# Patient Record
Sex: Male | Born: 1955 | Race: White | Hispanic: No | Marital: Married | Smoking: Former smoker
Health system: Southern US, Community
[De-identification: ages and names within clinical notes are randomized; demographics above are authoritative.]

## PROBLEM LIST (undated history)

## (undated) DIAGNOSIS — I350 Nonrheumatic aortic (valve) stenosis: Secondary | ICD-10-CM

## (undated) DIAGNOSIS — E785 Hyperlipidemia, unspecified: Secondary | ICD-10-CM

## (undated) DIAGNOSIS — M51369 Other intervertebral disc degeneration, lumbar region without mention of lumbar back pain or lower extremity pain: Secondary | ICD-10-CM

## (undated) DIAGNOSIS — I209 Angina pectoris, unspecified: Secondary | ICD-10-CM

## (undated) DIAGNOSIS — Z7982 Long term (current) use of aspirin: Secondary | ICD-10-CM

## (undated) DIAGNOSIS — E119 Type 2 diabetes mellitus without complications: Secondary | ICD-10-CM

## (undated) DIAGNOSIS — I219 Acute myocardial infarction, unspecified: Secondary | ICD-10-CM

## (undated) DIAGNOSIS — K439 Ventral hernia without obstruction or gangrene: Secondary | ICD-10-CM

## (undated) DIAGNOSIS — M5136 Other intervertebral disc degeneration, lumbar region: Secondary | ICD-10-CM

## (undated) DIAGNOSIS — G4733 Obstructive sleep apnea (adult) (pediatric): Secondary | ICD-10-CM

## (undated) DIAGNOSIS — I2510 Atherosclerotic heart disease of native coronary artery without angina pectoris: Secondary | ICD-10-CM

## (undated) DIAGNOSIS — I5189 Other ill-defined heart diseases: Secondary | ICD-10-CM

## (undated) DIAGNOSIS — I10 Essential (primary) hypertension: Secondary | ICD-10-CM

## (undated) DIAGNOSIS — F32A Depression, unspecified: Secondary | ICD-10-CM

## (undated) DIAGNOSIS — K219 Gastro-esophageal reflux disease without esophagitis: Secondary | ICD-10-CM

## (undated) DIAGNOSIS — M1990 Unspecified osteoarthritis, unspecified site: Secondary | ICD-10-CM

## (undated) DIAGNOSIS — N200 Calculus of kidney: Secondary | ICD-10-CM

## (undated) DIAGNOSIS — F419 Anxiety disorder, unspecified: Secondary | ICD-10-CM

## (undated) DIAGNOSIS — K5790 Diverticulosis of intestine, part unspecified, without perforation or abscess without bleeding: Secondary | ICD-10-CM

## (undated) DIAGNOSIS — Z9889 Other specified postprocedural states: Secondary | ICD-10-CM

## (undated) DIAGNOSIS — I700 Atherosclerosis of aorta: Secondary | ICD-10-CM

## (undated) DIAGNOSIS — Z973 Presence of spectacles and contact lenses: Secondary | ICD-10-CM

## (undated) DIAGNOSIS — K760 Fatty (change of) liver, not elsewhere classified: Secondary | ICD-10-CM

## (undated) DIAGNOSIS — R911 Solitary pulmonary nodule: Secondary | ICD-10-CM

## (undated) HISTORY — DX: Anxiety disorder, unspecified: F41.9

## (undated) HISTORY — DX: Type 2 diabetes mellitus without complications: E11.9

## (undated) HISTORY — PX: BACK SURGERY: SHX140

## (undated) HISTORY — DX: Atherosclerotic heart disease of native coronary artery without angina pectoris: I25.10

## (undated) HISTORY — DX: Other specified postprocedural states: Z98.890

## (undated) HISTORY — DX: Hyperlipidemia, unspecified: E78.5

## (undated) HISTORY — DX: Essential (primary) hypertension: I10

---

## 1976-01-13 HISTORY — PX: KNEE SURGERY: SHX244

## 1998-03-18 ENCOUNTER — Observation Stay (HOSPITAL_COMMUNITY): Admission: RE | Admit: 1998-03-18 | Discharge: 1998-03-19 | Payer: Self-pay | Admitting: Orthopedic Surgery

## 2001-01-12 DIAGNOSIS — Z9889 Other specified postprocedural states: Secondary | ICD-10-CM

## 2001-01-12 HISTORY — DX: Other specified postprocedural states: Z98.890

## 2001-01-12 HISTORY — PX: ANKLE FUSION: SHX881

## 2001-01-12 HISTORY — PX: CARDIAC CATHETERIZATION: SHX172

## 2001-03-03 HISTORY — PX: LEFT HEART CATH AND CORONARY ANGIOGRAPHY: CATH118249

## 2001-07-26 ENCOUNTER — Encounter: Payer: Self-pay | Admitting: Orthopedic Surgery

## 2001-07-27 ENCOUNTER — Inpatient Hospital Stay (HOSPITAL_COMMUNITY): Admission: EM | Admit: 2001-07-27 | Discharge: 2001-07-28 | Payer: Self-pay | Admitting: Orthopedic Surgery

## 2004-01-13 HISTORY — PX: CORONARY ANGIOPLASTY: SHX604

## 2004-11-19 DIAGNOSIS — Z955 Presence of coronary angioplasty implant and graft: Secondary | ICD-10-CM

## 2004-11-19 DIAGNOSIS — I2510 Atherosclerotic heart disease of native coronary artery without angina pectoris: Secondary | ICD-10-CM

## 2004-11-19 HISTORY — DX: Atherosclerotic heart disease of native coronary artery without angina pectoris: I25.10

## 2004-11-19 HISTORY — DX: Presence of coronary angioplasty implant and graft: Z95.5

## 2004-11-19 HISTORY — PX: CORONARY ANGIOPLASTY WITH STENT PLACEMENT: SHX49

## 2004-12-01 HISTORY — PX: CORONARY ANGIOPLASTY WITH STENT PLACEMENT: SHX49

## 2008-01-13 HISTORY — PX: NECK SURGERY: SHX720

## 2012-02-09 ENCOUNTER — Encounter: Payer: Self-pay | Admitting: Cardiovascular Disease

## 2012-02-09 ENCOUNTER — Ambulatory Visit (INDEPENDENT_AMBULATORY_CARE_PROVIDER_SITE_OTHER): Payer: BC Managed Care – PPO | Admitting: Cardiovascular Disease

## 2012-02-09 VITALS — BP 130/80 | HR 61 | Ht 71.5 in | Wt 205.0 lb

## 2012-02-09 DIAGNOSIS — Z9861 Coronary angioplasty status: Secondary | ICD-10-CM

## 2012-02-09 DIAGNOSIS — E119 Type 2 diabetes mellitus without complications: Secondary | ICD-10-CM | POA: Insufficient documentation

## 2012-02-09 DIAGNOSIS — E785 Hyperlipidemia, unspecified: Secondary | ICD-10-CM

## 2012-02-09 DIAGNOSIS — R079 Chest pain, unspecified: Secondary | ICD-10-CM | POA: Insufficient documentation

## 2012-02-09 DIAGNOSIS — I2510 Atherosclerotic heart disease of native coronary artery without angina pectoris: Secondary | ICD-10-CM | POA: Insufficient documentation

## 2012-02-09 DIAGNOSIS — Z955 Presence of coronary angioplasty implant and graft: Secondary | ICD-10-CM

## 2012-02-09 NOTE — Assessment & Plan Note (Signed)
Continue Crestor, goal LDL less than 70

## 2012-02-09 NOTE — Progress Notes (Signed)
Patient ID: Samuel Gentry, male    DOB: 06/12/1955, 57 y.o.   MRN: [id number]  HPI Comments: Samuel Gentry is a very pleasant 89 your gentleman with history of hypertension, diabetes , coronary artery disease, cardiac catheterization in 2003 showing 50% LAD disease, 50% left circumflex disease with medical management recommended at that time based on stress test showing no ischemia (following the cardiac catheterization), repeat cardiac catheterization in 2006 for symptoms of chest pain showing severe LAD, diagonal, circumflex and OM disease. He had stent x4 at [facility]. He presents to establish care  He reports having several weeks, possibly several months of stuttering chest pain, shortness of breath, dizziness. Dizzy episode started back in Thanksgiving 2013. Symptoms while standing lasting for several minutes. Occasionally he has "swimmy feeling" in his head. He has had chest pain recently. Typically he likes to exercise on a daily basis. Chest pain episodes over the past week or 2, especially today after walking the chest pain that did not resolve as quickly as it normally does. He almost took nitroglycerin for symptoms.  He reports that his diabetes is well controlled, cholesterol also well controlled EKG today shows normal sinus rhythm with rate 61 beats a minute, no significant ST or T wave changes       Outpatient Encounter Prescriptions as of 02/09/2012  Medication Sig Dispense Refill  . amLODipine (NORVASC) 10 MG tablet Take 10 mg by mouth daily.      Marland Kitchen aspirin 325 MG tablet Take 325 mg by mouth daily.      Marland Kitchen atenolol (TENORMIN) 50 MG tablet Take 50 mg by mouth daily.      . Multiple Vitamin (MULTIVITAMIN) tablet Take 1 tablet by mouth daily.      . nitroGLYCERIN (NITROSTAT) 0.4 MG SL tablet Place 0.4 mg under the tongue every 5 (five) minutes as needed.      . rosuvastatin (CRESTOR) 40 MG tablet Take 20 mg by mouth daily.       . sitaGLIPtan-metformin (JANUMET)  50-1000 MG per tablet Take 1 tablet by mouth 2 (two) times daily with a meal.      . isosorbide dinitrate (ISORDIL) 20 MG tablet Take 20 mg by mouth 2 (two) times daily.        Review of Systems  Constitutional: Negative.   HENT: Negative.   Eyes: Negative.   Respiratory: Positive for shortness of breath.   Cardiovascular: Positive for chest pain.  Gastrointestinal: Negative.   Musculoskeletal: Negative.   Skin: Negative.   Neurological: Positive for dizziness.  Hematological: Negative.   Psychiatric/Behavioral: Negative.   All other systems reviewed and are negative.     BP 130/80  Pulse 61  Ht 5' 11.5" (1.816 m)  Wt 205 lb (92.987 kg)  BMI 28.19 kg/m2  Physical Exam  Nursing note and vitals reviewed. Constitutional: He is oriented to person, place, and time. He appears well-developed and well-nourished.  HENT:  Head: Normocephalic.  Nose: Nose normal.  Mouth/Throat: Oropharynx is clear and moist.  Eyes: Conjunctivae normal are normal. Pupils are equal, round, and reactive to light.  Neck: Normal range of motion. Neck supple. No JVD present.  Cardiovascular: Normal rate, regular rhythm, S1 normal, S2 normal, normal heart sounds and intact distal pulses.  Exam reveals no gallop and no friction rub.   No murmur heard. Pulmonary/Chest: Effort normal and breath sounds normal. No respiratory distress. He has no wheezes. He has no rales. He exhibits no tenderness.  Abdominal: Soft.  Bowel sounds are normal. He exhibits no distension. There is no tenderness.  Musculoskeletal: Normal range of motion. He exhibits no edema and no tenderness.  Lymphadenopathy:    He has no cervical adenopathy.  Neurological: He is alert and oriented to person, place, and time. Coordination normal.  Skin: Skin is warm and dry. No rash noted. No erythema.  Psychiatric: He has a normal mood and affect. His behavior is normal. Judgment and thought content normal.           Assessment and Plan

## 2012-02-09 NOTE — Assessment & Plan Note (Addendum)
Chest pain concerning for angina. He is on aspirin, statin, beta blocker. He has headaches with long acting isosorbide and has not started this yet. He would take nitroglycerin sublingual as needed. Stress test pending.

## 2012-02-09 NOTE — Assessment & Plan Note (Signed)
We have encouraged continued exercise, careful diet management in an effort to lose weight. 

## 2012-02-09 NOTE — Assessment & Plan Note (Addendum)
Coronary disease dating back to catheterization in 2003, stent placement in 2006. Now with chest pain, neck pain concerning for unstable angina. We have discussed the various options with him. He would like to discuss this with his wife but we'll schedule a stress test. This is what he would prefer at this time. We have suggested he use nitroglycerin for any worsening chest pain. Stress test will be on February 5

## 2012-02-09 NOTE — Patient Instructions (Addendum)
No medication changes were made.  We will schedule a stress test on 02/17/2012 at 11:15 Am Please hold amlodipine, janumet, and atenolol the night before and morning of the test  Call the office if you have worsening chest pain  Please call us if you have new issues that need to be addressed before your next appt.

## 2012-02-09 NOTE — Assessment & Plan Note (Addendum)
Images that he brings with him shows 2 stents to the LAD and diagonal, 2 stents to the left circumflex and OM in 2006

## 2012-02-12 ENCOUNTER — Other Ambulatory Visit: Payer: Self-pay

## 2012-02-12 DIAGNOSIS — R079 Chest pain, unspecified: Secondary | ICD-10-CM

## 2012-02-17 ENCOUNTER — Telehealth: Payer: Self-pay

## 2012-02-17 NOTE — Telephone Encounter (Signed)
LMTCB

## 2012-02-17 NOTE — Telephone Encounter (Signed)
Wife called very upset about this issue, stating "if my husband has a heart attack, there WILL BE a lawsuit headed your way!"  I explained that I understood her frustration and would be upset as well.  She says "something needs to change" and continues to tell me how furious she is.  i told her I have tried calling pt but had to leave a message for him to call me back. I told her I would try calling him again.

## 2012-02-17 NOTE — Telephone Encounter (Signed)
I was able to speak with pt, who says he is willing to r/s test until tomm at 1115-1:30. I apologized again for this issue and inconvenience.  I offered to r/s at [facility]. He wishes to keep his care with Dr. Mariah Milling and [facility]. R/S stress test for tomm at 1130, pt check in time at 1115 I advised him to hold janumet tomm am as well as atenolol. Understanding verb He also tells me he has been hurting a lot in neck and chest over the past few days. He has not tried NTG for this. I advised him ok to take. I will also inform Dr. Mariah Milling of his pain and will call him back with further instructions if needed.

## 2012-02-17 NOTE — Telephone Encounter (Signed)
I discussed pt's symptoms with Dr. Mariah Milling. He asks that I advise pt to go head with heart cath versus stress test. Says we could do heart cath tomm if he chooses to do so. I called pt to inform him of Dr. Windell Hummingbird advice. He verb. Understanding but still wants to go ahead with stress test instead of cath.  I advised him to use SL NTG PRN CP between now and tomm and to go to ER should pain get worse/go unrelieved with SL NTG. Pt verb. Understanding.

## 2012-02-17 NOTE — Telephone Encounter (Signed)
I received t/c from [facility] nuclear dept Pt was to have stress test at 1130 this am but nuclear dept had him scheduled for "730", so they thought he no showed Pt showed up at 1115 as we instructed him I explained to [facility] in Nuclear dept that when I called to schedule this last week with Annabelle Harman in scheduling, she tried getting special permission from nucs to schedule at this time but no one was in nuclear dept at that time She told me she would call them next day to give them correct time This was never communicated to them/nuclear dept  Tresa Endo says she could have pharmacy bring new dose at 1:30 if Dr. Mariah Milling is able to do stress portion at 2/230 today Per Dr. Mariah Milling, he is unable to do this since he has patients in clinic this pm  Tresa Endo went to explain situation to pt and says pt was very upset, stating that he is never coming back and that "i will just die of a heart attack". Tresa Endo tried explaining to pt they are still working to get his test done today, but pt left [facility].  I am going to call pt to discuss with him further.

## 2012-02-18 ENCOUNTER — Ambulatory Visit: Payer: Self-pay | Admitting: Cardiovascular Disease

## 2012-02-18 DIAGNOSIS — R079 Chest pain, unspecified: Secondary | ICD-10-CM

## 2012-03-02 ENCOUNTER — Other Ambulatory Visit: Payer: Self-pay | Admitting: *Deleted

## 2012-03-02 DIAGNOSIS — R079 Chest pain, unspecified: Secondary | ICD-10-CM

## 2013-05-17 LAB — LIPID PANEL
Cholesterol: 161 mg/dL (ref 0–200)
HDL: 45 mg/dL (ref 35–70)
LDL CALC: 94 mg/dL
LDL/HDL RATIO: 2.1
Triglycerides: 111 mg/dL (ref 40–160)

## 2013-05-17 LAB — BASIC METABOLIC PANEL
BUN: 27 mg/dL — AB (ref 4–21)
CREATININE: 1.2 mg/dL (ref <=1.3)
Glucose: 141 mg/dL
POTASSIUM: 4.6 mmol/L (ref 3.4–5.3)
Sodium: 138 mmol/L (ref 137–147)

## 2013-05-17 LAB — CBC AND DIFFERENTIAL
HCT: 42 % (ref 41–53)
HEMOGLOBIN: 15 g/dL (ref 13.5–17.5)
Neutrophils Absolute: 2 /uL
Platelets: 219 10*3/uL (ref 150–399)
WBC: 5.1 10^3/mL

## 2013-05-17 LAB — HEPATIC FUNCTION PANEL
ALT: 43 U/L — AB (ref 10–40)
AST: 41 U/L — AB (ref 14–40)
Alkaline Phosphatase: 68 U/L (ref 25–125)
Bilirubin, Total: 0.4 mg/dL

## 2013-05-17 LAB — HM PAP SMEAR: HM PAP: 2

## 2013-12-20 ENCOUNTER — Other Ambulatory Visit: Payer: Self-pay | Admitting: Orthopedic Surgery

## 2013-12-27 ENCOUNTER — Encounter (HOSPITAL_BASED_OUTPATIENT_CLINIC_OR_DEPARTMENT_OTHER)
Admission: RE | Admit: 2013-12-27 | Discharge: 2013-12-27 | Disposition: A | Discharge status: Home / Self Care | Payer: 59 | Source: Ambulatory Visit | Attending: Orthopedic Surgery | Admitting: Orthopedic Surgery

## 2013-12-27 ENCOUNTER — Encounter (HOSPITAL_BASED_OUTPATIENT_CLINIC_OR_DEPARTMENT_OTHER): Payer: Self-pay | Admitting: *Deleted

## 2013-12-27 DIAGNOSIS — M5136 Other intervertebral disc degeneration, lumbar region: Secondary | ICD-10-CM | POA: Diagnosis not present

## 2013-12-27 DIAGNOSIS — Z87891 Personal history of nicotine dependence: Secondary | ICD-10-CM | POA: Diagnosis not present

## 2013-12-27 DIAGNOSIS — I2510 Atherosclerotic heart disease of native coronary artery without angina pectoris: Secondary | ICD-10-CM | POA: Diagnosis not present

## 2013-12-27 DIAGNOSIS — M654 Radial styloid tenosynovitis [de Quervain]: Secondary | ICD-10-CM | POA: Diagnosis not present

## 2013-12-27 DIAGNOSIS — K219 Gastro-esophageal reflux disease without esophagitis: Secondary | ICD-10-CM | POA: Diagnosis not present

## 2013-12-27 DIAGNOSIS — E785 Hyperlipidemia, unspecified: Secondary | ICD-10-CM | POA: Diagnosis not present

## 2013-12-27 DIAGNOSIS — Z01818 Encounter for other preprocedural examination: Secondary | ICD-10-CM | POA: Diagnosis not present

## 2013-12-27 DIAGNOSIS — I10 Essential (primary) hypertension: Secondary | ICD-10-CM | POA: Diagnosis not present

## 2013-12-27 DIAGNOSIS — Z8349 Family history of other endocrine, nutritional and metabolic diseases: Secondary | ICD-10-CM | POA: Diagnosis not present

## 2013-12-27 DIAGNOSIS — E119 Type 2 diabetes mellitus without complications: Secondary | ICD-10-CM | POA: Diagnosis not present

## 2013-12-27 LAB — BASIC METABOLIC PANEL
Anion gap: 12 (ref 5–15)
BUN: 38 mg/dL — ABNORMAL HIGH (ref 6–23)
CHLORIDE: 98 meq/L (ref 96–112)
CO2: 25 mEq/L (ref 19–32)
Calcium: 9.8 mg/dL (ref 8.4–10.5)
Creatinine, Ser: 1.02 mg/dL (ref 0.50–1.35)
GFR calc non Af Amer: 79 mL/min — ABNORMAL LOW (ref >90)
Glucose, Bld: 124 mg/dL — ABNORMAL HIGH (ref 70–99)
POTASSIUM: 4.6 meq/L (ref 3.7–5.3)
Sodium: 135 mEq/L — ABNORMAL LOW (ref 137–147)

## 2013-12-27 NOTE — Progress Notes (Signed)
Pt had stents in heart 2006-saw dr Jose Persiagollum until 2/14-last stress test normal-has not had any cardiac problems since-only sees pcp now-lives Samuel Gentry. [place] in [place]-will come in for [employer]

## 2013-12-27 NOTE — Progress Notes (Signed)
   12/27/13 1319  OBSTRUCTIVE SLEEP APNEA  Have you ever been diagnosed with sleep apnea through a sleep study? No  Do you snore loudly (loud enough to be heard through closed doors)?  0  Do you often feel tired, fatigued, or sleepy during the daytime? 0  Has anyone observed you stop breathing during your sleep? 0  Do you have, or are you being treated for high blood pressure? 1  BMI more than 35 kg/m2? 0  Age over 867 years old? 1  Neck circumference greater than 40 cm/16 inches? 1  Gender: 1  Obstructive Sleep Apnea Score 4  Score 4 or greater  Results sent to PCP

## 2013-12-28 NOTE — H&P (Signed)
Samuel Gentry is an 58 y.o. male.   CC / Reason for Visit: Left wrist pain HPI: This patient returns for reevaluation.  He reports that he continues to take meloxicam, uses the brace at work, and his situation is much improved, but not fully resolved following the injection on 10-04-13.  He is better than he was following the injection therapy last year.  He continues to do his exercises regularly as well.    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  He reports that he was evaluated by Dr. Tamala Julian last year, who provided a steroid injection and provided a splint.  He reports that the administration of the injection was quite uncomfortable.  Although he had some transient relief, and never went fully away and now has really become much more problematic last couple of months.  He has been taking some Tylenol, but has never been taught any specific exercises.  Past Medical History  Diagnosis Date  . Type II or unspecified type diabetes mellitus without mention of complication, not stated as uncontrolled   . Other and unspecified hyperlipidemia   . Unspecified essential hypertension   . Status post cardiac catheterization 2003    [facility]  . Coronary atherosclerosis of native coronary artery 11/19/2004    2 stents placed to the LAD in [place], [place].   . Wears glasses   . GERD (gastroesophageal reflux disease)   . Arthritis   . DDD (degenerative disc disease), lumbar     Past Surgical History  Procedure Laterality Date  . Knee surgery  1978    rt  . Neck surgery  2010  . Ankle fusion  2003    left ankle  . Cardiac catheterization  2003  . Coronary angioplasty  2006  . Back surgery  93,97    lumb x2    Family History  Problem Relation Age of Onset  . Hyperlipidemia Mother    Social History:  reports that he quit smoking about 31 years ago. His smoking use included Cigarettes. He has a 16  pack-year smoking history. He does not have any smokeless tobacco history on file. He reports that he does not drink alcohol or use illicit drugs.  Allergies: No Known Allergies  No prescriptions prior to admission    Results for orders placed or performed during the hospital encounter of 12/29/13 (from the past 48 hour(s))  Basic metabolic panel     Status: Abnormal   Collection Time: 12/27/13  3:30 PM  Result Value Ref Range   Sodium 135 (L) 137 - 147 mEq/L   Potassium 4.6 3.7 - 5.3 mEq/L   Chloride 98 96 - 112 mEq/L   CO2 25 19 - 32 mEq/L   Glucose, Bld 124 (H) 70 - 99 mg/dL   BUN 38 (H) 6 - 23 mg/dL   Creatinine, Ser 1.02 0.50 - 1.35 mg/dL   Calcium 9.8 8.4 - 10.5 mg/dL   GFR calc non Af Amer 79 (L) >90 mL/min   GFR calc Af Amer >90 >90 mL/min    Comment: (NOTE) The eGFR has been calculated using the CKD EPI equation. This calculation has not been validated in all clinical situations. eGFR's persistently <90 mL/min signify possible Chronic Kidney Disease.    Anion gap 12 5 - 15   No results found.  Review of Systems  All other systems reviewed and are negative.   Height 5' 11.5" (  1.816 m), weight 88.451 kg (195 lb). Physical Exam  Constitutional:  WD, WN, NAD HEENT:  NCAT, EOMI Neuro/Psych:  Alert & oriented to person, place, and time; appropriate mood & affect Lymphatic: No generalized UE edema or lymphadenopathy Extremities / MSK:  Both UE are normal with respect to appearance, ranges of motion, joint stability, muscle strength/tone, sensation, & perfusion except as otherwise noted:  The left wrist is tender over the first dorsal compartment tendons, exacerbated by Wynn Maudlin and EPB stress.  No pain with palpation at the [facility] joint or with [facility] stress/grind testing.  Labs / Xrays: No radiographic studies obtained today.  Assessment:  Left wrist de Quervain's tenosynovitis-failed nonop mgmnt  Plan:  We discussed the the details of de Quervain's release.  The  details of the operative procedure were discussed with the patient.  Questions were invited and answered.  In addition to the goal of the procedure, the risks of the procedure to include but not limited to bleeding; infection; damage to the nerves or blood vessels that could result in bleeding, numbness, weakness, chronic pain, and the need for additional procedures; stiffness; the need for revision surgery; and anesthetic risks, were reviewed.  No specific outcome was guaranteed or implied.  Informed consent was obtained.   Tiera Mensinger A. 12/28/2013, 11:40 AM

## 2013-12-29 ENCOUNTER — Ambulatory Visit (HOSPITAL_BASED_OUTPATIENT_CLINIC_OR_DEPARTMENT_OTHER)
Admission: RE | Admit: 2013-12-29 | Discharge: 2013-12-29 | Disposition: A | Discharge status: Home / Self Care | Payer: 59 | Source: Ambulatory Visit | Attending: Orthopedic Surgery | Admitting: Orthopedic Surgery

## 2013-12-29 ENCOUNTER — Ambulatory Visit (HOSPITAL_BASED_OUTPATIENT_CLINIC_OR_DEPARTMENT_OTHER): Payer: 59 | Admitting: Anesthesiology

## 2013-12-29 ENCOUNTER — Encounter (HOSPITAL_BASED_OUTPATIENT_CLINIC_OR_DEPARTMENT_OTHER)
Admission: RE | Disposition: A | Discharge status: Home / Self Care | Payer: Self-pay | Source: Ambulatory Visit | Attending: Orthopedic Surgery

## 2013-12-29 ENCOUNTER — Encounter (HOSPITAL_BASED_OUTPATIENT_CLINIC_OR_DEPARTMENT_OTHER): Payer: Self-pay | Admitting: *Deleted

## 2013-12-29 DIAGNOSIS — I2510 Atherosclerotic heart disease of native coronary artery without angina pectoris: Secondary | ICD-10-CM | POA: Insufficient documentation

## 2013-12-29 DIAGNOSIS — E119 Type 2 diabetes mellitus without complications: Secondary | ICD-10-CM | POA: Insufficient documentation

## 2013-12-29 DIAGNOSIS — Z87891 Personal history of nicotine dependence: Secondary | ICD-10-CM | POA: Insufficient documentation

## 2013-12-29 DIAGNOSIS — M654 Radial styloid tenosynovitis [de Quervain]: Secondary | ICD-10-CM | POA: Insufficient documentation

## 2013-12-29 DIAGNOSIS — K219 Gastro-esophageal reflux disease without esophagitis: Secondary | ICD-10-CM | POA: Insufficient documentation

## 2013-12-29 DIAGNOSIS — E785 Hyperlipidemia, unspecified: Secondary | ICD-10-CM | POA: Insufficient documentation

## 2013-12-29 DIAGNOSIS — Z8349 Family history of other endocrine, nutritional and metabolic diseases: Secondary | ICD-10-CM | POA: Insufficient documentation

## 2013-12-29 DIAGNOSIS — I10 Essential (primary) hypertension: Secondary | ICD-10-CM | POA: Insufficient documentation

## 2013-12-29 DIAGNOSIS — Z955 Presence of coronary angioplasty implant and graft: Secondary | ICD-10-CM | POA: Insufficient documentation

## 2013-12-29 DIAGNOSIS — M1990 Unspecified osteoarthritis, unspecified site: Secondary | ICD-10-CM | POA: Insufficient documentation

## 2013-12-29 HISTORY — DX: Presence of spectacles and contact lenses: Z97.3

## 2013-12-29 HISTORY — DX: Other intervertebral disc degeneration, lumbar region: M51.36

## 2013-12-29 HISTORY — PX: DORSAL COMPARTMENT RELEASE: SHX5039

## 2013-12-29 HISTORY — DX: Other intervertebral disc degeneration, lumbar region without mention of lumbar back pain or lower extremity pain: M51.369

## 2013-12-29 HISTORY — DX: Unspecified osteoarthritis, unspecified site: M19.90

## 2013-12-29 HISTORY — DX: Gastro-esophageal reflux disease without esophagitis: K21.9

## 2013-12-29 LAB — GLUCOSE, CAPILLARY
GLUCOSE-CAPILLARY: 124 mg/dL — AB (ref 70–99)
Glucose-Capillary: 125 mg/dL — ABNORMAL HIGH (ref 70–99)

## 2013-12-29 LAB — POCT HEMOGLOBIN-HEMACUE: HEMOGLOBIN: 14.9 g/dL (ref 13.0–17.0)

## 2013-12-29 SURGERY — RELEASE, FIRST DORSAL COMPARTMENT, HAND
Anesthesia: Monitor Anesthesia Care | Laterality: Left

## 2013-12-29 MED ORDER — CEFAZOLIN SODIUM-DEXTROSE 2-3 GM-% IV SOLR
2.0000 g | INTRAVENOUS | Status: AC
  Administered 2013-12-29: 2 g via INTRAVENOUS

## 2013-12-29 MED ORDER — BUPIVACAINE-EPINEPHRINE (PF) 0.5% -1:200000 IJ SOLN
INTRAMUSCULAR | Status: AC
  Filled 2013-12-29: qty 30

## 2013-12-29 MED ORDER — FENTANYL CITRATE 0.05 MG/ML IJ SOLN: 50.0000 ug | INTRAMUSCULAR | Status: DC | PRN

## 2013-12-29 MED ORDER — CEFAZOLIN SODIUM-DEXTROSE 2-3 GM-% IV SOLR
INTRAVENOUS | Status: AC
  Filled 2013-12-29: qty 50

## 2013-12-29 MED ORDER — MIDAZOLAM HCL 2 MG/2ML IJ SOLN
INTRAMUSCULAR | Status: AC
  Filled 2013-12-29: qty 2

## 2013-12-29 MED ORDER — LIDOCAINE HCL (PF) 1 % IJ SOLN
INTRAMUSCULAR | Status: AC
  Filled 2013-12-29: qty 30

## 2013-12-29 MED ORDER — LIDOCAINE HCL (PF) 1 % IJ SOLN
INTRAMUSCULAR | Status: DC | PRN
  Administered 2013-12-29: 5 mL

## 2013-12-29 MED ORDER — PROPOFOL INFUSION 10 MG/ML OPTIME
INTRAVENOUS | Status: DC | PRN
  Administered 2013-12-29: 100 ug/kg/min via INTRAVENOUS

## 2013-12-29 MED ORDER — FENTANYL CITRATE 0.05 MG/ML IJ SOLN
INTRAMUSCULAR | Status: AC
  Filled 2013-12-29: qty 4

## 2013-12-29 MED ORDER — HYDROMORPHONE HCL 1 MG/ML IJ SOLN: 0.2500 mg | INTRAMUSCULAR | Status: DC | PRN

## 2013-12-29 MED ORDER — LIDOCAINE-EPINEPHRINE 1 %-1:100000 IJ SOLN
INTRAMUSCULAR | Status: AC
  Filled 2013-12-29: qty 1

## 2013-12-29 MED ORDER — LACTATED RINGERS IV SOLN
INTRAVENOUS | Status: DC
  Administered 2013-12-29: 13:00:00 via INTRAVENOUS

## 2013-12-29 MED ORDER — BUPIVACAINE-EPINEPHRINE 0.5% -1:200000 IJ SOLN
INTRAMUSCULAR | Status: DC | PRN
  Administered 2013-12-29: 5 mL

## 2013-12-29 MED ORDER — MIDAZOLAM HCL 2 MG/2ML IJ SOLN: 1.0000 mg | INTRAMUSCULAR | Status: DC | PRN

## 2013-12-29 MED ORDER — ONDANSETRON HCL 4 MG/2ML IJ SOLN
INTRAMUSCULAR | Status: DC | PRN
  Administered 2013-12-29: 4 mg via INTRAVENOUS

## 2013-12-29 MED ORDER — FENTANYL CITRATE 0.05 MG/ML IJ SOLN
INTRAMUSCULAR | Status: DC | PRN
  Administered 2013-12-29: 100 ug via INTRAVENOUS

## 2013-12-29 MED ORDER — PROMETHAZINE HCL 25 MG/ML IJ SOLN: 6.2500 mg | INTRAMUSCULAR | Status: DC | PRN

## 2013-12-29 MED ORDER — TOBRAMYCIN-DEXAMETHASONE 0.3-0.1 % OP OINT
TOPICAL_OINTMENT | OPHTHALMIC | Status: AC
  Filled 2013-12-29: qty 10.5

## 2013-12-29 MED ORDER — OXYCODONE-ACETAMINOPHEN 5-325 MG PO TABS: 1.0000 | ORAL_TABLET | ORAL | Status: DC | PRN

## 2013-12-29 MED ORDER — LIDOCAINE HCL (CARDIAC) 20 MG/ML IV SOLN
INTRAVENOUS | Status: DC | PRN
  Administered 2013-12-29: 60 mg via INTRAVENOUS

## 2013-12-29 MED ORDER — BSS IO SOLN
INTRAOCULAR | Status: AC
  Filled 2013-12-29: qty 45

## 2013-12-29 MED ORDER — MIDAZOLAM HCL 5 MG/5ML IJ SOLN
INTRAMUSCULAR | Status: DC | PRN
  Administered 2013-12-29: 2 mg via INTRAVENOUS

## 2013-12-29 MED ORDER — BACITRACIN-POLYMYXIN B 500-10000 UNIT/GM OP OINT
TOPICAL_OINTMENT | OPHTHALMIC | Status: AC
  Filled 2013-12-29: qty 3.5

## 2013-12-29 MED ORDER — NAPROXEN 500 MG PO TABS: 500.0000 mg | ORAL_TABLET | Freq: Two times a day (BID) | ORAL | Status: DC

## 2013-12-29 SURGICAL SUPPLY — 44 items
BLADE MINI RND TIP GREEN BEAV (BLADE) IMPLANT
BLADE SURG 15 STRL LF DISP TIS (BLADE) ×1 IMPLANT
BLADE SURG 15 STRL SS (BLADE) ×1
BNDG COHESIVE 4X5 TAN STRL (GAUZE/BANDAGES/DRESSINGS) ×2 IMPLANT
BNDG ESMARK 4X9 LF (GAUZE/BANDAGES/DRESSINGS) IMPLANT
BNDG GAUZE ELAST 4 BULKY (GAUZE/BANDAGES/DRESSINGS) ×4 IMPLANT
CHLORAPREP W/TINT 26ML (MISCELLANEOUS) ×2 IMPLANT
COVER BACK TABLE 60X90IN (DRAPES) ×2 IMPLANT
COVER MAYO STAND STRL (DRAPES) ×2 IMPLANT
CUFF TOURNIQUET SINGLE 18IN (TOURNIQUET CUFF) IMPLANT
DRAPE EXTREMITY T 121X128X90 (DRAPE) ×2 IMPLANT
DRAPE SURG 17X23 STRL (DRAPES) ×2 IMPLANT
DRSG EMULSION OIL 3X3 NADH (GAUZE/BANDAGES/DRESSINGS) ×2 IMPLANT
GLOVE BIO SURGEON STRL SZ7.5 (GLOVE) ×2 IMPLANT
GLOVE BIOGEL M 7.0 STRL (GLOVE) ×2 IMPLANT
GLOVE BIOGEL PI IND STRL 7.0 (GLOVE) ×1 IMPLANT
GLOVE BIOGEL PI IND STRL 8 (GLOVE) ×1 IMPLANT
GLOVE BIOGEL PI INDICATOR 7.0 (GLOVE) ×1
GLOVE BIOGEL PI INDICATOR 8 (GLOVE) ×1
GLOVE ECLIPSE 6.5 STRL STRAW (GLOVE) ×4 IMPLANT
GOWN STRL REUS W/ TWL LRG LVL3 (GOWN DISPOSABLE) ×2 IMPLANT
GOWN STRL REUS W/TWL LRG LVL3 (GOWN DISPOSABLE) ×2
GOWN STRL REUS W/TWL XL LVL3 (GOWN DISPOSABLE) ×2 IMPLANT
NDL SAFETY ECLIPSE 18X1.5 (NEEDLE) IMPLANT
NEEDLE HYPO 18GX1.5 SHARP (NEEDLE)
NEEDLE HYPO 25X1 1.5 SAFETY (NEEDLE) IMPLANT
NS IRRIG 1000ML POUR BTL (IV SOLUTION) ×2 IMPLANT
PACK BASIN DAY SURGERY FS (CUSTOM PROCEDURE TRAY) ×2 IMPLANT
PADDING CAST ABS 4INX4YD NS (CAST SUPPLIES) ×1
PADDING CAST ABS COTTON 4X4 ST (CAST SUPPLIES) ×1 IMPLANT
SPLINT PLASTER CAST XFAST 3X15 (CAST SUPPLIES) ×7 IMPLANT
SPLINT PLASTER XTRA FASTSET 3X (CAST SUPPLIES) ×7
SPONGE GAUZE 4X4 12PLY STER LF (GAUZE/BANDAGES/DRESSINGS) ×2 IMPLANT
STOCKINETTE 6  STRL (DRAPES) ×1
STOCKINETTE 6 STRL (DRAPES) ×1 IMPLANT
SUT VIC AB 4-0 P-3 18XBRD (SUTURE) ×1 IMPLANT
SUT VIC AB 4-0 P3 18 (SUTURE) ×1
SUT VICRYL RAPIDE 4-0 (SUTURE) ×2 IMPLANT
SUT VICRYL RAPIDE 4/0 PS 2 (SUTURE) ×2 IMPLANT
SYR BULB 3OZ (MISCELLANEOUS) IMPLANT
SYRINGE 10CC LL (SYRINGE) IMPLANT
TOWEL OR 17X24 6PK STRL BLUE (TOWEL DISPOSABLE) ×2 IMPLANT
TOWEL OR NON WOVEN STRL DISP B (DISPOSABLE) ×2 IMPLANT
UNDERPAD 30X30 INCONTINENT (UNDERPADS AND DIAPERS) ×2 IMPLANT

## 2013-12-29 NOTE — Op Note (Signed)
12/29/2013  12:56 PM  PATIENT:  Samuel Gentry Pyatt  58 y.o. male  PRE-OPERATIVE DIAGNOSIS:  Left wrist de Quervain's tenosynovitis  POST-OPERATIVE DIAGNOSIS:  Same  PROCEDURE:  Left wrist first dorsal compartment plasty (de Quervain's release)  SURGEON: Cliffton Astersavid A. Janee Mornhompson, MD  PHYSICIAN ASSISTANT: None  ANESTHESIA:  local and MAC  SPECIMENS:  None  DRAINS:   None  EBL:  less than 50 mL  PREOPERATIVE INDICATIONS:  Samuel Gentry Spanos is a  58 y.o. male with recalcitrant left wrist de Quervain's tenosynovitis, despite nonoperative management.  The risks benefits and alternatives were discussed with the patient preoperatively including but not limited to the risks of infection, bleeding, nerve injury, cardiopulmonary complications, the need for revision surgery, among others, and the patient verbalized understanding and consented to proceed.  OPERATIVE IMPLANTS: None  OPERATIVE PROCEDURE:  After receiving prophylactic antibiotics, the patient was escorted to the operative theatre and placed in a supine position.  The planned incision was marked and anesthetized with a mixture of lidocaine and Marcaine bearing epinephrine, and he was sedated. A surgical "time-out" was performed during which the planned procedure, proposed operative site, and the correct patient identity were compared to the operative consent and agreement confirmed by the circulating nurse according to current facility policy.  Following application of a tourniquet to the operative extremity, the exposed skin was prepped with Chloraprep and draped in the usual sterile fashion.  The limb was exsanguinated with an Esmarch bandage and the tourniquet inflated to approximately 100mmHg higher than systolic BP.  An oblique incision was made over the distal radial aspect of the wrist. Care was taken to spreading dissection to identify and protect the retraction the superficial radial nerve. The first dorsal compartment was identified,  and the sheath opened in a Z fashion to allow for later reapproximation and expanded more open position. It was opened for several centimeters proximal and distal to the actual thickening of the extensor retinaculum. All of the tendons were found to be within one large compartment, without a significant subcutaneous compartment for the EPB. However, the first dorsal compartment was very tight, and there was extensive tenosynovial hyperproliferation/thickening that was debrided with scissor dissection. The tenosynovium did not invade the tendon however. The tourniquet was then released, the wound irrigated, and the expanded flaps of the Z-plasty of the sheath repaired with 4-0 Vicryl. The skin was then reapproximated with 4-0 Vicryl Rapide in a running horizontal mattress fashion and a short arm splint with a volar plaster component was applied. He was taken to recovery room in stable condition.  DISPOSITION: He'll be discharged home today, with instructions to remove the splint dressing in 72 hours and begin gentle wrist range of motion exercises and skin mobility exercises. He should return to his removable wrist splint during this time and will follow-up in the office the week of January 4.

## 2013-12-29 NOTE — Interval H&P Note (Signed)
History and Physical Interval Note:  12/29/2013 12:51 PM  Samuel MassedStephen W Gentry  has presented today for surgery, with the diagnosis of left wrist dequervain tenosynovitis  The various methods of treatment have been discussed with the patient and family. After consideration of risks, benefits and other options for treatment, the patient has consented to  Procedure(s): LEFT WRIST RELEASE DORSAL COMPARTMENT (DEQUERVAIN) (Left) as a surgical intervention .  The patient's history has been reviewed, patient examined, no change in status, stable for surgery.  I have reviewed the patient's chart and labs.  Questions were answered to the patient's satisfaction.     [name] A.

## 2013-12-29 NOTE — Transfer of Care (Addendum)
Immediate Anesthesia Transfer of Care Note  Patient: Samuel Gentry  Procedure(s) Performed: Procedure(s): LEFT WRIST RELEASE DORSAL COMPARTMENT (DEQUERVAIN) (Left)  Patient Location: PACU  Anesthesia Type:MAC  Level of Consciousness: awake, alert  and oriented  Airway & Oxygen Therapy: Patient Spontanous Breathing  Post-op Assessment: Report given to PACU RN and Post -op Vital signs reviewed and stable  Post vital signs: Reviewed and stable  Complications: No apparent anesthesia complications

## 2013-12-29 NOTE — Anesthesia Postprocedure Evaluation (Signed)
Anesthesia Post Note  Patient: Samuel Gentry  Procedure(s) Performed: Procedure(s) (LRB): LEFT WRIST RELEASE DORSAL COMPARTMENT (DEQUERVAIN) (Left)  Anesthesia type: MAC  Patient location: PACU  Post pain: Pain level controlled  Post assessment: Patient's Cardiovascular Status Stable  Last Vitals:  Filed Vitals:   12/29/13 1427  BP: 114/75  Pulse: 55  Temp: 36.7 C  Resp: 16    Post vital signs: Reviewed and stable  Level of consciousness: sedated  Complications: No apparent anesthesia complications

## 2013-12-29 NOTE — Discharge Instructions (Signed)
Discharge Instructions   You have a dressing with a plaster splint incorporated in it. Move your fingers as much as possible, making a full fist and fully opening the fist. Elevate your hand to reduce pain & swelling of the digits.  Ice over the operative site may be helpful to reduce pain & swelling.  DO NOT USE HEAT. Pain medicine has been prescribed for you.  Use your medicine as needed over the first 48 hours, and then you can begin to taper your use.  You may use Tylenol in place of your prescribed pain medication, but not IN ADDITION to it. Leave the dressing in place for 3 days, then you may remove it and go back into your removeable wrist splint.  At that time, also begin to work on gentle wrist range-of-motion exercises and SKIN MOBILITY EXERCISES AS I SHOWED YOU ON THE DAY OF SURGERY You may shower, but keep the bandage clean & dry.   After removing the original bandage, you may shower normally. You may drive a car when you are off of prescription pain medications and can safely control your vehicle with both hands.   Please call [phone] during normal business hours or [phone] after hours for any problems. Including the following:  - excessive redness of the incisions - drainage for more than 4 days - fever of more than 101.5 F  *Please note that pain medications will not be refilled after hours or on weekends.  WORK STATUS:  YOU MAY RETURN TO WORK ON Monday, 12-21, BUT USING YOUR REMOVEABLE WRIST BRACE AND WITH NO LIFTING, GRIPPING, OR GRASPING WITH YOUR LEFT HAND MORE THAN 5 POUNDS.   Post Anesthesia Home Care Instructions  Activity: Get plenty of rest for the remainder of the day. A responsible adult should stay with you for 24 hours following the procedure.  For the next 24 hours, DO NOT: -Drive a car -Advertising copywriterperate machinery -Drink alcoholic beverages -Take any medication unless instructed by your physician -Make any legal decisions or sign important  papers.  Meals: Start with liquid foods such as gelatin or soup. Progress to regular foods as tolerated. Avoid greasy, spicy, heavy foods. If nausea and/or vomiting occur, drink only clear liquids until the nausea and/or vomiting subsides. Call your physician if vomiting continues.  Special Instructions/Symptoms: Your throat may feel dry or sore from the anesthesia or the breathing tube placed in your throat during surgery. If this causes discomfort, gargle with warm salt water. The discomfort should disappear within 24 hours.

## 2013-12-29 NOTE — Anesthesia Procedure Notes (Signed)
Procedure Name: MAC Performed by: Davelle Anselmi W Pre-anesthesia Checklist: Patient identified, Timeout performed, Emergency Drugs available, Suction available and Patient being monitored Patient Re-evaluated:Patient Re-evaluated prior to inductionOxygen Delivery Method: Simple face mask Placement Confirmation: positive ETCO2 Dental Injury: Teeth and Oropharynx as per pre-operative assessment      

## 2013-12-29 NOTE — Anesthesia Preprocedure Evaluation (Addendum)
Anesthesia Evaluation  Patient identified by MRN, date of birth, ID band Patient awake    Reviewed: Allergy & Precautions, NPO status , Patient's Chart, lab work & pertinent test results  History of Anesthesia Complications Negative for: history of anesthetic complications  Airway Mallampati: II  TM Distance: >3 FB Neck ROM: Full    Dental  (+) Poor Dentition, Dental Advisory Given   Pulmonary former smoker,    Pulmonary exam normal       Cardiovascular hypertension, + CAD     Neuro/Psych negative neurological ROS  negative psych ROS   GI/Hepatic GERD-  ,  Endo/Other  diabetes  Renal/GU      Musculoskeletal   Abdominal   Peds  Hematology   Anesthesia Other Findings   Reproductive/Obstetrics                            Anesthesia Physical Anesthesia Plan  ASA: III  Anesthesia Plan: MAC   Post-op Pain Management:    Induction: Intravenous  Airway Management Planned: Simple Face Mask  Additional Equipment:   Intra-op Plan:   Post-operative Plan:   Informed Consent: I have reviewed the patients History and Physical, chart, labs and discussed the procedure including the risks, benefits and alternatives for the proposed anesthesia with the patient or authorized representative who has indicated his/her understanding and acceptance.   Dental advisory given  Plan Discussed with: CRNA, Anesthesiologist and Surgeon  Anesthesia Plan Comments:         Anesthesia Quick Evaluation

## 2014-01-01 ENCOUNTER — Encounter (HOSPITAL_BASED_OUTPATIENT_CLINIC_OR_DEPARTMENT_OTHER): Payer: Self-pay | Admitting: Orthopedic Surgery

## 2014-01-30 LAB — HEMOGLOBIN A1C: Hgb A1c MFr Bld: 6.4 % — AB (ref 4.0–6.0)

## 2014-04-25 DIAGNOSIS — F320 Major depressive disorder, single episode, mild: Secondary | ICD-10-CM | POA: Insufficient documentation

## 2014-04-25 DIAGNOSIS — E785 Hyperlipidemia, unspecified: Secondary | ICD-10-CM | POA: Insufficient documentation

## 2014-04-25 DIAGNOSIS — S0500XA Injury of conjunctiva and corneal abrasion without foreign body, unspecified eye, initial encounter: Secondary | ICD-10-CM | POA: Insufficient documentation

## 2014-04-25 DIAGNOSIS — Z8619 Personal history of other infectious and parasitic diseases: Secondary | ICD-10-CM | POA: Insufficient documentation

## 2014-04-25 DIAGNOSIS — I10 Essential (primary) hypertension: Secondary | ICD-10-CM | POA: Insufficient documentation

## 2014-04-25 DIAGNOSIS — I2510 Atherosclerotic heart disease of native coronary artery without angina pectoris: Secondary | ICD-10-CM | POA: Insufficient documentation

## 2014-04-25 DIAGNOSIS — M1990 Unspecified osteoarthritis, unspecified site: Secondary | ICD-10-CM | POA: Insufficient documentation

## 2014-04-25 DIAGNOSIS — L639 Alopecia areata, unspecified: Secondary | ICD-10-CM | POA: Insufficient documentation

## 2014-04-25 DIAGNOSIS — I208 Other forms of angina pectoris: Secondary | ICD-10-CM | POA: Insufficient documentation

## 2014-06-26 ENCOUNTER — Encounter: Payer: Self-pay | Admitting: Family Medicine

## 2014-06-26 ENCOUNTER — Ambulatory Visit (INDEPENDENT_AMBULATORY_CARE_PROVIDER_SITE_OTHER): Payer: 59 | Admitting: Family Medicine

## 2014-06-26 VITALS — BP 108/64 | HR 74 | Temp 97.5°F | Resp 16 | Ht 71.0 in | Wt 212.0 lb

## 2014-06-26 DIAGNOSIS — I2510 Atherosclerotic heart disease of native coronary artery without angina pectoris: Secondary | ICD-10-CM

## 2014-06-26 DIAGNOSIS — E785 Hyperlipidemia, unspecified: Secondary | ICD-10-CM

## 2014-06-26 DIAGNOSIS — Z0000 Encounter for general adult medical examination without abnormal findings: Secondary | ICD-10-CM

## 2014-06-26 DIAGNOSIS — I10 Essential (primary) hypertension: Secondary | ICD-10-CM | POA: Diagnosis not present

## 2014-06-26 DIAGNOSIS — E119 Type 2 diabetes mellitus without complications: Secondary | ICD-10-CM

## 2014-06-26 LAB — POCT URINALYSIS DIPSTICK
Bilirubin, UA: NEGATIVE
Blood, UA: NEGATIVE
Glucose, UA: NEGATIVE
Ketones, UA: NEGATIVE
LEUKOCYTES UA: NEGATIVE
NITRITE UA: NEGATIVE
PH UA: 6
PROTEIN UA: NEGATIVE
Spec Grav, UA: 1.02
UROBILINOGEN UA: 0.2

## 2014-06-26 LAB — IFOBT (OCCULT BLOOD): IFOBT: NEGATIVE

## 2014-06-26 NOTE — Progress Notes (Signed)
Patient: Samuel Gentry, Male    DOB: 12/02/1955, 59 y.o.   MRN: [id number] Visit Date: 06/26/2014  Today's Provider: Megan Mans, MD   No chief complaint on file.  Subjective:   JAHSIR Gentry is a 59 y.o. male who presents today for his Subsequent Annual Wellness Visit. He feels well. He reports exercising is mostly related to work, he has a very physical job. He reports he is sleeping well.  Review of Systems  Constitutional: Negative.   HENT: Negative.   Eyes: Negative.   Respiratory: Negative.   Cardiovascular: Negative.  Negative for chest pain.  Gastrointestinal: Negative.   Endocrine: Negative.   Genitourinary: Negative.   Musculoskeletal: Negative.         Right Knee and bilateral ankle pain and living in the [employer] workday  Skin: Negative.        Losing hair on head and body.  Allergic/Immunologic: Negative.   Neurological: Negative.   Hematological: Negative.   Psychiatric/Behavioral: Negative.     Patient Active Problem List   Diagnosis Date Noted  . AA (alopecia areata) 04/25/2014  . Angina of effort 04/25/2014  . Arthritis 04/25/2014  . CAD in native artery 04/25/2014  . History of chicken pox 04/25/2014  . Abrasion of cornea 04/25/2014  . Depression, major, single episode, mild 04/25/2014  . HLD (hyperlipidemia) 04/25/2014  . BP (high blood pressure) 04/25/2014  . Chest pain 02/09/2012  . CAD (coronary artery disease) 02/09/2012  . S/P coronary artery stent placement 02/09/2012  . Diabetes mellitus 02/09/2012  . Hyperlipidemia 02/09/2012    History   Social History  . Marital Status: Married    Spouse Name: N/A  . Number of Children: N/A  . Years of Education: N/A   Occupational History  . Not on file.   Social History Main Topics  . Smoking status: Former Smoker -- 2.00 packs/day for 8 years    Types: Cigarettes    Quit date: 02/08/1982  . Smokeless tobacco: Not on file  . Alcohol Use: No  . Drug Use: No  . Sexual Activity: Not  on file   Other Topics Concern  . Not on file   Social History Narrative    Past Surgical History  Procedure Laterality Date  . Knee surgery  1978    rt  . Neck surgery  2010  . Ankle fusion  2003    left ankle  . Cardiac catheterization  2003  . Coronary angioplasty  2006  . Back surgery  93,97    lumb x2  . Dorsal compartment release Left 12/29/2013    Procedure: LEFT WRIST RELEASE DORSAL COMPARTMENT (DEQUERVAIN);  Surgeon: Samuel Marble, MD;  Location: [facility];  Service: Orthopedics;  Laterality: Left;    His family history includes Diabetes in his father; Hyperlipidemia in his mother.    Outpatient Prescriptions Prior to Visit  Medication Sig Dispense Refill  . Alogliptin-Metformin HCl (KAZANO) 12.05-998 MG TABS Take by mouth 2 (two) times daily at 10 AM and 5 PM.    . Alogliptin-Metformin HCl 12.05-998 MG TABS Take 1 tablet by mouth 2 (two) times daily.    Marland Kitchen ALPRAZolam (XANAX) 0.5 MG tablet Take 1 tablet by mouth every 8 (eight) hours as needed.    Marland Kitchen amLODipine (NORVASC) 10 MG tablet Take 10 mg by mouth daily.    Marland Kitchen amLODipine (NORVASC) 5 MG tablet Take 1 tablet by mouth daily.    Marland Kitchen aspirin 325 MG tablet Take 325  mg by mouth daily.    Marland Kitchen aspirin 325 MG tablet Take 1 tablet by mouth daily.    Marland Kitchen atenolol (TENORMIN) 50 MG tablet Take 50 mg by mouth daily.    Marland Kitchen atenolol (TENORMIN) 50 MG tablet Take 1 tablet by mouth daily.    Marland Kitchen lisinopril (PRINIVIL,ZESTRIL) 10 MG tablet Take 10 mg by mouth daily.    Marland Kitchen lisinopril (PRINIVIL,ZESTRIL) 20 MG tablet Take 1 tablet by mouth daily.    . Multiple Vitamin (MULTIVITAMIN) tablet Take 1 tablet by mouth daily.    . MULTIPLE VITAMINS PO Take 1 tablet by mouth daily.    . Multiple Vitamins-Minerals (MULTIVITAMIN WITH MINERALS) tablet Take 1 tablet by mouth daily.    . naproxen (NAPROSYN) 500 MG tablet Take 1 tablet (500 mg total) by mouth 2 (two) times daily with a meal. 30 tablet 1  . nitroGLYCERIN (NITROSTAT) 0.4 MG SL  tablet Place 0.4 mg under the tongue every 5 (five) minutes as needed.    Marland Kitchen omeprazole (PRILOSEC) 20 MG capsule Take 20 mg by mouth daily.    Marland Kitchen oxyCODONE-acetaminophen (ROXICET) 5-325 MG per tablet Take 1-2 tablets by mouth every 4 (four) hours as needed for severe pain. 40 tablet 0  . rosuvastatin (CRESTOR) 40 MG tablet Take 20 mg by mouth daily.     . rosuvastatin (CRESTOR) 40 MG tablet Take 1 tablet by mouth daily.    . sertraline (ZOLOFT) 50 MG tablet Take 50 mg by mouth at bedtime.    . sertraline (ZOLOFT) 50 MG tablet Take 1 tablet by mouth daily.    . traMADol (ULTRAM) 50 MG tablet Take 1-2 tablets by mouth every 6 (six) hours as needed.     No facility-administered medications prior to visit.    No Known Allergies  Patient Care Team: Maple Hudson., MD as PCP - General (Unknown Physician Specialty)  Objective:   Vitals: There were no vitals filed for this visit.  Physical Exam  Constitutional: He is oriented to person, place, and time. He appears well-developed and well-nourished.  HENT:  Head: Normocephalic and atraumatic.  Right Ear: External ear normal.  Nose: Nose normal.  Mouth/Throat: Oropharynx is clear and moist.  Eyes: Conjunctivae and EOM are normal. Pupils are equal, round, and reactive to light.  Neck: Normal range of motion. Neck supple.  Cardiovascular: Normal rate, regular rhythm, normal heart sounds and intact distal pulses.   Pulmonary/Chest: Effort normal and breath sounds normal.  Abdominal: Soft. Bowel sounds are normal.  Genitourinary: Rectum normal, prostate normal and penis normal.  Musculoskeletal: Normal range of motion.  Neurological: He is alert and oriented to person, place, and time.  Skin: Skin is warm and dry.  Patches of alopecia on head and body.  Psychiatric: He has a normal mood and affect. His behavior is normal. Thought content normal.     Assessment & Plan:     Annual Wellness Visit  Reviewed patient's Family Medical  History Reviewed and updated list of patient's medical providers Assessment of cognitive impairment was done Assessed patient's functional ability Established a written schedule for health screening services Health Risk Assessent Completed and Reviewed  Exercise Activities and Dietary recommendations Goals    None      Immunization History  Administered Date(s) Administered  . Tdap 01/02/2010    Health Maintenance  Topic Date Due  . PNEUMOCOCCAL POLYSACCHARIDE VACCINE (1) 08/25/1957  . FOOT EXAM  08/25/1965  . OPHTHALMOLOGY EXAM  08/25/1965  . URINE MICROALBUMIN  08/25/1965  . HIV Screening  08/26/1970  . COLONOSCOPY  08/25/2005  . HEMOGLOBIN A1C  07/31/2014  . INFLUENZA VACCINE  08/13/2014  . TETANUS/TDAP  01/03/2020      Discussed health benefits of physical activity, and encouraged him to engage in regular exercise appropriate for his age and condition.    Julieanne Manson MD [facility] 06/26/2014 8:37 AM  ------------------------------------------------------------------------------------------------------------

## 2014-06-27 LAB — CBC WITH DIFFERENTIAL/PLATELET
BASOS: 0 %
Basophils Absolute: 0 10*3/uL (ref 0.0–0.2)
EOS (ABSOLUTE): 0.3 10*3/uL (ref 0.0–0.4)
Eos: 5 %
HEMOGLOBIN: 14.9 g/dL (ref 12.6–17.7)
Hematocrit: 42.8 % (ref 37.5–51.0)
Immature Grans (Abs): 0 10*3/uL (ref 0.0–0.1)
Immature Granulocytes: 0 %
Lymphocytes Absolute: 1.6 10*3/uL (ref 0.7–3.1)
Lymphs: 26 %
MCH: 29.8 pg (ref 26.6–33.0)
MCHC: 34.8 g/dL (ref 31.5–35.7)
MCV: 86 fL (ref 79–97)
MONOS ABS: 0.6 10*3/uL (ref 0.1–0.9)
Monocytes: 10 %
Neutrophils Absolute: 3.6 10*3/uL (ref 1.4–7.0)
Neutrophils: 59 %
PLATELETS: 236 10*3/uL (ref 150–379)
RBC: 5 x10E6/uL (ref 4.14–5.80)
RDW: 13.3 % (ref 12.3–15.4)
WBC: 6.1 10*3/uL (ref 3.4–10.8)

## 2014-06-27 LAB — LIPID PANEL WITH LDL/HDL RATIO
Cholesterol, Total: 168 mg/dL (ref 100–199)
HDL: 45 mg/dL (ref >39)
LDL CALC: 102 mg/dL — AB (ref 0–99)
LDL/HDL RATIO: 2.3 ratio (ref 0.0–3.6)
Triglycerides: 107 mg/dL (ref 0–149)
VLDL CHOLESTEROL CAL: 21 mg/dL (ref 5–40)

## 2014-06-27 LAB — CMP14+EGFR
ALBUMIN: 4.9 g/dL (ref 3.5–5.5)
ALT: 49 IU/L — AB (ref 0–44)
AST: 42 IU/L — ABNORMAL HIGH (ref 0–40)
Albumin/Globulin Ratio: 1.8 (ref 1.1–2.5)
Alkaline Phosphatase: 60 IU/L (ref 39–117)
BILIRUBIN TOTAL: 0.5 mg/dL (ref 0.0–1.2)
BUN/Creatinine Ratio: 23 — ABNORMAL HIGH (ref 9–20)
BUN: 21 mg/dL (ref 6–24)
CHLORIDE: 99 mmol/L (ref 97–108)
CO2: 22 mmol/L (ref 18–29)
Calcium: 9.9 mg/dL (ref 8.7–10.2)
Creatinine, Ser: 0.93 mg/dL (ref 0.76–1.27)
GFR calc non Af Amer: 90 mL/min/{1.73_m2} (ref >59)
GFR, EST AFRICAN AMERICAN: 104 mL/min/{1.73_m2} (ref >59)
Globulin, Total: 2.8 g/dL (ref 1.5–4.5)
Glucose: 157 mg/dL — ABNORMAL HIGH (ref 65–99)
POTASSIUM: 5.4 mmol/L — AB (ref 3.5–5.2)
Sodium: 139 mmol/L (ref 134–144)
TOTAL PROTEIN: 7.7 g/dL (ref 6.0–8.5)

## 2014-06-27 LAB — PSA: PROSTATE SPECIFIC AG, SERUM: 2 ng/mL (ref 0.0–4.0)

## 2014-06-27 LAB — HEMOGLOBIN A1C
ESTIMATED AVERAGE GLUCOSE: 163 mg/dL
HEMOGLOBIN A1C: 7.3 % — AB (ref 4.8–5.6)

## 2014-06-27 LAB — TSH: TSH: 2.3 u[IU]/mL (ref 0.450–4.500)

## 2014-06-28 ENCOUNTER — Encounter: Payer: Self-pay | Admitting: Family Medicine

## 2014-07-02 ENCOUNTER — Telehealth: Payer: Self-pay

## 2014-07-02 NOTE — Telephone Encounter (Signed)
-----   Message from Maple Hudson., MD sent at 07/01/2014  4:05 PM EDT ----- Labs stable. Continue meds and to work on diet and exercise. Mild fatty liver. Limit Tylenol intake. We'll recheck some labs including potassium and liver function on next visit.

## 2014-07-04 ENCOUNTER — Telehealth: Payer: Self-pay | Admitting: Family Medicine

## 2014-07-04 NOTE — Telephone Encounter (Signed)
Mrs. Samuel Gentry

## 2014-07-04 NOTE — Telephone Encounter (Signed)
Pt's wife Corrie Dandy stated she was returning Ana's call and thinks it is about labs results. Thanks TNP

## 2014-07-23 ENCOUNTER — Telehealth: Payer: Self-pay

## 2014-07-23 NOTE — Telephone Encounter (Signed)
Pt's wife for advise if necessary to make appointment for diabetic husband having a foot pain, more pain with activity verses a podiatric appointment. Pt's wife was advised to make appointment for Dr. Sullivan LoneGilbert to see pt first. Call was transferred to front desk to make the appointment.

## 2014-07-25 ENCOUNTER — Ambulatory Visit (INDEPENDENT_AMBULATORY_CARE_PROVIDER_SITE_OTHER): Payer: 59 | Admitting: Family Medicine

## 2014-07-25 ENCOUNTER — Encounter: Payer: Self-pay | Admitting: Family Medicine

## 2014-07-25 ENCOUNTER — Other Ambulatory Visit: Payer: Self-pay | Admitting: Family Medicine

## 2014-07-25 ENCOUNTER — Ambulatory Visit
Admission: RE | Admit: 2014-07-25 | Discharge: 2014-07-25 | Disposition: A | Discharge status: Home / Self Care | Payer: 59 | Source: Ambulatory Visit | Attending: Family Medicine | Admitting: Family Medicine

## 2014-07-25 VITALS — BP 130/78 | HR 60 | Temp 97.7°F | Resp 16 | Wt 215.0 lb

## 2014-07-25 DIAGNOSIS — M79671 Pain in right foot: Secondary | ICD-10-CM

## 2014-07-25 DIAGNOSIS — M722 Plantar fascial fibromatosis: Secondary | ICD-10-CM | POA: Diagnosis not present

## 2014-07-25 MED ORDER — NAPROXEN 500 MG PO TABS: 500.0000 mg | ORAL_TABLET | Freq: Two times a day (BID) | ORAL | Status: DC

## 2014-07-25 MED ORDER — NAPROXEN 500 MG PO TABS
500.0000 mg | ORAL_TABLET | Freq: Two times a day (BID) | ORAL | Status: DC
Start: 2014-07-25 — End: 2014-07-25

## 2014-07-25 NOTE — Patient Instructions (Signed)
Use naproxen twice a day for the time being. Appointment will be made for podiatry. If it does not improve with the foot keep this appointment. Use ice for the next couple of days in the evening with the foot and then after that heat. Inserts for your shoes may be of benefit. Get an x-ray done today as you're leaving the office.

## 2014-07-25 NOTE — Progress Notes (Signed)
Patient ID: Samuel Gentry, male   DOB: August 05, 1955, 59 y.o.   MRN: [id number]    Subjective:  Foot Pain This is a new problem. The current episode started in the past 7 days. The problem occurs constantly. The problem has been unchanged. Pertinent negatives include no abdominal pain, anorexia, arthralgias, change in bowel habit, chest pain, chills, congestion, coughing, diaphoresis, fatigue, fever, headaches, joint swelling, myalgias, nausea, neck pain, numbness, rash, sore throat, swollen glands, urinary symptoms, vertigo, visual change, vomiting or weakness. The symptoms are aggravated by walking. He has tried nothing for the symptoms.   Pt reports that he has not done anything to his foot, it just started hurting on day. He reports that he hurt at the ball of his foot and goes around to the top of his foot and when he walks even up his shin. He reports that he is he is in quite a bit of pain. He reports that his wife and boss man him come to the doctor. He reports his pain level as a 7-8/10. " Its to the point that I dread getting up out of this chair, that's how bad it is. If that tells you anything". He denies any swelling or redness. He describes it as a achy pain.   Prior to Admission medications   Medication Sig Start Date End Date Taking? Authorizing Provider  Alogliptin-Metformin HCl 12.05-998 MG TABS Take 1 tablet by mouth 2 (two) times daily. 03/12/14  Yes Historical Provider, MD  ALPRAZolam Prudy Feeler) 0.5 MG tablet Take 1 tablet by mouth every 8 (eight) hours as needed. 10/04/13  Yes Historical Provider, MD  amLODipine (NORVASC) 5 MG tablet Take 1 tablet by mouth daily. 09/26/13  Yes Historical Provider, MD  aspirin 325 MG tablet Take 1 tablet by mouth daily. 01/02/10  Yes Historical Provider, MD  atenolol (TENORMIN) 50 MG tablet Take 1 tablet by mouth daily. 10/18/13  Yes Historical Provider, MD  lisinopril (PRINIVIL,ZESTRIL) 20 MG tablet Take 1 tablet by mouth daily. 09/04/13  Yes Historical  Provider, MD  MULTIPLE VITAMINS PO Take 1 tablet by mouth daily. 01/02/10  Yes Historical Provider, MD  nitroGLYCERIN (NITROSTAT) 0.4 MG SL tablet Place 0.4 mg under the tongue every 5 (five) minutes as needed.   Yes Historical Provider, MD  omeprazole (PRILOSEC) 20 MG capsule Take 20 mg by mouth daily.   Yes Historical Provider, MD  rosuvastatin (CRESTOR) 40 MG tablet Take 1 tablet by mouth daily. 10/04/13  Yes Historical Provider, MD  sertraline (ZOLOFT) 50 MG tablet Take 1 tablet by mouth daily. 09/26/13  Yes Historical Provider, MD    Patient Active Problem List   Diagnosis Date Noted  . AA (alopecia areata) 04/25/2014  . Angina of effort 04/25/2014  . Arthritis 04/25/2014  . CAD in native artery 04/25/2014  . History of chicken pox 04/25/2014  . Abrasion of cornea 04/25/2014  . Depression, major, single episode, mild 04/25/2014  . HLD (hyperlipidemia) 04/25/2014  . BP (high blood pressure) 04/25/2014  . Chest pain 02/09/2012  . CAD (coronary artery disease) 02/09/2012  . S/P coronary artery stent placement 02/09/2012  . Diabetes mellitus 02/09/2012  . Hyperlipidemia 02/09/2012    Past Medical History  Diagnosis Date  . Type II or unspecified type diabetes mellitus without mention of complication, not stated as uncontrolled   . Other and unspecified hyperlipidemia   . Unspecified essential hypertension   . Status post cardiac catheterization 2003    [facility]  . Coronary atherosclerosis of native  coronary artery 11/19/2004    2 [place] to [place]   . Wears glasses   . GERD (gastroesophageal reflux disease)   . Arthritis   . DDD (degenerative disc disease), lumbar   . Anxiety     History   Social History  . Marital Status: Married    Spouse Name: N/A  . Number of Children: N/A  . Years of Education: N/A   Occupational History  . Not on file.   Social History Main Topics  . Smoking status: Former Smoker -- 2.00 packs/day for 8 years    Types:  Cigarettes    Quit date: 02/08/1982  . Smokeless tobacco: Not on file  . Alcohol Use: No  . Drug Use: No  . Sexual Activity: Not on file   Other Topics Concern  . Not on file   Social History Narrative    No Known Allergies  Review of Systems  Constitutional: Negative.  Negative for fever, chills, diaphoresis and fatigue.  HENT: Negative.  Negative for congestion and sore throat.   Eyes: Negative.   Respiratory: Negative.  Negative for cough.   Cardiovascular: Negative.  Negative for chest pain.  Gastrointestinal: Negative for nausea, vomiting, abdominal pain, anorexia and change in bowel habit.  Genitourinary: Negative.   Musculoskeletal: Negative.  Negative for myalgias, joint swelling, arthralgias and neck pain.       Foot pain  Skin: Negative.  Negative for rash.  Neurological: Negative.  Negative for vertigo, weakness, numbness and headaches.  Endo/Heme/Allergies: Negative.   Psychiatric/Behavioral: Negative.     Immunization History  Administered Date(s) Administered  . Tdap 01/02/2010   Objective:  BP 130/78 mmHg  Pulse 60  Temp(Src) 97.7 F (36.5 C)  Resp 16  Wt 215 lb (97.523 kg)  Physical Exam  Constitutional: He is oriented to person, place, and time and well-developed, well-nourished, and in no distress.  HENT:  Head: Normocephalic.  Cardiovascular: Normal rate, regular rhythm and normal heart sounds.   Musculoskeletal:  Warmth but very patient is exquisitely tender over the mid/lateral arch of the right foot. He is also still very tender but less so of the third and fourth metatarsals in this area. All the tenderness for the most part is on the plantar surface of the foot. There is some tenderness when squeezing the foot laterally. Neurovascular exam of the foot is normal  Neurological: He is alert and oriented to person, place, and time.  Skin: Skin is warm and dry.  Psychiatric: Mood, memory, affect and judgment normal.    Lab Results  Component  Value Date   WBC 6.1 06/26/2014   HGB 14.9 12/29/2013   HCT 42.8 06/26/2014   PLT 219 05/17/2013   GLUCOSE 157* 06/26/2014   CHOL 168 06/26/2014   TRIG 107 06/26/2014   HDL 45 06/26/2014   LDLCALC 102* 06/26/2014   TSH 2.300 06/26/2014   PSA 2.0 06/26/2014   HGBA1C 7.3* 06/26/2014    CMP     Component Value Date/Time   NA 139 06/26/2014 0942   NA 135* 12/27/2013 1530   K 5.4* 06/26/2014 0942   CL 99 06/26/2014 0942   CO2 22 06/26/2014 0942   GLUCOSE 157* 06/26/2014 0942   GLUCOSE 124* 12/27/2013 1530   BUN 21 06/26/2014 0942   BUN 38* 12/27/2013 1530   CREATININE 0.93 06/26/2014 0942   CREATININE 1.2 05/17/2013   CALCIUM 9.9 06/26/2014 0942   PROT 7.7 06/26/2014 0942   AST 42*  06/26/2014 0942   ALT 49* 06/26/2014 0942   ALKPHOS 60 06/26/2014 0942   BILITOT 0.5 06/26/2014 0942   GFRNONAA 90 06/26/2014 0942   GFRAA 104 06/26/2014 0942    Assessment and Plan :    Julieanne Manson MD [facility] 07/25/2014 8:11 AM

## 2014-07-25 NOTE — Addendum Note (Signed)
Addended by: Julieanne MansonGILBERT, RICHARD on: 07/25/2014 09:07 AM   Modules accepted: Orders

## 2014-07-26 ENCOUNTER — Telehealth: Payer: Self-pay | Admitting: Family Medicine

## 2014-07-26 LAB — CBC WITH DIFFERENTIAL/PLATELET
BASOS: 0 %
Basophils Absolute: 0 10*3/uL (ref 0.0–0.2)
EOS (ABSOLUTE): 0.2 10*3/uL (ref 0.0–0.4)
Eos: 4 %
HEMOGLOBIN: 13.7 g/dL (ref 12.6–17.7)
Hematocrit: 39.9 % (ref 37.5–51.0)
Immature Grans (Abs): 0 10*3/uL (ref 0.0–0.1)
Immature Granulocytes: 0 %
LYMPHS ABS: 1.6 10*3/uL (ref 0.7–3.1)
LYMPHS: 28 %
MCH: 29.3 pg (ref 26.6–33.0)
MCHC: 34.3 g/dL (ref 31.5–35.7)
MCV: 85 fL (ref 79–97)
MONOCYTES: 11 %
MONOS ABS: 0.7 10*3/uL (ref 0.1–0.9)
Neutrophils Absolute: 3.3 10*3/uL (ref 1.4–7.0)
Neutrophils: 57 %
Platelets: 222 10*3/uL (ref 150–379)
RBC: 4.67 x10E6/uL (ref 4.14–5.80)
RDW: 13.8 % (ref 12.3–15.4)
WBC: 5.9 10*3/uL (ref 3.4–10.8)

## 2014-07-26 LAB — SEDIMENTATION RATE: SED RATE: 2 mm/h (ref 0–30)

## 2014-07-26 LAB — URIC ACID: Uric Acid: 5.2 mg/dL (ref 3.7–8.6)

## 2014-07-26 NOTE — Telephone Encounter (Signed)
LMTCB ED 

## 2014-07-26 NOTE — Telephone Encounter (Signed)
Advised  ED 

## 2014-07-26 NOTE — Telephone Encounter (Signed)
-----   Message from Richard L Gilbert Jr., MD sent at 07/25/2014  2:19 PM EDT ----- X-ray of foot okay. 

## 2014-07-26 NOTE — Telephone Encounter (Signed)
Pt called back and siad you could speak with his wife about his labs.  Her name is MAry and she is on his hippa form.  Her call back is [phone].  Thanks, Barth Kirkseri

## 2014-07-26 NOTE — Telephone Encounter (Signed)
-----   Message from Maple Hudsonichard L Gilbert Jr., MD sent at 07/25/2014  2:19 PM EDT ----- X-ray of foot okay.

## 2014-07-31 NOTE — Telephone Encounter (Signed)
Pt advised-aa 

## 2014-07-31 NOTE — Telephone Encounter (Signed)
-----   Message from Maple Hudsonichard L Gilbert Jr., MD sent at 07/26/2014 10:29 AM EDT ----- Labs normal

## 2014-09-12 ENCOUNTER — Other Ambulatory Visit: Payer: Self-pay | Admitting: Family Medicine

## 2014-09-20 ENCOUNTER — Other Ambulatory Visit: Payer: Self-pay | Admitting: Family Medicine

## 2014-09-20 MED ORDER — ROSUVASTATIN CALCIUM 40 MG PO TABS: 40.0000 mg | ORAL_TABLET | Freq: Every day | ORAL | Status: DC

## 2014-09-20 NOTE — Telephone Encounter (Signed)
Done-aa 

## 2014-09-20 NOTE — Telephone Encounter (Signed)
Pt's wife is requesting refill on rosuvastatin (CRESTOR) 40 MG tablet sent to  [facility] in [place]. Wife stated that insurance will not cover the Crestor brand and would like the RX to be for Rosuvastatin. Thanks TNP

## 2014-10-05 ENCOUNTER — Other Ambulatory Visit: Payer: Self-pay | Admitting: Family Medicine

## 2014-10-05 DIAGNOSIS — I10 Essential (primary) hypertension: Secondary | ICD-10-CM

## 2014-10-11 ENCOUNTER — Other Ambulatory Visit: Payer: Self-pay | Admitting: Family Medicine

## 2014-11-15 ENCOUNTER — Other Ambulatory Visit: Payer: Self-pay | Admitting: Family Medicine

## 2014-12-13 ENCOUNTER — Other Ambulatory Visit: Payer: Self-pay | Admitting: Family Medicine

## 2014-12-26 ENCOUNTER — Encounter: Payer: Self-pay | Admitting: Family Medicine

## 2015-01-16 ENCOUNTER — Encounter: Payer: Self-pay | Admitting: Family Medicine

## 2015-01-16 ENCOUNTER — Ambulatory Visit (INDEPENDENT_AMBULATORY_CARE_PROVIDER_SITE_OTHER): Payer: 59 | Admitting: Family Medicine

## 2015-01-16 VITALS — BP 112/72 | HR 72 | Temp 98.0°F | Resp 16 | Wt 218.0 lb

## 2015-01-16 DIAGNOSIS — K219 Gastro-esophageal reflux disease without esophagitis: Secondary | ICD-10-CM | POA: Diagnosis not present

## 2015-01-16 DIAGNOSIS — I10 Essential (primary) hypertension: Secondary | ICD-10-CM | POA: Diagnosis not present

## 2015-01-16 DIAGNOSIS — E119 Type 2 diabetes mellitus without complications: Secondary | ICD-10-CM | POA: Diagnosis not present

## 2015-01-16 DIAGNOSIS — F320 Major depressive disorder, single episode, mild: Secondary | ICD-10-CM

## 2015-01-16 DIAGNOSIS — R079 Chest pain, unspecified: Secondary | ICD-10-CM

## 2015-01-16 DIAGNOSIS — R748 Abnormal levels of other serum enzymes: Secondary | ICD-10-CM

## 2015-01-16 LAB — POCT GLYCOSYLATED HEMOGLOBIN (HGB A1C): Hemoglobin A1C: 7.4

## 2015-01-16 MED ORDER — OMEPRAZOLE 20 MG PO CPDR: 20.0000 mg | DELAYED_RELEASE_CAPSULE | Freq: Two times a day (BID) | ORAL | Status: AC

## 2015-01-16 NOTE — Progress Notes (Signed)
Patient ID: Samuel Gentry, male   DOB: 1955/10/09, 60 y.o.   MRN: [id number]    Subjective:  HPI  Diabetes Mellitus Type II, Follow-up:   Lab Results  Component Value Date   HGBA1C 7.3* 06/26/2014   HGBA1C 6.4* 01/30/2014   Last seen for diabetes 6 months ago.  Management since then includes none. He reports good compliance with treatment. He is not having side effects.  Current symptoms include none. Home blood sugar records: not being checked recently  Episodes of hypoglycemia? no   Current Insulin Regimen: n/a Most Recent Eye Exam: 12/2014 Current exercise: no regular exercise and however very active job for 8 hours daily  ------------------------------------------------------------------------   Hypertension, follow-up:  BP Readings from Last 3 Encounters:  01/16/15 112/72  07/25/14 130/78  06/26/14 108/64    He was last seen for hypertension 6 months ago.  BP at that visit was 130/78. Management since that visit includes none .He reports good compliance with treatment. He is not having side effects.  He is exercising. Outside blood pressures are being checked occasionally, normally around 120's/70's. He is experiencing chest pain, dyspnea and fatigue.  Patient denies irregular heart beat and palpitations.   Cardiovascular risk factors include advanced age (older than 9 for men, 71 for women), diabetes mellitus, dyslipidemia, hypertension, male gender, obesity (BMI >= 30 kg/m2) and smoking/ tobacco exposure.   ------------------------------------------------------------------------    Lipid/Cholesterol, Follow-up:   Last seen for this 6 months ago.  Management since that visit includes none.  Last Lipid Panel:    Component Value Date/Time   CHOL 168 06/26/2014 0942   CHOL 161 05/17/2013   TRIG 107 06/26/2014 0942   HDL 45 06/26/2014 0942   HDL 45 05/17/2013   LDLCALC 102* 06/26/2014 0942   LDLCALC 94 05/17/2013    He reports good compliance  with treatment. He is not having side effects.   Wt Readings from Last 3 Encounters:  01/16/15 218 lb (98.884 kg)  07/25/14 215 lb (97.523 kg)  06/26/14 212 lb (96.163 kg)   ------------------------------------------------------------------------ Pt reports that he has had some recent stress with work and " life" but feels ok emotionally. He has been having some chest pain and shortness of breath that comes and goes. He thinks it may be muscular or anxiety related, but this has been going on since about the fall.  Prior to Admission medications   Medication Sig Start Date End Date Taking? Authorizing Provider  Alogliptin-Metformin HCl 12.05-998 MG TABS Take 1 tablet by mouth 2 (two) times daily. 03/12/14  Yes Historical Provider, MD  ALPRAZolam Prudy Feeler) 0.5 MG tablet Take 1 tablet by mouth every 8 (eight) hours as needed. 10/04/13  Yes Historical Provider, MD  amLODipine (NORVASC) 5 MG tablet TAKE 1 TABLET BY MOUTH DAILY 10/08/14  Yes Maple Hudson., MD  aspirin 325 MG tablet Take 1 tablet by mouth daily. 01/02/10  Yes Historical Provider, MD  atenolol (TENORMIN) 50 MG tablet TAKE 1 TABLET BY MOUTH EVERY DAY 11/16/14  Yes Richard Hulen Shouts., MD  lisinopril (PRINIVIL,ZESTRIL) 20 MG tablet TAKE 1 TABLET BY MOUTH DAILY. 12/13/14  Yes Richard Hulen Shouts., MD  MULTIPLE VITAMINS PO Take 1 tablet by mouth daily. 01/02/10  Yes Historical Provider, MD  nitroGLYCERIN (NITROSTAT) 0.4 MG SL tablet Place 0.4 mg under the tongue every 5 (five) minutes as needed.   Yes Historical Provider, MD  omeprazole (PRILOSEC) 20 MG capsule Take 20 mg by mouth daily.   Yes  Historical Provider, MD  rosuvastatin (CRESTOR) 40 MG tablet Take 1 tablet (40 mg total) by mouth daily. 09/20/14  Yes Richard Hulen ShoutsL Gilbert Jr., MD  sertraline (ZOLOFT) 50 MG tablet TAKE 1 TABLET BY MOUTH EVERY NIGHT AT BEDTIME. 12/13/14  Yes Maple Hudsonichard L Gilbert Jr., MD    Patient Active Problem List   Diagnosis Date Noted  . Foot pain, right  07/25/2014  . AA (alopecia areata) 04/25/2014  . Angina of effort (HCC) 04/25/2014  . Arthritis 04/25/2014  . CAD in native artery 04/25/2014  . History of chicken pox 04/25/2014  . Abrasion of cornea 04/25/2014  . Depression, major, single episode, mild (HCC) 04/25/2014  . HLD (hyperlipidemia) 04/25/2014  . BP (high blood pressure) 04/25/2014  . Chest pain 02/09/2012  . CAD (coronary artery disease) 02/09/2012  . S/P coronary artery stent placement 02/09/2012  . Diabetes mellitus (HCC) 02/09/2012  . Hyperlipidemia 02/09/2012    Past Medical History  Diagnosis Date  . Type II or unspecified type diabetes mellitus without mention of complication, not stated as uncontrolled   . Other and unspecified hyperlipidemia   . Unspecified essential hypertension   . Status post cardiac catheterization 2003    [facility]  . Coronary atherosclerosis of native coronary artery 11/19/2004    2 [place] to [place]   . Wears glasses   . GERD (gastroesophageal reflux disease)   . Arthritis   . DDD (degenerative disc disease), lumbar   . Anxiety     Social History   Social History  . Marital Status: Married    Spouse Name: N/A  . Number of Children: N/A  . Years of Education: N/A   Occupational History  . Not on file.   Social History Main Topics  . Smoking status: Former Smoker -- 2.00 packs/day for 8 years    Types: Cigarettes    Quit date: 02/08/1982  . Smokeless tobacco: Not on file  . Alcohol Use: No  . Drug Use: No  . Sexual Activity: Not on file   Other Topics Concern  . Not on file   Social History Narrative    Allergies  Allergen Reactions  . Prednisone Other (See Comments)    Makes him very mean    Review of Systems  Constitutional: Positive for malaise/fatigue.  HENT: Negative.   Eyes: Negative.   Respiratory: Positive for shortness of breath.   Cardiovascular: Positive for chest pain.  Gastrointestinal: Negative.   Genitourinary:  Negative.   Musculoskeletal: Negative.   Skin: Negative.   Neurological: Negative.   Endo/Heme/Allergies: Negative.   Psychiatric/Behavioral: The patient is nervous/anxious.     Immunization History  Administered Date(s) Administered  . Tdap 01/02/2010   Objective:  BP 112/72 mmHg  Pulse 72  Temp(Src) 98 F (36.7 C) (Oral)  Resp 16  Wt 218 lb (98.884 kg)  Physical Exam  Constitutional: He is oriented to person, place, and time and well-developed, well-nourished, and in no distress.  Eyes: Conjunctivae and EOM are normal. Pupils are equal, round, and reactive to light.  Neck: Normal range of motion. Neck supple.  Cardiovascular: Normal rate, regular rhythm, normal heart sounds and intact distal pulses.   Pulmonary/Chest: Effort normal and breath sounds normal.  Abdominal: Soft. Bowel sounds are normal.  Musculoskeletal: Normal range of motion.  Neurological: He is alert and oriented to person, place, and time. He has normal reflexes. Gait normal. GCS score is 15.  Skin: Skin is warm and dry.  Psychiatric: Mood,  memory, affect and judgment normal.    Lab Results  Component Value Date   WBC 5.9 07/25/2014   HGB 14.9 12/29/2013   HCT 39.9 07/25/2014   PLT 219 05/17/2013   GLUCOSE 157* 06/26/2014   CHOL 168 06/26/2014   TRIG 107 06/26/2014   HDL 45 06/26/2014   LDLCALC 102* 06/26/2014   TSH 2.300 06/26/2014   PSA 2.0 06/26/2014   HGBA1C 7.3* 06/26/2014    CMP     Component Value Date/Time   NA 139 06/26/2014 0942   NA 135* 12/27/2013 1530   K 5.4* 06/26/2014 0942   CL 99 06/26/2014 0942   CO2 22 06/26/2014 0942   GLUCOSE 157* 06/26/2014 0942   GLUCOSE 124* 12/27/2013 1530   BUN 21 06/26/2014 0942   BUN 38* 12/27/2013 1530   CREATININE 0.93 06/26/2014 0942   CREATININE 1.2 05/17/2013   CALCIUM 9.9 06/26/2014 0942   PROT 7.7 06/26/2014 0942   ALBUMIN 4.9 06/26/2014 0942   AST 42* 06/26/2014 0942   ALT 49* 06/26/2014 0942   ALKPHOS 60 06/26/2014 0942    BILITOT 0.5 06/26/2014 0942   GFRNONAA 90 06/26/2014 0942   GFRAA 104 06/26/2014 0942    Assessment and Plan :  1. Type 2 diabetes mellitus without complication, without long-term current use of insulin (HCC)  - POCT HgB A1C- 7.4 today. Diet and exercise.  2. Essential hypertension stable  3. Depression, major, single episode, mild (HCC) stable  4. Chest pain, unspecified chest pain type Pt advised to take ASA daily. - EKG 12-Lead - Ambulatory referral to Cardiology, [place] since has not been since in a while since having chest pain.   5. Gastroesophageal reflux disease, esophagitis presence not specified Increase Omeprazole to twice daily. - omeprazole (PRILOSEC) 20 MG capsule; Take 1 capsule (20 mg total) by mouth 2 (two) times daily before a meal.  Dispense: 60 capsule; Refill: 12  6. Elevated liver enzymes  - Comprehensive metabolic panel  I have done the exam and reviewed the above chart and it is accurate to the best of my knowledge.  Patient was seen and examined by Dr. Julieanne Manson, and noted scribed by Dimas Chyle, CMA  Julieanne Manson MD [facility] 01/16/2015 8:17 AM

## 2015-01-17 LAB — COMPREHENSIVE METABOLIC PANEL
A/G RATIO: 1.8 (ref 1.1–2.5)
ALBUMIN: 4.6 g/dL (ref 3.5–5.5)
ALT: 40 IU/L (ref 0–44)
AST: 32 IU/L (ref 0–40)
Alkaline Phosphatase: 60 IU/L (ref 39–117)
BUN/Creatinine Ratio: 23 — ABNORMAL HIGH (ref 9–20)
BUN: 23 mg/dL (ref 6–24)
Bilirubin Total: 0.4 mg/dL (ref 0.0–1.2)
CO2: 23 mmol/L (ref 18–29)
CREATININE: 1.01 mg/dL (ref 0.76–1.27)
Calcium: 10 mg/dL (ref 8.7–10.2)
Chloride: 98 mmol/L (ref 96–106)
GFR calc Af Amer: 94 mL/min/{1.73_m2} (ref >59)
GFR calc non Af Amer: 81 mL/min/{1.73_m2} (ref >59)
GLOBULIN, TOTAL: 2.6 g/dL (ref 1.5–4.5)
Glucose: 169 mg/dL — ABNORMAL HIGH (ref 65–99)
POTASSIUM: 4.8 mmol/L (ref 3.5–5.2)
SODIUM: 137 mmol/L (ref 134–144)
Total Protein: 7.2 g/dL (ref 6.0–8.5)

## 2015-01-18 ENCOUNTER — Telehealth: Payer: Self-pay

## 2015-01-18 NOTE — Telephone Encounter (Signed)
Left message to call back  

## 2015-01-18 NOTE — Telephone Encounter (Signed)
Wife and patient Samuel Charsadvised-aa

## 2015-01-18 NOTE — Telephone Encounter (Signed)
-----   Message from Maple Hudsonichard L Gilbert Jr., MD sent at 01/18/2015  2:38 PM EST ----- normal

## 2015-02-07 ENCOUNTER — Other Ambulatory Visit: Payer: Self-pay | Admitting: Family Medicine

## 2015-03-06 ENCOUNTER — Ambulatory Visit: Payer: 59 | Admitting: Cardiovascular Disease

## 2015-03-14 ENCOUNTER — Encounter: Payer: Self-pay | Admitting: Family Medicine

## 2015-03-19 ENCOUNTER — Ambulatory Visit: Payer: 59 | Admitting: Family Medicine

## 2015-04-08 ENCOUNTER — Other Ambulatory Visit: Payer: Self-pay | Admitting: Family Medicine

## 2015-05-07 ENCOUNTER — Encounter: Payer: Self-pay | Admitting: Family Medicine

## 2015-05-07 ENCOUNTER — Ambulatory Visit (INDEPENDENT_AMBULATORY_CARE_PROVIDER_SITE_OTHER): Payer: 59 | Admitting: Family Medicine

## 2015-05-07 VITALS — BP 128/82 | HR 74 | Temp 98.1°F | Resp 18 | Wt 222.0 lb

## 2015-05-07 DIAGNOSIS — R059 Cough, unspecified: Secondary | ICD-10-CM

## 2015-05-07 DIAGNOSIS — R05 Cough: Secondary | ICD-10-CM

## 2015-05-07 MED ORDER — LEVALBUTEROL HCL 0.63 MG/3ML IN NEBU: 1.2500 mg | INHALATION_SOLUTION | Freq: Once | RESPIRATORY_TRACT | Status: DC

## 2015-05-07 MED ORDER — AZITHROMYCIN 250 MG PO TABS: ORAL_TABLET | ORAL | Status: DC

## 2015-05-07 MED ORDER — HYDROCOD POLST-CPM POLST ER 10-8 MG/5ML PO SUER: 5.0000 mL | Freq: Two times a day (BID) | ORAL | Status: DC | PRN

## 2015-05-07 NOTE — Patient Instructions (Signed)
Can try Robitussin DM during the day, push fluids.

## 2015-05-07 NOTE — Progress Notes (Signed)
Patient ID: Samuel MassedStephen W Macknight, male   DOB: [birth year], 60 y.o.   MRN: [id number]    Subjective:  HPI  Patient has had a cough for 1 week now. Non productive. Cough lasts all day and at night, he has heard some wheezing also. He denies any cold symptoms, no shortness of breath or chest tightness, no history of asthma and he does not smoke. Cough bothers him the same whether he is inside or outside, warm or cold air. He has taking cough drops and Delsym OTC.  Prior to Admission medications   Medication Sig Start Date End Date Taking? Authorizing Provider  ALPRAZolam Prudy Feeler(XANAX) 0.5 MG tablet Take 1 tablet by mouth every 8 (eight) hours as needed. 10/04/13  Yes Historical Provider, MD  amLODipine (NORVASC) 5 MG tablet TAKE 1 TABLET BY MOUTH DAILY 10/08/14  Yes Maple Hudsonichard L Gilbert Jr., MD  aspirin 325 MG tablet Take 1 tablet by mouth daily. 01/02/10  Yes Historical Provider, MD  atenolol (TENORMIN) 50 MG tablet TAKE 1 TABLET BY MOUTH EVERY DAY 02/07/15  Yes Richard Hulen ShoutsL Gilbert Jr., MD  KAZANO 12.05-998 MG TABS TAKE 1 TABLET BY MOUTH TWICE DAILY 04/08/15  Yes Maple Hudsonichard L Gilbert Jr., MD  lisinopril (PRINIVIL,ZESTRIL) 20 MG tablet TAKE 1 TABLET BY MOUTH DAILY. 12/13/14  Yes Richard Hulen ShoutsL Gilbert Jr., MD  MULTIPLE VITAMINS PO Take 1 tablet by mouth daily. 01/02/10  Yes Historical Provider, MD  nitroGLYCERIN (NITROSTAT) 0.4 MG SL tablet Place 0.4 mg under the tongue every 5 (five) minutes as needed.   Yes Historical Provider, MD  omeprazole (PRILOSEC) 20 MG capsule Take 1 capsule (20 mg total) by mouth 2 (two) times daily before a meal. 01/16/15  Yes Maple Hudsonichard L Gilbert Jr., MD  rosuvastatin (CRESTOR) 40 MG tablet Take 1 tablet (40 mg total) by mouth daily. 09/20/14  Yes Richard Hulen ShoutsL Gilbert Jr., MD  sertraline (ZOLOFT) 50 MG tablet TAKE 1 TABLET BY MOUTH EVERY NIGHT AT BEDTIME. 12/13/14  Yes Maple Hudsonichard L Gilbert Jr., MD    Patient Active Problem List   Diagnosis Date Noted  . Foot pain, right 07/25/2014  . AA (alopecia areata)  04/25/2014  . Angina of effort (HCC) 04/25/2014  . Arthritis 04/25/2014  . CAD in native artery 04/25/2014  . History of chicken pox 04/25/2014  . Abrasion of cornea 04/25/2014  . Depression, major, single episode, mild (HCC) 04/25/2014  . HLD (hyperlipidemia) 04/25/2014  . BP (high blood pressure) 04/25/2014  . Chest pain 02/09/2012  . CAD (coronary artery disease) 02/09/2012  . S/P coronary artery stent placement 02/09/2012  . Diabetes mellitus (HCC) 02/09/2012  . Hyperlipidemia 02/09/2012    Past Medical History  Diagnosis Date  . Type II or unspecified type diabetes mellitus without mention of complication, not stated as uncontrolled   . Other and unspecified hyperlipidemia   . Unspecified essential hypertension   . Status post cardiac catheterization 2003    [facility]  . Coronary atherosclerosis of native coronary artery 11/19/2004    2 stents [place] to [place]   . Wears glasses   . GERD (gastroesophageal reflux disease)   . Arthritis   . DDD (degenerative disc disease), lumbar   . Anxiety     Social History   Social History  . Marital Status: Married    Spouse Name: N/A  . Number of Children: N/A  . Years of Education: N/A   Occupational History  . Not on file.   Social History Main Topics  .  Smoking status: Former Smoker -- 2.00 packs/day for 8 years    Types: Cigarettes    Quit date: 02/08/1982  . Smokeless tobacco: Never Used  . Alcohol Use: No  . Drug Use: No  . Sexual Activity: Not on file   Other Topics Concern  . Not on file   Social History Narrative    Allergies  Allergen Reactions  . Prednisone Other (See Comments)    Makes him very mean    Review of Systems  Constitutional: Negative.   HENT: Negative.   Respiratory: Positive for cough and wheezing. Negative for sputum production and shortness of breath.   Cardiovascular: Negative.   Gastrointestinal: Negative.   Musculoskeletal: Negative.   Neurological: Negative.       Immunization History  Administered Date(s) Administered  . Tdap 01/02/2010   Objective:  BP 128/82 mmHg  Pulse 74  Temp(Src) 98.1 F (36.7 C)  Resp 18  Wt 222 lb (100.699 kg)  SpO2 98%  Physical Exam  Constitutional: He is oriented to person, place, and time and well-developed, well-nourished, and in no distress.  HENT:  Head: Normocephalic and atraumatic.  Right Ear: External ear normal.  Left Ear: External ear normal.  Mouth/Throat: Oropharynx is clear and moist.  Eyes: Conjunctivae are normal. Pupils are equal, round, and reactive to light.  Neck: Normal range of motion. Neck supple.  Cardiovascular: Normal rate, regular rhythm, normal heart sounds and intact distal pulses.   No murmur heard. Pulmonary/Chest: Effort normal and breath sounds normal. No respiratory distress. He has no wheezes.  Neurological: He is alert and oriented to person, place, and time.  Psychiatric: Mood, memory, affect and judgment normal.    Lab Results  Component Value Date   WBC 5.9 07/25/2014   HGB 14.9 12/29/2013   HCT 39.9 07/25/2014   PLT 222 07/25/2014   GLUCOSE 169* 01/16/2015   CHOL 168 06/26/2014   TRIG 107 06/26/2014   HDL 45 06/26/2014   LDLCALC 102* 06/26/2014   TSH 2.300 06/26/2014   HGBA1C 7.4 01/16/2015    CMP     Component Value Date/Time   NA 137 01/16/2015 0923   NA 135* 12/27/2013 1530   K 4.8 01/16/2015 0923   CL 98 01/16/2015 0923   CO2 23 01/16/2015 0923   GLUCOSE 169* 01/16/2015 0923   GLUCOSE 124* 12/27/2013 1530   BUN 23 01/16/2015 0923   BUN 38* 12/27/2013 1530   CREATININE 1.01 01/16/2015 0923   CREATININE 1.2 05/17/2013   CALCIUM 10.0 01/16/2015 0923   PROT 7.2 01/16/2015 0923   ALBUMIN 4.6 01/16/2015 0923   AST 32 01/16/2015 0923   ALT 40 01/16/2015 0923   ALKPHOS 60 01/16/2015 0923   BILITOT 0.4 01/16/2015 0923   GFRNONAA 81 01/16/2015 0923   GFRAA 94 01/16/2015 0923    Assessment and Plan :  1. Cough I think this is all viral but  will provide RX for Zpak in case symptoms get worse. Try Tussionex for the cough. Push fluids and can use Robitussin DM. Advised patient that cough may last for another week or so. - chlorpheniramine-HYDROcodone (TUSSIONEX PENNKINETIC ER) 10-8 MG/5ML SUER; Take 5 mLs by mouth every 12 (twelve) hours as needed for cough.  Dispense: 140 mL; Refill: 0 2.URI I have done the exam and reviewed the above chart and it is accurate to the best of my knowledge.  Patient was seen and examined by Dr. Bosie Clos and note was scribed by Samara Deist, RMA.  Julieanne Manson MD [facility] 05/07/2015 3:20 PM

## 2015-05-13 ENCOUNTER — Other Ambulatory Visit: Payer: Self-pay | Admitting: Family Medicine

## 2015-08-21 ENCOUNTER — Encounter: Payer: Self-pay | Admitting: Family Medicine

## 2015-08-21 ENCOUNTER — Ambulatory Visit (INDEPENDENT_AMBULATORY_CARE_PROVIDER_SITE_OTHER): Payer: 59 | Admitting: Family Medicine

## 2015-08-21 VITALS — BP 128/64 | HR 74 | Temp 97.4°F | Resp 16 | Ht 71.0 in | Wt 227.0 lb

## 2015-08-21 DIAGNOSIS — R5383 Other fatigue: Secondary | ICD-10-CM | POA: Diagnosis not present

## 2015-08-21 DIAGNOSIS — K219 Gastro-esophageal reflux disease without esophagitis: Secondary | ICD-10-CM | POA: Diagnosis not present

## 2015-08-21 DIAGNOSIS — E119 Type 2 diabetes mellitus without complications: Secondary | ICD-10-CM

## 2015-08-21 DIAGNOSIS — Z0000 Encounter for general adult medical examination without abnormal findings: Secondary | ICD-10-CM | POA: Diagnosis not present

## 2015-08-21 LAB — POCT URINALYSIS DIPSTICK
Bilirubin, UA: NEGATIVE
Blood, UA: NEGATIVE
Glucose, UA: 250
Ketones, UA: NEGATIVE
LEUKOCYTES UA: NEGATIVE
NITRITE UA: NEGATIVE
PROTEIN UA: NEGATIVE
Spec Grav, UA: 1.025
UROBILINOGEN UA: 0.2
pH, UA: 6

## 2015-08-21 LAB — POCT UA - MICROALBUMIN: MICROALBUMIN (UR) POC: 50 mg/L

## 2015-08-21 MED ORDER — RANITIDINE HCL 150 MG PO TABS
150.0000 mg | ORAL_TABLET | Freq: Every day | ORAL | 12 refills | Status: DC
Start: 2015-08-21 — End: 2018-02-17

## 2015-08-21 NOTE — Progress Notes (Signed)
Patient: Samuel MassedStephen W Mannes, Male    DOB: Nov 21, 1955, 60 y.o.   MRN: [id number] Visit Date: 08/21/2015  Today's Provider: Megan Mansichard Gilbert Jr, MD   Chief Complaint  Patient presents with  . Annual Exam   Subjective:    Annual physical exam Samuel Gentry is a 60 y.o. male who presents today for health maintenance and complete physical. He feels fairly well. He reports exercising about 4 days a week for an hour. He reports he is sleeping well.  ----------------------------------------------------------------- Colonoscopy- never but would like to get one set up.  He wants to hold off on a shingles vaccine at this time.   Immunization History  Administered Date(s) Administered  . Tdap 01/02/2010     Review of Systems  Constitutional: Positive for fatigue.  HENT: Negative.   Eyes: Negative.   Respiratory: Negative.   Cardiovascular: Negative.   Gastrointestinal: Negative.        Deep "hiccup" after eating and regurgitation sometimes at night   Endocrine: Negative.   Genitourinary: Negative.   Musculoskeletal: Negative.   Skin: Negative.   Allergic/Immunologic: Negative.   Neurological: Negative.   Hematological: Negative.   Psychiatric/Behavioral: Negative.     Social History      He  reports that he quit smoking about 33 years ago. His smoking use included Cigarettes. He has a 16.00 pack-year smoking history. He has never used smokeless tobacco. He reports that he does not drink alcohol or use drugs.       Social History   Social History  . Marital status: Married    Spouse name: N/A  . Number of children: N/A  . Years of education: N/A   Social History Main Topics  . Smoking status: Former Smoker    Packs/day: 2.00    Years: 8.00    Types: Cigarettes    Quit date: 02/08/1982  . Smokeless tobacco: Never Used  . Alcohol use No  . Drug use: No  . Sexual activity: Not Asked   Other Topics Concern  . None   Social History Narrative  . None     Past Medical History:  Diagnosis Date  . Anxiety   . Arthritis   . Coronary atherosclerosis of native coronary artery 11/19/2004   2 stents placed to the [place]   . DDD (degenerative disc disease), lumbar   . GERD (gastroesophageal reflux disease)   . Other and unspecified hyperlipidemia   . Status post cardiac catheterization 2003   [facility]  . Type II or unspecified type diabetes mellitus without mention of complication, not stated as uncontrolled   . Unspecified essential hypertension   . Wears glasses      Patient Active Problem List   Diagnosis Date Noted  . Foot pain, right 07/25/2014  . AA (alopecia areata) 04/25/2014  . Angina of effort (HCC) 04/25/2014  . Arthritis 04/25/2014  . CAD in native artery 04/25/2014  . History of chicken pox 04/25/2014  . Abrasion of cornea 04/25/2014  . Depression, major, single episode, mild (HCC) 04/25/2014  . HLD (hyperlipidemia) 04/25/2014  . BP (high blood pressure) 04/25/2014  . Chest pain 02/09/2012  . CAD (coronary artery disease) 02/09/2012  . S/P coronary artery stent placement 02/09/2012  . Diabetes mellitus (HCC) 02/09/2012  . Hyperlipidemia 02/09/2012    Past Surgical History:  Procedure Laterality Date  . ANKLE FUSION  2003   left ankle  . BACK SURGERY  93,97   lumb x2  .  CARDIAC CATHETERIZATION  2003  . CORONARY ANGIOPLASTY  2006  . DORSAL COMPARTMENT RELEASE Left 12/29/2013   Procedure: LEFT WRIST RELEASE DORSAL COMPARTMENT (DEQUERVAIN);  Surgeon: Jodi Marble, MD;  Location: [facility];  Service: Orthopedics;  Laterality: Left;  . KNEE SURGERY  1978   rt  . NECK SURGERY  2010    Family History        Family Status  Relation Status  . Mother Alive  . Father Deceased   diabetes  . Brother Alive        His family history includes Diabetes in his father; Hyperlipidemia in his mother.    Allergies  Allergen Reactions  . Prednisone Other (See Comments)    Makes him  very mean    Current Meds  Medication Sig  . ALPRAZolam (XANAX) 0.5 MG tablet Take 1 tablet by mouth every 8 (eight) hours as needed.  Marland Kitchen amLODipine (NORVASC) 5 MG tablet TAKE 1 TABLET BY MOUTH DAILY  . aspirin 325 MG tablet Take 1 tablet by mouth daily.  Marland Kitchen atenolol (TENORMIN) 50 MG tablet TAKE 1 TABLET BY MOUTH EVERY DAY  . KAZANO 12.05-998 MG TABS TAKE 1 TABLET BY MOUTH TWICE DAILY  . lisinopril (PRINIVIL,ZESTRIL) 20 MG tablet TAKE 1 TABLET BY MOUTH DAILY.  . MULTIPLE VITAMINS PO Take 1 tablet by mouth daily.  . nitroGLYCERIN (NITROSTAT) 0.4 MG SL tablet Place 0.4 mg under the tongue every 5 (five) minutes as needed.  Marland Kitchen omeprazole (PRILOSEC) 20 MG capsule Take 1 capsule (20 mg total) by mouth 2 (two) times daily before a meal.  . rosuvastatin (CRESTOR) 40 MG tablet Take 1 tablet (40 mg total) by mouth daily.  . sertraline (ZOLOFT) 50 MG tablet TAKE 1 TABLET BY MOUTH EVERY NIGHT AT BEDTIME.   Current Facility-Administered Medications for the 08/21/15 encounter (Office Visit) with Maple Hudson., MD  Medication  . levalbuterol (XOPENEX) nebulizer solution 1.25 mg    Patient Care Team: Maple Hudson., MD as PCP - General (Unknown Physician Specialty) Antonieta Iba, MD as Consulting Physician (Cardiology)     Objective:   Vitals: BP 128/64 (BP Location: Left Arm, Patient Position: Sitting, Cuff Size: Large)   Pulse 74   Temp 97.4 F (36.3 C) (Oral)   Resp 16   Ht  (1.803 m)   Wt 227 lb (103 kg)   BMI 31.66 kg/m    Physical Exam  Constitutional: He is oriented to person, place, and time. He appears well-developed and well-nourished.  HENT:  Head: Normocephalic and atraumatic.  Right Ear: External ear normal.  Left Ear: External ear normal.  Nose: Nose normal.  Mouth/Throat: Oropharynx is clear and moist.  Eyes: Conjunctivae and EOM are normal. Pupils are equal, round, and reactive to light.  Neck: Normal range of motion. Neck supple.  Cardiovascular:  Normal rate, regular rhythm, normal heart sounds and intact distal pulses.   Pulmonary/Chest: Effort normal and breath sounds normal.  Abdominal: Soft. Bowel sounds are normal.  Musculoskeletal: Normal range of motion.  Neurological: He is alert and oriented to person, place, and time. He has normal reflexes.  Skin: Skin is warm and dry.  Psychiatric: He has a normal mood and affect. His behavior is normal. Judgment and thought content normal.     Depression Screen No flowsheet data found.    Assessment & Plan:     Routine Health Maintenance and Physical Exam  Exercise Activities and Dietary recommendations Goals  None      Immunization History  Administered Date(s) Administered  . Tdap 01/02/2010  Refer to GI for initial colonoscopy. Patient has always declined this in the past.  Health Maintenance  Topic Date Due  . Hepatitis C Screening  September 05, 1955  . PNEUMOCOCCAL POLYSACCHARIDE VACCINE (1) 08/25/1957  . FOOT EXAM  08/25/1965  . OPHTHALMOLOGY EXAM  08/25/1965  . HIV Screening  08/26/1970  . COLONOSCOPY  08/25/2005  . HEMOGLOBIN A1C  07/16/2015  . INFLUENZA VACCINE  01/16/2016 (Originally 08/13/2015)  . TETANUS/TDAP  01/03/2020   1. Type 2 diabetes mellitus without complication, without long-term current use of insulin (HCC)  - Hemoglobin A1c - POCT UA - Microalbumin  2. Gastroesophageal reflux disease, esophagitis presence not specified Start Ranitidine 150 mg qhs  3. Other fatigue  - Testosterone,Free and Total  4. Annual physical exam  - CBC with Differential/Platelet - Lipid Panel With LDL/HDL Ratio - TSH - PSA - POCT urinalysis dipstick - Comprehensive metabolic panel    Discussed health benefits of physical activity, and encouraged him to engage in regular exercise appropriate for his age and condition.    --------------------------------------------------------------------    Megan Mans, MD  [facility]

## 2015-08-22 LAB — COMPREHENSIVE METABOLIC PANEL
ALBUMIN: 4.9 g/dL (ref 3.5–5.5)
ALK PHOS: 67 IU/L (ref 39–117)
ALT: 52 IU/L — ABNORMAL HIGH (ref 0–44)
AST: 41 IU/L — ABNORMAL HIGH (ref 0–40)
Albumin/Globulin Ratio: 1.8 (ref 1.2–2.2)
BUN/Creatinine Ratio: 20 (ref 9–20)
BUN: 25 mg/dL — AB (ref 6–24)
Bilirubin Total: 0.4 mg/dL (ref 0.0–1.2)
CO2: 21 mmol/L (ref 18–29)
CREATININE: 1.25 mg/dL (ref 0.76–1.27)
Calcium: 10.4 mg/dL — ABNORMAL HIGH (ref 8.7–10.2)
Chloride: 94 mmol/L — ABNORMAL LOW (ref 96–106)
GFR calc non Af Amer: 63 mL/min/{1.73_m2} (ref >59)
GFR, EST AFRICAN AMERICAN: 72 mL/min/{1.73_m2} (ref >59)
GLUCOSE: 227 mg/dL — AB (ref 65–99)
Globulin, Total: 2.8 g/dL (ref 1.5–4.5)
Potassium: 5.4 mmol/L — ABNORMAL HIGH (ref 3.5–5.2)
Sodium: 134 mmol/L (ref 134–144)
TOTAL PROTEIN: 7.7 g/dL (ref 6.0–8.5)

## 2015-08-22 LAB — HEMOGLOBIN A1C
Est. average glucose Bld gHb Est-mCnc: 229 mg/dL
Hgb A1c MFr Bld: 9.6 % — ABNORMAL HIGH (ref 4.8–5.6)

## 2015-08-22 LAB — TESTOSTERONE,FREE AND TOTAL
TESTOSTERONE: 189 ng/dL — AB (ref 264–916)
Testosterone, Free: 6.2 pg/mL — ABNORMAL LOW (ref 7.2–24.0)

## 2015-08-22 LAB — CBC WITH DIFFERENTIAL/PLATELET
BASOS ABS: 0 10*3/uL (ref 0.0–0.2)
Basos: 0 %
EOS (ABSOLUTE): 0.2 10*3/uL (ref 0.0–0.4)
Eos: 3 %
Hematocrit: 42.7 % (ref 37.5–51.0)
Hemoglobin: 14.5 g/dL (ref 12.6–17.7)
IMMATURE GRANS (ABS): 0 10*3/uL (ref 0.0–0.1)
IMMATURE GRANULOCYTES: 1 %
Lymphocytes Absolute: 1.6 10*3/uL (ref 0.7–3.1)
Lymphs: 27 %
MCH: 29.2 pg (ref 26.6–33.0)
MCHC: 34 g/dL (ref 31.5–35.7)
MCV: 86 fL (ref 79–97)
Monocytes Absolute: 0.5 10*3/uL (ref 0.1–0.9)
Monocytes: 8 %
NEUTROS ABS: 3.6 10*3/uL (ref 1.4–7.0)
Neutrophils: 61 %
Platelets: 213 10*3/uL (ref 150–379)
RBC: 4.96 x10E6/uL (ref 4.14–5.80)
RDW: 13.5 % (ref 12.3–15.4)
WBC: 5.9 10*3/uL (ref 3.4–10.8)

## 2015-08-22 LAB — LIPID PANEL WITH LDL/HDL RATIO
CHOLESTEROL TOTAL: 180 mg/dL (ref 100–199)
HDL: 36 mg/dL — ABNORMAL LOW (ref >39)
LDL Calculated: 108 mg/dL — ABNORMAL HIGH (ref 0–99)
LDl/HDL Ratio: 3 ratio units (ref 0.0–3.6)
TRIGLYCERIDES: 181 mg/dL — AB (ref 0–149)
VLDL Cholesterol Cal: 36 mg/dL (ref 5–40)

## 2015-08-22 LAB — PSA: Prostate Specific Ag, Serum: 1.4 ng/mL (ref 0.0–4.0)

## 2015-08-22 LAB — TSH: TSH: 2.36 u[IU]/mL (ref 0.450–4.500)

## 2015-09-18 ENCOUNTER — Encounter: Payer: Self-pay | Admitting: General Surgery

## 2015-09-18 ENCOUNTER — Ambulatory Visit (INDEPENDENT_AMBULATORY_CARE_PROVIDER_SITE_OTHER): Payer: 59 | Admitting: Family Medicine

## 2015-09-18 ENCOUNTER — Encounter: Payer: Self-pay | Admitting: Family Medicine

## 2015-09-18 VITALS — BP 124/68 | HR 80 | Temp 97.9°F | Resp 18 | Wt 226.0 lb

## 2015-09-18 DIAGNOSIS — K439 Ventral hernia without obstruction or gangrene: Secondary | ICD-10-CM

## 2015-09-18 NOTE — Progress Notes (Addendum)
Patient: Samuel Gentry Male    DOB: 06-20-1955   60 y.o.   MRN: [id number] Visit Date: 09/18/2015  Today's Provider: Megan Mansichard Zakery Normington Jr, MD   Chief Complaint  Patient presents with  . Abdominal Pain    patient describes it as a "knot". About 2-3 weeks ago.    Subjective:    HPI Patient comes in today with a "knot" in the center of his abdomen. He reports that it has been there for the last 2-3 weeks. Patient reports that in the last week, his abdomen has felt more tight and distended. Patient reports that this happened all of a sudden. He denies doing any heavy lifting. He reports that he still has normal bowel movements and he denies any dysuria. Patient reports that it is tender when palpated. He has never had this before in the past.     Allergies  Allergen Reactions  . Prednisone Other (See Comments)    Makes him very mean     Current Outpatient Prescriptions:  .  ALPRAZolam (XANAX) 0.5 MG tablet, Take 1 tablet by mouth every 8 (eight) hours as needed., Disp: , Rfl:  .  amLODipine (NORVASC) 5 MG tablet, TAKE 1 TABLET BY MOUTH DAILY, Disp: 30 tablet, Rfl: 12 .  aspirin 325 MG tablet, Take 1 tablet by mouth daily., Disp: , Rfl:  .  atenolol (TENORMIN) 50 MG tablet, TAKE 1 TABLET BY MOUTH EVERY DAY, Disp: 90 tablet, Rfl: 3 .  KAZANO 12.05-998 MG TABS, TAKE 1 TABLET BY MOUTH TWICE DAILY, Disp: 60 tablet, Rfl: 12 .  lisinopril (PRINIVIL,ZESTRIL) 20 MG tablet, TAKE 1 TABLET BY MOUTH DAILY., Disp: 30 tablet, Rfl: 12 .  MULTIPLE VITAMINS PO, Take 1 tablet by mouth daily., Disp: , Rfl:  .  nitroGLYCERIN (NITROSTAT) 0.4 MG SL tablet, Place 0.4 mg under the tongue every 5 (five) minutes as needed., Disp: , Rfl:  .  omeprazole (PRILOSEC) 20 MG capsule, Take 1 capsule (20 mg total) by mouth 2 (two) times daily before a meal., Disp: 60 capsule, Rfl: 12 .  ranitidine (ZANTAC) 150 MG tablet, Take 1 tablet (150 mg total) by mouth at bedtime., Disp: 30 tablet, Rfl: 12 .  rosuvastatin  (CRESTOR) 40 MG tablet, Take 1 tablet (40 mg total) by mouth daily., Disp: 30 tablet, Rfl: 12 .  sertraline (ZOLOFT) 50 MG tablet, TAKE 1 TABLET BY MOUTH EVERY NIGHT AT BEDTIME., Disp: 30 tablet, Rfl: 12  Current Facility-Administered Medications:  .  levalbuterol (XOPENEX) nebulizer solution 1.25 mg, 1.25 mg, Nebulization, Once, Arthea Nobel L Wendelyn BreslowGilbert Jr., MD  Review of Systems  Constitutional: Negative.   Respiratory: Negative.   Cardiovascular: Negative.   Gastrointestinal: Positive for abdominal distention and abdominal pain. Negative for anal bleeding, blood in stool, constipation, diarrhea, nausea, rectal pain and vomiting.  Musculoskeletal: Negative.   Skin: Negative.     Social History  Substance Use Topics  . Smoking status: Former Smoker    Packs/day: 2.00    Years: 8.00    Types: Cigarettes    Quit date: 02/08/1982  . Smokeless tobacco: Never Used  . Alcohol use No   Objective:   BP 124/68   Pulse 80   Temp 97.9 F (36.6 C)   Resp 18   Wt 226 lb (102.5 kg)   BMI 31.52 kg/m   Physical Exam  Constitutional: He is oriented to person, place, and time. He appears well-developed and well-nourished.  HENT:  Head: Normocephalic and atraumatic.  Neck: Neck supple.  Cardiovascular: Normal rate, regular rhythm and normal heart sounds.   Pulmonary/Chest: Effort normal and breath sounds normal.  Abdominal: Soft. Bowel sounds are normal. There is tenderness.  Supcisions of ventral hernia vs rectus diathesis.   Neurological: He is alert and oriented to person, place, and time.  Skin: Skin is warm and dry.  Psychiatric: He has a normal mood and affect. His behavior is normal. Judgment and thought content normal.        Assessment & Plan:     1. Ventral hernia, recurrence not specified Refer to general surgery as below. Possible rectus diathesis. - Ambulatory referral to General Surgery 2. CAD 3. Type 2 diabetes I have done the exam and reviewed the above chart and it is  accurate to the best of my knowledge.        Assyria Morreale Wendelyn Breslow, MD  [facility]

## 2015-09-22 ENCOUNTER — Other Ambulatory Visit: Payer: Self-pay | Admitting: Family Medicine

## 2015-10-02 ENCOUNTER — Encounter: Payer: Self-pay | Admitting: General Surgery

## 2015-10-02 ENCOUNTER — Ambulatory Visit (INDEPENDENT_AMBULATORY_CARE_PROVIDER_SITE_OTHER): Payer: 59 | Admitting: General Surgery

## 2015-10-02 VITALS — BP 122/82 | HR 68 | Resp 14 | Ht 71.0 in | Wt 223.0 lb

## 2015-10-02 DIAGNOSIS — R1010 Upper abdominal pain, unspecified: Secondary | ICD-10-CM | POA: Diagnosis not present

## 2015-10-02 DIAGNOSIS — M6208 Separation of muscle (nontraumatic), other site: Secondary | ICD-10-CM | POA: Diagnosis not present

## 2015-10-02 MED ORDER — POLYETHYLENE GLYCOL 3350 17 GM/SCOOP PO POWD: 1.0000 | Freq: Once | ORAL | 0 refills | Status: AC

## 2015-10-02 NOTE — Progress Notes (Signed)
Patient ID: Samuel Gentry, male   DOB: [birth year], 60 y.o.   MRN: [id number]  Chief Complaint  Patient presents with  . Other    ventral hernia    HPI Samuel Gentry is a 60 y.o. male here today for a evaluation of a ventral hernia. Patient states he noticed this area about a month ago. He states the bulge and pain is upper abdomen. He thinks the pain came before the bulge but thought it was reflux. He states the pain can last from hours to all day. Does not seem to be triggered by foods or activity. Bowels are regular and no bleeding. Foods trigger pressureIn the upper abdomen and a "heavy hiccup". He does feel his weight is up.  He went from a physical strenuous job to less a strenuous job back in January. 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  HPI  Past Medical History:  Diagnosis Date  . Anxiety   . Arthritis   . Coronary atherosclerosis of native coronary artery 11/19/2004   2 stents placed to the LAD in [place], [place].   . DDD (degenerative disc disease), lumbar   . GERD (gastroesophageal reflux disease)   . Other and unspecified hyperlipidemia   . Status post cardiac catheterization 2003   [facility]  . Type II or unspecified type diabetes mellitus without mention of complication, not stated as uncontrolled   . Unspecified essential hypertension   . Wears glasses     Past Surgical History:  Procedure Laterality Date  . ANKLE FUSION  2003   left ankle  . BACK SURGERY  93,97   lumb x2  . CARDIAC CATHETERIZATION  2003  . CORONARY ANGIOPLASTY  2006  . DORSAL COMPARTMENT RELEASE Left 12/29/2013   Procedure: LEFT WRIST RELEASE DORSAL COMPARTMENT (DEQUERVAIN);  Surgeon: Jolyn Nap, MD;  Location: [place];  Service: Orthopedics;  Laterality: Left;  . KNEE SURGERY  1978   rt  . NECK SURGERY  2010    Family History  Problem Relation Age of Onset  . Hyperlipidemia Mother   . Diabetes Father     Social History Social History  Substance Use Topics  .  Smoking status: Former Smoker    Packs/day: 2.00    Years: 8.00    Types: Cigarettes    Quit date: 02/08/1982  . Smokeless tobacco: Never Used  . Alcohol use No    Allergies  Allergen Reactions  . Prednisone Other (See Comments)    Makes him very mean    Current Outpatient Prescriptions  Medication Sig Dispense Refill  . ALPRAZolam (XANAX) 0.5 MG tablet Take 1 tablet by mouth every 8 (eight) hours as needed.    Marland Kitchen amLODipine (NORVASC) 5 MG tablet TAKE 1 TABLET BY MOUTH DAILY 30 tablet 12  . aspirin 325 MG tablet Take 1 tablet by mouth daily.    Marland Kitchen atenolol (TENORMIN) 50 MG tablet TAKE 1 TABLET BY MOUTH EVERY DAY 90 tablet 3  . KAZANO 12.05-998 MG TABS TAKE 1 TABLET BY MOUTH TWICE DAILY 60 tablet 12  . lisinopril (PRINIVIL,ZESTRIL) 20 MG tablet TAKE 1 TABLET BY MOUTH DAILY. 30 tablet 12  . MULTIPLE VITAMINS PO Take 1 tablet by mouth daily.    . nitroGLYCERIN (NITROSTAT) 0.4 MG SL tablet Place 0.4 mg under the tongue every 5 (five) minutes as needed.    Marland Kitchen omeprazole (PRILOSEC) 20 MG capsule Take 1 capsule (20 mg total) by mouth 2 (two) times daily before a meal. 60 capsule 12  .  ranitidine (ZANTAC) 150 MG tablet Take 1 tablet (150 mg total) by mouth at bedtime. 30 tablet 12  . rosuvastatin (CRESTOR) 40 MG tablet TAKE 1 TABLET(40 MG) BY MOUTH DAILY 30 tablet 12  . sertraline (ZOLOFT) 50 MG tablet TAKE 1 TABLET BY MOUTH EVERY NIGHT AT BEDTIME. 30 tablet 12   Current Facility-Administered Medications  Medication Dose Route Frequency Provider Last Rate Last Dose  . levalbuterol (XOPENEX) nebulizer solution 1.25 mg  1.25 mg Nebulization Once Jerrol Banana., MD        Review of Systems Review of Systems  Constitutional: Negative.   Respiratory: Negative.   Cardiovascular: Negative.   Gastrointestinal: Negative for constipation, diarrhea, nausea and vomiting.    Blood pressure 122/82, pulse 68, resp. rate 14, height 5' 11"  (1.803 m), weight 223 lb (101.2 kg).  Physical  Exam Physical Exam  Constitutional: He is oriented to person, place, and time. He appears well-developed and well-nourished.  HENT:  Mouth/Throat: Oropharynx is clear and moist.  Eyes: Conjunctivae are normal. No scleral icterus.  Neck: Neck supple.  Cardiovascular: Normal rate and regular rhythm.   Murmur heard.  Systolic murmur is present with a grade of 1/6  Pulmonary/Chest: Effort normal and breath sounds normal.  Abdominal: Soft. Normal appearance and bowel sounds are normal. There is tenderness in the left upper quadrant. Hernia confirmed negative in the right inguinal area and confirmed negative in the left inguinal area.  Diastasis recti present.  Lymphadenopathy:    He has no cervical adenopathy.  Neurological: He is alert and oriented to person, place, and time.  Skin: Skin is warm and dry.  Psychiatric: His behavior is normal.    Data Reviewed PCP notes of 09/18/2015.  Assessment    Diastases recti, no evidence of ventral hernia.  Unexplained abdominal pain.    Plan          CT scan of the abdomen and pelvis is been recommended due to his reports of pain and a negative exam.  Upper endoscopy and colonoscopy with possible biopsy/polypectomy prn: Information regarding the procedure, including its potential risks and complications (including but not limited to perforation of the bowel, which may require emergency surgery to repair, and bleeding) was verbally given to the patient. Educational information regarding lower intestinal endoscopy was given to the patient. Written instructions for how to complete the bowel prep using Miralax were provided. The importance of drinking ample fluids to avoid dehydration as a result of the prep emphasized.  The patient is scheduled for a Colonoscopy and upper endoscopy at [facility] on 11/06/15. They are aware to call the day before to get their arrival time. He will hold his Colvin Caroli the day of prep and procedure. He will only take his  blood pressure medication the morning of with a sip of water at 6 am. Miralax prescription has been sent into the patient's pharmacy. The patient is aware of date and instructions.  He is scheduled for a CT at [place] on 10/09/15 at 8:30 am. He will pick up a prep kit. He will arrive at 8:15 am and have nothing to eat or drink for 4 hours prior. The patient is aware of date, time, and instructions.    This information has been scribed by Karie Fetch RN, BSN,BC.   Samuel Gentry 10/03/2015, 7:50 AM

## 2015-10-02 NOTE — Patient Instructions (Addendum)
The patient is aware to call back for any questions or concerns.  Colonoscopy A colonoscopy is an exam to look at the entire large intestine (colon). This exam can help find problems such as tumors, polyps, inflammation, and areas of bleeding. The exam takes about 1 hour.  LET [facility] CARE PROVIDER KNOW ABOUT:   Any allergies you have.  All medicines you are taking, including vitamins, herbs, eye drops, creams, and over-the-counter medicines.  Previous problems you or members of your family have had with the use of anesthetics.  Any blood disorders you have.  Previous surgeries you have had.  Medical conditions you have. RISKS AND COMPLICATIONS  Generally, this is a safe procedure. However, as with any procedure, complications can occur. Possible complications include:  Bleeding.  Tearing or rupture of the colon wall.  Reaction to medicines given during the exam.  Infection (rare). BEFORE THE PROCEDURE   Ask your health care provider about changing or stopping your regular medicines.  You may be prescribed an oral bowel prep. This involves drinking a large amount of medicated liquid, starting the day before your procedure. The liquid will cause you to have multiple loose stools until your stool is almost clear or light green. This cleans out your colon in preparation for the procedure.  Do not eat or drink anything else once you have started the bowel prep, unless your health care provider tells you it is safe to do so.  Arrange for someone to drive you home after the procedure. PROCEDURE   You will be given medicine to help you relax (sedative).  You will lie on your side with your knees bent.  A long, flexible tube with a light and camera on the end (colonoscope) will be inserted through the rectum and into the colon. The camera sends video back to a computer screen as it moves through the colon. The colonoscope also releases carbon dioxide gas to inflate the colon.  This helps your health care provider see the area better.  During the exam, your health care provider may take a small tissue sample (biopsy) to be examined under a microscope if any abnormalities are found.  The exam is finished when the entire colon has been viewed. AFTER THE PROCEDURE   Do not drive for 24 hours after the exam.  You may have a small amount of blood in your stool.  You may pass moderate amounts of gas and have mild abdominal cramping or bloating. This is caused by the gas used to inflate your colon during the exam.  Ask when your test results will be ready and how you will get your results. Make sure you get your test results.   This information is not intended to replace advice given to you by your health care provider. Make sure you discuss any questions you have with your health care provider.   Document Released: 12/27/1999 Document Revised: 10/19/2012 Document Reviewed: 09/05/2012 Elsevier Interactive Patient Education [employer].  The patient is scheduled for a Colonoscopy and upper endoscopy at [facility] on 11/06/15. They are aware to call the day before to get their arrival time. He will hold his Colvin Caroli the day of prep and procedure. He will only take his blood pressure medication the morning of with a sip of water at 6 am. Miralax prescription has been sent into the patient's pharmacy. The patient is aware of date and instructions.  He is scheduled for a CT at [place] on 10/09/15 at  8:30 am. He will pick up a prep kit. He will arrive at 8:15 am and have nothing to eat or drink for 4 hours prior. The patient is aware of date, time, and instructions.

## 2015-10-09 ENCOUNTER — Ambulatory Visit
Admission: RE | Admit: 2015-10-09 | Discharge: 2015-10-09 | Disposition: A | Discharge status: Home / Self Care | Payer: 59 | Source: Ambulatory Visit | Attending: General Surgery | Admitting: General Surgery

## 2015-10-09 ENCOUNTER — Telehealth: Payer: Self-pay | Admitting: *Deleted

## 2015-10-09 DIAGNOSIS — R911 Solitary pulmonary nodule: Secondary | ICD-10-CM | POA: Diagnosis not present

## 2015-10-09 DIAGNOSIS — K760 Fatty (change of) liver, not elsewhere classified: Secondary | ICD-10-CM | POA: Insufficient documentation

## 2015-10-09 DIAGNOSIS — K5730 Diverticulosis of large intestine without perforation or abscess without bleeding: Secondary | ICD-10-CM | POA: Insufficient documentation

## 2015-10-09 DIAGNOSIS — R1010 Upper abdominal pain, unspecified: Secondary | ICD-10-CM

## 2015-10-09 DIAGNOSIS — K439 Ventral hernia without obstruction or gangrene: Secondary | ICD-10-CM | POA: Diagnosis not present

## 2015-10-09 DIAGNOSIS — N200 Calculus of kidney: Secondary | ICD-10-CM | POA: Insufficient documentation

## 2015-10-09 DIAGNOSIS — I70209 Unspecified atherosclerosis of native arteries of extremities, unspecified extremity: Secondary | ICD-10-CM | POA: Insufficient documentation

## 2015-10-09 LAB — POCT I-STAT CREATININE: Creatinine, Ser: 1.1 mg/dL (ref 0.61–1.24)

## 2015-10-09 MED ORDER — IOPAMIDOL (ISOVUE-370) INJECTION 76%
100.0000 mL | Freq: Once | INTRAVENOUS | Status: AC | PRN
  Administered 2015-10-09: 100 mL via INTRAVENOUS

## 2015-10-09 NOTE — Telephone Encounter (Signed)
-----   Message from Earline MayotteJeffrey W Byrnett, MD sent at 10/09/2015 11:56 AM EDT ----- Please notify CT fine, nothing to explain present discomfort.  Will proceed with colonoscopy in October as planned.  ----- Message ----- From: Interface, Rad Results In Sent: 10/09/2015   9:37 AM To: Earline MayotteJeffrey W Byrnett, MD

## 2015-10-09 NOTE — Telephone Encounter (Signed)
Patient notified, verbalized understanding. Patient agrees to proceed with colonoscopy.

## 2015-10-15 ENCOUNTER — Other Ambulatory Visit: Payer: Self-pay | Admitting: Family Medicine

## 2015-10-15 DIAGNOSIS — I10 Essential (primary) hypertension: Secondary | ICD-10-CM

## 2015-10-30 ENCOUNTER — Encounter: Payer: Self-pay | Admitting: *Deleted

## 2015-10-30 NOTE — Progress Notes (Signed)
Patient contacted today and confirms no medication changes since last office visit. He was instructed to go ahead and decrease from a 325 mg aspirin to 81 mg aspirin.   This patient has yet to pick up Miralax prescription but was instructed to do so.   We will proceed with upper and lower endoscopy that is scheduled at [facility] for 11-06-15.   Patient instructed to call the office should he have further questions.

## 2015-11-06 ENCOUNTER — Ambulatory Visit: Payer: 59 | Admitting: Registered Nurse

## 2015-11-06 ENCOUNTER — Encounter
Admission: RE | Disposition: A | Discharge status: Home / Self Care | Payer: Self-pay | Source: Ambulatory Visit | Attending: General Surgery

## 2015-11-06 ENCOUNTER — Ambulatory Visit
Admission: RE | Admit: 2015-11-06 | Discharge: 2015-11-06 | Disposition: A | Discharge status: Home / Self Care | Payer: 59 | Source: Ambulatory Visit | Attending: General Surgery | Admitting: General Surgery

## 2015-11-06 ENCOUNTER — Encounter: Payer: Self-pay | Admitting: *Deleted

## 2015-11-06 DIAGNOSIS — E119 Type 2 diabetes mellitus without complications: Secondary | ICD-10-CM | POA: Diagnosis not present

## 2015-11-06 DIAGNOSIS — E785 Hyperlipidemia, unspecified: Secondary | ICD-10-CM | POA: Diagnosis not present

## 2015-11-06 DIAGNOSIS — F418 Other specified anxiety disorders: Secondary | ICD-10-CM | POA: Diagnosis not present

## 2015-11-06 DIAGNOSIS — K319 Disease of stomach and duodenum, unspecified: Secondary | ICD-10-CM | POA: Diagnosis not present

## 2015-11-06 DIAGNOSIS — Z1211 Encounter for screening for malignant neoplasm of colon: Secondary | ICD-10-CM | POA: Insufficient documentation

## 2015-11-06 DIAGNOSIS — R1013 Epigastric pain: Secondary | ICD-10-CM | POA: Diagnosis not present

## 2015-11-06 DIAGNOSIS — K219 Gastro-esophageal reflux disease without esophagitis: Secondary | ICD-10-CM | POA: Diagnosis not present

## 2015-11-06 DIAGNOSIS — Z7982 Long term (current) use of aspirin: Secondary | ICD-10-CM | POA: Insufficient documentation

## 2015-11-06 DIAGNOSIS — Z79899 Other long term (current) drug therapy: Secondary | ICD-10-CM | POA: Diagnosis not present

## 2015-11-06 DIAGNOSIS — I10 Essential (primary) hypertension: Secondary | ICD-10-CM | POA: Diagnosis not present

## 2015-11-06 DIAGNOSIS — Z7984 Long term (current) use of oral hypoglycemic drugs: Secondary | ICD-10-CM | POA: Diagnosis not present

## 2015-11-06 DIAGNOSIS — Z87891 Personal history of nicotine dependence: Secondary | ICD-10-CM | POA: Diagnosis not present

## 2015-11-06 DIAGNOSIS — I2510 Atherosclerotic heart disease of native coronary artery without angina pectoris: Secondary | ICD-10-CM | POA: Diagnosis not present

## 2015-11-06 DIAGNOSIS — R1010 Upper abdominal pain, unspecified: Secondary | ICD-10-CM

## 2015-11-06 HISTORY — PX: COLONOSCOPY WITH PROPOFOL: SHX5780

## 2015-11-06 HISTORY — PX: ESOPHAGOGASTRODUODENOSCOPY (EGD) WITH PROPOFOL: SHX5813

## 2015-11-06 LAB — GLUCOSE, CAPILLARY: GLUCOSE-CAPILLARY: 285 mg/dL — AB (ref 65–99)

## 2015-11-06 SURGERY — COLONOSCOPY WITH PROPOFOL
Anesthesia: General

## 2015-11-06 MED ORDER — PROPOFOL 500 MG/50ML IV EMUL
INTRAVENOUS | Status: DC | PRN
  Administered 2015-11-06: 160 ug/kg/min via INTRAVENOUS

## 2015-11-06 MED ORDER — LIDOCAINE HCL (CARDIAC) 20 MG/ML IV SOLN
INTRAVENOUS | Status: DC | PRN
  Administered 2015-11-06: 40 mg via INTRAVENOUS

## 2015-11-06 MED ORDER — GLYCOPYRROLATE 0.2 MG/ML IJ SOLN
INTRAMUSCULAR | Status: DC | PRN
  Administered 2015-11-06: 0.2 mg via INTRAVENOUS

## 2015-11-06 MED ORDER — SODIUM CHLORIDE 0.9 % IV SOLN
INTRAVENOUS | Status: DC
  Administered 2015-11-06: 1000 mL via INTRAVENOUS

## 2015-11-06 MED ORDER — MIDAZOLAM HCL 2 MG/2ML IJ SOLN
INTRAMUSCULAR | Status: DC | PRN
  Administered 2015-11-06: 1 mg via INTRAVENOUS

## 2015-11-06 MED ORDER — FENTANYL CITRATE (PF) 100 MCG/2ML IJ SOLN
INTRAMUSCULAR | Status: DC | PRN
  Administered 2015-11-06: 50 ug via INTRAVENOUS

## 2015-11-06 MED ORDER — PROPOFOL 10 MG/ML IV BOLUS
INTRAVENOUS | Status: DC | PRN
Start: 2015-11-06 — End: 2015-11-06
  Administered 2015-11-06: 90 mg via INTRAVENOUS

## 2015-11-06 NOTE — Op Note (Signed)
[facility] Gastroenterology Patient Name: Samuel FerrisStephen Ciliberto Procedure Date: 11/06/2015 10:44 AM MRN: [id number] Account #: [id number] Date of Birth: [birth year] Admit Type: Outpatient Age: 60 [facility] ROOM 1 Gender: Male Note Status: Finalized Procedure:            Colonoscopy Indications:          Screening for colorectal malignant neoplasm Providers:            Earline MayotteJeffrey W. Arcangel Minion, MD Referring MD:         Ferdinand Langoichard L. Sullivan LoneGilbert, MD (Referring MD) Medicines:            Monitored Anesthesia Care Complications:        No immediate complications. Procedure:            Pre-Anesthesia Assessment:                       - Prior to the procedure, a History and Physical was                        performed, and patient medications, allergies and                        sensitivities were reviewed. The patient's tolerance of                        previous anesthesia was reviewed.                       - The risks and benefits of the procedure and the                        sedation options and risks were discussed with the                        patient. All questions were answered and informed                        consent was obtained.                       After obtaining informed consent, the colonoscope was                        passed under direct vision. Throughout the procedure,                        the patient's blood pressure, pulse, and oxygen                        saturations were monitored continuously. The                        Colonoscope was introduced through the anus and                        advanced to the the cecum, identified by appendiceal                        orifice and ileocecal valve. The colonoscopy was  performed without difficulty. The patient tolerated the                        procedure well. The quality of the bowel preparation                        was excellent. Findings:      The entire examined colon  appeared normal on direct and retroflexion       views. Impression:           - The entire examined colon is normal on direct and                        retroflexion views.                       - No specimens collected. Recommendation:       - Return to endoscopist in 2 weeks. Procedure Code(s):    --- Professional ---                       [phone], Colonoscopy, flexible; diagnostic, including                        collection of specimen(s) by brushing or washing, when                        performed (separate procedure) Diagnosis Code(s):    --- Professional ---                       Z12.11, Encounter for screening for malignant neoplasm                        of colon CPT copyright 2016 American Medical Association. All rights reserved. The codes documented in this report are preliminary and upon coder review may  be revised to meet current compliance requirements. Earline Mayotte, MD 11/06/2015 11:18:35 AM This report has been signed electronically. Number of Addenda: 0 Note Initiated On: 11/06/2015 10:44 AM Scope Withdrawal Time: 0 hours 4 minutes 14 seconds  Total Procedure Duration: 0 hours 12 minutes 6 seconds       [facility]

## 2015-11-06 NOTE — H&P (Signed)
ELSTON ALDAPE [id number] [birth year]     HPI: 60 year old male with reports of epigastric pain. Negative CT. 4 upper and lower endoscopy. Patient reports he tolerated the prep well.  Facility-Administered Medications Prior to Admission  Medication Dose Route Frequency Provider Last Rate Last Dose  . [DISCONTINUED] levalbuterol (XOPENEX) nebulizer solution 1.25 mg  1.25 mg Nebulization Once Maple Hudson., MD       Prescriptions Prior to Admission  Medication Sig Dispense Refill Last Dose  . atenolol (TENORMIN) 50 MG tablet TAKE 1 TABLET BY MOUTH EVERY DAY 90 tablet 3 11/05/2015 at 0800  . ALPRAZolam (XANAX) 0.5 MG tablet Take 1 tablet by mouth every 8 (eight) hours as needed.   Taking  . amLODipine (NORVASC) 5 MG tablet TAKE 1 TABLET BY MOUTH DAILY 30 tablet 12   . aspirin 325 MG tablet Take 1 tablet by mouth daily.   Taking  . KAZANO 12.05-998 MG TABS TAKE 1 TABLET BY MOUTH TWICE DAILY 60 tablet 12 Taking  . lisinopril (PRINIVIL,ZESTRIL) 20 MG tablet TAKE 1 TABLET BY MOUTH DAILY. 30 tablet 12 Taking  . MULTIPLE VITAMINS PO Take 1 tablet by mouth daily.   Taking  . nitroGLYCERIN (NITROSTAT) 0.4 MG SL tablet Place 0.4 mg under the tongue every 5 (five) minutes as needed.   Taking  . omeprazole (PRILOSEC) 20 MG capsule Take 1 capsule (20 mg total) by mouth 2 (two) times daily before a meal. 60 capsule 12 Taking  . ranitidine (ZANTAC) 150 MG tablet Take 1 tablet (150 mg total) by mouth at bedtime. 30 tablet 12 Taking  . rosuvastatin (CRESTOR) 40 MG tablet TAKE 1 TABLET(40 MG) BY MOUTH DAILY 30 tablet 12 Taking  . sertraline (ZOLOFT) 50 MG tablet TAKE 1 TABLET BY MOUTH EVERY NIGHT AT BEDTIME. 30 tablet 12 Taking   Allergies  Allergen Reactions  . Prednisone Other (See Comments)    Makes him very mean   Past Medical History:  Diagnosis Date  . Anxiety   . Arthritis   . Coronary atherosclerosis of native coronary artery 11/19/2004   2 stents placed to the LAD in [place], [place].    . DDD (degenerative disc disease), lumbar   . GERD (gastroesophageal reflux disease)   . Other and unspecified hyperlipidemia   . Status post cardiac catheterization 2003   [facility]  . Type II or unspecified type diabetes mellitus without mention of complication, not stated as uncontrolled   . Unspecified essential hypertension   . Wears glasses    Past Surgical History:  Procedure Laterality Date  . ANKLE FUSION  2003   left ankle  . BACK SURGERY  93,97   lumb x2  . CARDIAC CATHETERIZATION  2003  . CORONARY ANGIOPLASTY  2006  . DORSAL COMPARTMENT RELEASE Left 12/29/2013   Procedure: LEFT WRIST RELEASE DORSAL COMPARTMENT (DEQUERVAIN);  Surgeon: Jodi Marble, MD;  Location: [facility];  Service: Orthopedics;  Laterality: Left;  . KNEE SURGERY  1978   rt  . NECK SURGERY  2010   Social History   Social History  . Marital status: Married    Spouse name: N/A  . Number of children: N/A  . Years of education: N/A   Occupational History  . Not on file.   Social History Main Topics  . Smoking status: Former Smoker    Packs/day: 2.00    Years: 8.00    Types: Cigarettes    Quit date: 02/08/1982  . Smokeless tobacco: Never Used  .  Alcohol use No  . Drug use: No  . Sexual activity: Not on file   Other Topics Concern  . Not on file   Social History Narrative  . No narrative on file   Social History   Social History Narrative  . No narrative on file     ROS: Negative.     PE: HEENT: Negative. Lungs: Clear. Cardio: RR.   Assessment/Plan:  Proceed with planned endoscopy.   Earline MayotteByrnett, Elsie Baynes W 11/06/2015

## 2015-11-06 NOTE — Transfer of Care (Signed)
Immediate Anesthesia Transfer of Care Note  Patient: Samuel Gentry  Procedure(s) Performed: Procedure(s): COLONOSCOPY WITH PROPOFOL (N/A) ESOPHAGOGASTRODUODENOSCOPY (EGD) WITH PROPOFOL (N/A)  Patient Location: PACU and Endoscopy Unit  Anesthesia Type:General  Level of Consciousness: sedated  Airway & Oxygen Therapy: Patient Spontanous Breathing and Patient connected to nasal cannula oxygen  Post-op Assessment: Report given to RN and Post -op Vital signs reviewed and stable  Post vital signs: Reviewed and stable  Last Vitals:  Vitals:   11/06/15 1121 11/06/15 1122  BP: 112/82   Pulse: 71   Resp: 17   Temp: 36.1 C 36.3 C    Complications: No apparent anesthesia complications

## 2015-11-06 NOTE — Op Note (Signed)
[facility] Gastroenterology Patient Name: Samuel Gentry Procedure Date: 11/06/2015 10:45 AM MRN: [id number] Account #: [id number] Date of Birth: 11-Aug-1955 Admit Type: Outpatient Age: 60 Room: [facility] ENDO ROOM 1 Gender: Male Note Status: Finalized Procedure:            Upper GI endoscopy Indications:          Epigastric abdominal pain Providers:            Earline Mayotte, MD Referring MD:         Ferdinand Lango. Sullivan Lone, MD (Referring MD) Medicines:            Monitored Anesthesia Care Complications:        No immediate complications. Procedure:            Pre-Anesthesia Assessment:                       - Prior to the procedure, a History and Physical was                        performed, and patient medications, allergies and                        sensitivities were reviewed. The patient's tolerance of                        previous anesthesia was reviewed.                       - The risks and benefits of the procedure and the                        sedation options and risks were discussed with the                        patient. All questions were answered and informed                        consent was obtained.                       After obtaining informed consent, the endoscope was                        passed under direct vision. Throughout the procedure,                        the patient's blood pressure, pulse, and oxygen                        saturations were monitored continuously. The Endoscope                        was introduced through the mouth, and advanced to the                        second part of duodenum. The upper GI endoscopy was                        accomplished without difficulty. The patient tolerated  the procedure well. Findings:      The esophagus was normal.      The entire examined stomach was normal. The antrum was biopsied with a       cold forceps for histology.      The examined duodenum was  normal. Impression:           - Normal esophagus.                       - Normal stomach. Biopsied.                       - Normal examined duodenum. Recommendation:       - Perform a colonoscopy today. Procedure Code(s):    --- Professional ---                       [phone], Esophagogastroduodenoscopy, flexible, transoral;                        with biopsy, single or multiple Diagnosis Code(s):    --- Professional ---                       R10.13, Epigastric pain CPT copyright 2016 American Medical Association. All rights reserved. The codes documented in this report are preliminary and upon coder review may  be revised to meet current compliance requirements. Earline MayotteJeffrey W. Jaiveer Panas, MD 11/06/2015 11:03:21 AM This report has been signed electronically. Number of Addenda: 0 Note Initiated On: 11/06/2015 10:45 AM      [facility]

## 2015-11-06 NOTE — Anesthesia Postprocedure Evaluation (Signed)
Anesthesia Post Note  Patient: Samuel Gentry  Procedure(s) Performed: Procedure(s) (LRB): COLONOSCOPY WITH PROPOFOL (N/A) ESOPHAGOGASTRODUODENOSCOPY (EGD) WITH PROPOFOL (N/A)  Patient location during evaluation: PACU Anesthesia Type: General Level of consciousness: awake Pain management: pain level controlled Vital Signs Assessment: post-procedure vital signs reviewed and stable Respiratory status: spontaneous breathing Cardiovascular status: stable Anesthetic complications: no    Last Vitals:  Vitals:   11/06/15 1122 11/06/15 1151  BP:  (!) 131/91  Pulse:    Resp:    Temp: 36.3 C     Last Pain:  Vitals:   11/06/15 1122  TempSrc: Tympanic                 VAN STAVEREN,Izaiha Lo     

## 2015-11-06 NOTE — Anesthesia Postprocedure Evaluation (Signed)
Anesthesia Post Note  Patient: Jeoffrey MassedStephen W Miceli  Procedure(s) Performed: Procedure(s) (LRB): COLONOSCOPY WITH PROPOFOL (N/A) ESOPHAGOGASTRODUODENOSCOPY (EGD) WITH PROPOFOL (N/A)  Patient location during evaluation: PACU Anesthesia Type: General Level of consciousness: awake Pain management: pain level controlled Vital Signs Assessment: post-procedure vital signs reviewed and stable Respiratory status: spontaneous breathing Cardiovascular status: stable Anesthetic complications: no    Last Vitals:  Vitals:   11/06/15 1122 11/06/15 1151  BP:  (!) 131/91  Pulse:    Resp:    Temp: 36.3 C     Last Pain:  Vitals:   11/06/15 1122  TempSrc: Tympanic                 VAN STAVEREN,Mcadoo Muzquiz

## 2015-11-06 NOTE — Anesthesia Procedure Notes (Signed)
Date/Time: 11/06/2015 10:50 AM Performed by: Stormy FabianURTIS, Chinelo Benn Pre-anesthesia Checklist: Patient identified, Emergency Drugs available, Suction available and Patient being monitored Patient Re-evaluated:Patient Re-evaluated prior to inductionOxygen Delivery Method: Nasal cannula Intubation Type: IV induction Dental Injury: Teeth and Oropharynx as per pre-operative assessment  Comments: Nasal cannula with etCO2 monitoring

## 2015-11-06 NOTE — Anesthesia Preprocedure Evaluation (Signed)
Anesthesia Evaluation  Patient identified by MRN, date of birth, ID band Patient awake    Reviewed: Allergy & Precautions, NPO status , Patient's Chart, lab work & pertinent test results  Airway Mallampati: II       Dental  (+) Teeth Intact   Pulmonary neg pulmonary ROS, former smoker,    breath sounds clear to auscultation       Cardiovascular hypertension, Pt. on medications + CAD and + Cardiac Stents   Rhythm:Regular Rate:Normal     Neuro/Psych Anxiety Depression    GI/Hepatic Neg liver ROS, GERD  ,  Endo/Other  diabetes, Type 2, Oral Hypoglycemic Agents  Renal/GU negative Renal ROS     Musculoskeletal   Abdominal Normal abdominal exam  (+)   Peds  Hematology   Anesthesia Other Findings   Reproductive/Obstetrics                             Anesthesia Physical Anesthesia Plan  ASA: II  Anesthesia Plan: General   Post-op Pain Management:    Induction: Intravenous  Airway Management Planned: Natural Airway and Nasal Cannula  Additional Equipment:   Intra-op Plan:   Post-operative Plan:   Informed Consent: I have reviewed the patients History and Physical, chart, labs and discussed the procedure including the risks, benefits and alternatives for the proposed anesthesia with the patient or authorized representative who has indicated his/her understanding and acceptance.     Plan Discussed with: CRNA  Anesthesia Plan Comments:         Anesthesia Quick Evaluation

## 2015-11-08 LAB — SURGICAL PATHOLOGY

## 2015-11-12 ENCOUNTER — Telehealth: Payer: Self-pay | Admitting: *Deleted

## 2015-11-12 NOTE — Telephone Encounter (Signed)
Patient notified as instructed and verbalized understanding. Patient has follow up appointment with Dr. Sullivan LoneGilbert on 11/25/15.

## 2015-11-12 NOTE — Telephone Encounter (Signed)
-----   Message from Samuel MayotteJeffrey W Byrnett, MD sent at 11/11/2015  3:13 PM EDT ----- Please notify the patient that his stomach biopsies completed last week were entirely normal. No clear source for his abdominal pain. He can follow up here or with Dr. Sullivan LoneGilbert to review further options.

## 2015-11-25 ENCOUNTER — Ambulatory Visit (INDEPENDENT_AMBULATORY_CARE_PROVIDER_SITE_OTHER): Payer: 59 | Admitting: Family Medicine

## 2015-11-25 ENCOUNTER — Encounter: Payer: Self-pay | Admitting: Family Medicine

## 2015-11-25 VITALS — BP 120/72 | HR 80 | Temp 97.7°F | Resp 16 | Wt 224.0 lb

## 2015-11-25 DIAGNOSIS — E119 Type 2 diabetes mellitus without complications: Secondary | ICD-10-CM

## 2015-11-25 DIAGNOSIS — Z23 Encounter for immunization: Secondary | ICD-10-CM

## 2015-11-25 DIAGNOSIS — I10 Essential (primary) hypertension: Secondary | ICD-10-CM

## 2015-11-25 DIAGNOSIS — E291 Testicular hypofunction: Secondary | ICD-10-CM | POA: Diagnosis not present

## 2015-11-25 LAB — POCT GLYCOSYLATED HEMOGLOBIN (HGB A1C)
Est. average glucose Bld gHb Est-mCnc: 235
HEMOGLOBIN A1C: 9.8

## 2015-11-25 MED ORDER — METFORMIN HCL 1000 MG PO TABS: 1000.0000 mg | ORAL_TABLET | Freq: Two times a day (BID) | ORAL | 2 refills | Status: DC

## 2015-11-25 MED ORDER — EMPAGLIFLOZIN 25 MG PO TABS: 25.0000 mg | ORAL_TABLET | Freq: Every day | ORAL | 2 refills | Status: DC

## 2015-11-25 NOTE — Patient Instructions (Signed)
Please discontinue Kazano and start taking Jardiance and metformin.

## 2015-11-25 NOTE — Progress Notes (Signed)
Patient: Samuel Gentry Male    DOB: 03-25-1955   60 y.o.   MRN: [id number] Visit Date: 11/25/2015  Today's Provider: Megan Mansichard Nicolas Banh Jr, MD   Chief Complaint  Patient presents with  . Diabetes  . Hypogonadism  . Hypertension   Subjective:    HPI  Diabetes Mellitus Type II, Follow-up:   Lab Results  Component Value Date   HGBA1C 9.8 11/25/2015   HGBA1C 9.6 (H) 08/21/2015   HGBA1C 7.4 01/16/2015    Last seen for diabetes 3 months ago.  Management since then includes check labs. He reports excellent compliance with treatment. He is not having side effects.  Current symptoms include none and have been stable. Home blood sugar records: not being checked  Episodes of hypoglycemia? no   Current Insulin Regimen:  Most Recent Eye Exam:  Weight trend: stable Prior visit with dietician: no Current diet: in general, a "healthy" diet   Current exercise: cardiovascular workout on exercise equipment and weightlifting  Pertinent Labs:    Component Value Date/Time   CHOL 180 08/21/2015 1032   TRIG 181 (H) 08/21/2015 1032   HDL 36 (L) 08/21/2015 1032   LDLCALC 108 (H) 08/21/2015 1032   CREATININE 1.10 10/09/2015 0849    Wt Readings from Last 3 Encounters:  11/25/15 224 lb (101.6 kg)  11/06/15 220 lb (99.8 kg)  10/02/15 223 lb (101.2 kg)   ------------------------------------------------------------------------   Hypertension, follow-up:  BP Readings from Last 3 Encounters:  11/25/15 120/72  11/06/15 (!) 131/91  10/02/15 122/82    He was last seen for hypertension 3 months ago.  BP at that visit was 131/91. Management changes since that visit include no changes. He reports excellent compliance with treatment. He is not having side effects.  He is exercising. He is adherent to low salt diet.   Outside blood pressures are stable. He is experiencing none.  Patient denies chest pain.   Cardiovascular risk factors include advanced age (older than 2755  for men, 4065 for women), diabetes mellitus and hypertension.  Use of agents associated with hypertension: none.    ------------------------------------------------------------------------   Follow up for low testosterone  The patient was last seen for this 3 months ago. Changes made at last visit include labs checked. Patient advised to diet and exercise.  ------------------------------------------------------------------------------------     Allergies  Allergen Reactions  . Prednisone Other (See Comments)    Makes him very mean     Current Outpatient Prescriptions:  .  ALPRAZolam (XANAX) 0.5 MG tablet, Take 1 tablet by mouth every 8 (eight) hours as needed., Disp: , Rfl:  .  amLODipine (NORVASC) 5 MG tablet, TAKE 1 TABLET BY MOUTH DAILY, Disp: 30 tablet, Rfl: 12 .  aspirin 325 MG tablet, Take 1 tablet by mouth daily., Disp: , Rfl:  .  atenolol (TENORMIN) 50 MG tablet, TAKE 1 TABLET BY MOUTH EVERY DAY, Disp: 90 tablet, Rfl: 3 .  KAZANO 12.05-998 MG TABS, TAKE 1 TABLET BY MOUTH TWICE DAILY, Disp: 60 tablet, Rfl: 12 .  lisinopril (PRINIVIL,ZESTRIL) 20 MG tablet, TAKE 1 TABLET BY MOUTH DAILY., Disp: 30 tablet, Rfl: 12 .  MULTIPLE VITAMINS PO, Take 1 tablet by mouth daily., Disp: , Rfl:  .  nitroGLYCERIN (NITROSTAT) 0.4 MG SL tablet, Place 0.4 mg under the tongue every 5 (five) minutes as needed., Disp: , Rfl:  .  omeprazole (PRILOSEC) 20 MG capsule, Take 1 capsule (20 mg total) by mouth 2 (two) times daily before a  meal., Disp: 60 capsule, Rfl: 12 .  ranitidine (ZANTAC) 150 MG tablet, Take 1 tablet (150 mg total) by mouth at bedtime., Disp: 30 tablet, Rfl: 12 .  rosuvastatin (CRESTOR) 40 MG tablet, TAKE 1 TABLET(40 MG) BY MOUTH DAILY, Disp: 30 tablet, Rfl: 12 .  sertraline (ZOLOFT) 50 MG tablet, TAKE 1 TABLET BY MOUTH EVERY NIGHT AT BEDTIME., Disp: 30 tablet, Rfl: 12  Review of Systems  Constitutional: Positive for fatigue.  HENT: Negative.   Eyes: Negative.   Respiratory:  Negative.   Cardiovascular: Negative.   Endocrine: Negative.   Allergic/Immunologic: Negative.   Neurological: Negative.   Psychiatric/Behavioral: Negative.     Social History  Substance Use Topics  . Smoking status: Former Smoker    Packs/day: 2.00    Years: 8.00    Types: Cigarettes    Quit date: 02/08/1982  . Smokeless tobacco: Never Used  . Alcohol use No   Objective:   BP 120/72 (BP Location: Right Arm, Patient Position: Sitting, Cuff Size: Large)   Pulse 80   Temp 97.7 F (36.5 C) (Oral)   Resp 16   Wt 224 lb (101.6 kg)   BMI 31.24 kg/m   Physical Exam  Constitutional: He is oriented to person, place, and time. He appears well-developed and well-nourished.  HENT:  Head: Normocephalic and atraumatic.  Right Ear: External ear normal.  Left Ear: External ear normal.  Nose: Nose normal.  Eyes: Conjunctivae are normal. No scleral icterus.  Neck: Neck supple. No thyromegaly present.  Cardiovascular: Normal rate, regular rhythm and normal heart sounds.   Pulmonary/Chest: Effort normal and breath sounds normal.  Abdominal: Soft.  Neurological: He is alert and oriented to person, place, and time.  Skin: Skin is warm and dry.  Psychiatric: He has a normal mood and affect. His behavior is normal. Judgment and thought content normal.        Assessment & Plan:     1. Type 2 diabetes mellitus without complication, without long-term current use of insulin (HCC) Not at goal. Patient advised to D/C Kazano and start Jardiance and metformin as below and FU in 3 months. - POCT glycosylated hemoglobin (Hb A1C) - empagliflozin (JARDIANCE) 25 MG TABS tablet; Take 25 mg by mouth daily.  Dispense: 30 tablet; Refill: 2 - metFORMIN (GLUCOPHAGE) 1000 MG tablet; Take 1 tablet (1,000 mg total) by mouth 2 (two) times daily with a meal.  Dispense: 60 tablet; Refill: 2  2. Essential hypertension Stable. Patient advised to continue current medication and plan of care.  3.  Hypogonadism FU in 3 months will check testosterone and prolactin level at next ov.  4. Need for influenza vaccine Flu vaccine given today. 5. CAD All risk factors treated and Jardiance. Positive option and lowered his cardiovascular risks. 6. Fatigue Discussed regular exercise for overall health. Early hypogonadism could contribute.      I have done the exam and reviewed the chart and it is accurate to the best of my knowledge. DentistDragon  technology has been used and  any errors in dictation or transcription are unintentional. Julieanne Mansonichard Danett Palazzo M.D. [facility]   Megan Mansichard Samuel Fontenot Jr, MD  [facility]

## 2015-12-16 ENCOUNTER — Other Ambulatory Visit: Payer: Self-pay | Admitting: Family Medicine

## 2015-12-31 ENCOUNTER — Other Ambulatory Visit: Payer: Self-pay | Admitting: Family Medicine

## 2016-02-16 ENCOUNTER — Other Ambulatory Visit: Payer: Self-pay | Admitting: Family Medicine

## 2016-02-16 DIAGNOSIS — E119 Type 2 diabetes mellitus without complications: Secondary | ICD-10-CM

## 2016-02-17 ENCOUNTER — Other Ambulatory Visit: Payer: Self-pay | Admitting: Family Medicine

## 2016-02-17 DIAGNOSIS — E119 Type 2 diabetes mellitus without complications: Secondary | ICD-10-CM

## 2016-03-03 ENCOUNTER — Encounter: Payer: Self-pay | Admitting: Family Medicine

## 2016-03-03 ENCOUNTER — Ambulatory Visit (INDEPENDENT_AMBULATORY_CARE_PROVIDER_SITE_OTHER): Payer: 59 | Admitting: Family Medicine

## 2016-03-03 VITALS — BP 110/64 | HR 68 | Temp 97.5°F | Resp 16 | Wt 218.0 lb

## 2016-03-03 DIAGNOSIS — I10 Essential (primary) hypertension: Secondary | ICD-10-CM

## 2016-03-03 DIAGNOSIS — E291 Testicular hypofunction: Secondary | ICD-10-CM

## 2016-03-03 DIAGNOSIS — Z1329 Encounter for screening for other suspected endocrine disorder: Secondary | ICD-10-CM

## 2016-03-03 DIAGNOSIS — E7849 Other hyperlipidemia: Secondary | ICD-10-CM

## 2016-03-03 DIAGNOSIS — E784 Other hyperlipidemia: Secondary | ICD-10-CM

## 2016-03-03 DIAGNOSIS — E119 Type 2 diabetes mellitus without complications: Secondary | ICD-10-CM | POA: Diagnosis not present

## 2016-03-03 DIAGNOSIS — K219 Gastro-esophageal reflux disease without esophagitis: Secondary | ICD-10-CM | POA: Diagnosis not present

## 2016-03-03 LAB — POCT GLYCOSYLATED HEMOGLOBIN (HGB A1C)
ESTIMATED AVERAGE GLUCOSE: 237
Hemoglobin A1C: 9.9

## 2016-03-03 NOTE — Progress Notes (Signed)
Subjective:  HPI  Diabetes Mellitus Type II, Follow-up:   Lab Results  Component Value Date   HGBA1C 9.8 11/25/2015   HGBA1C 9.6 (H) 08/21/2015   HGBA1C 7.4 01/16/2015    Last seen for diabetes 3 months ago.  Management since then includes d/c Kazano and start jardiance. He reports good compliance with treatment. He is not having side effects.  Home blood sugar records: not being checked  Episodes of hypoglycemia? no   Current Insulin Regimen: n/a Most Recent Eye Exam: 1 year ago Current exercise: 3-4 days a week at the gym  Pertinent Labs:    Component Value Date/Time   CHOL 180 08/21/2015 1032   TRIG 181 (H) 08/21/2015 1032   HDL 36 (L) 08/21/2015 1032   LDLCALC 108 (H) 08/21/2015 1032   CREATININE 1.10 10/09/2015 0849    Wt Readings from Last 3 Encounters:  03/03/16 218 lb (98.9 kg)  11/25/15 224 lb (101.6 kg)  11/06/15 220 lb (99.8 kg)    ------------------------------------------------------------------------ Hypogonadism- per last OV note check Testosterone and prolactin next OV.   Prior to Admission medications   Medication Sig Start Date End Date Taking? Authorizing Provider  ALPRAZolam Prudy Feeler) 0.5 MG tablet Take 1 tablet by mouth every 8 (eight) hours as needed. 10/04/13   Historical Provider, MD  amLODipine (NORVASC) 5 MG tablet TAKE 1 TABLET BY MOUTH DAILY 10/16/15   Maple Hudson., MD  aspirin 325 MG tablet Take 1 tablet by mouth daily. 01/02/10   Historical Provider, MD  atenolol (TENORMIN) 50 MG tablet TAKE 1 TABLET BY MOUTH EVERY DAY 05/13/15   Maple Hudson., MD  JARDIANCE 25 MG TABS tablet TAKE 1 TABLET BY MOUTH DAILY 02/18/16   Maple Hudson., MD  lisinopril (PRINIVIL,ZESTRIL) 20 MG tablet TAKE 1 TABLET BY MOUTH DAILY. 12/31/15   Ginna Schuur Hulen Shouts., MD  metFORMIN (GLUCOPHAGE) 1000 MG tablet TAKE 1 TABLET(1000 MG) BY MOUTH TWICE DAILY WITH A MEAL 02/17/16   Maple Hudson., MD  MULTIPLE VITAMINS PO Take 1 tablet by  mouth daily. 01/02/10   Historical Provider, MD  nitroGLYCERIN (NITROSTAT) 0.4 MG SL tablet Place 0.4 mg under the tongue every 5 (five) minutes as needed.    Historical Provider, MD  omeprazole (PRILOSEC) 20 MG capsule Take 1 capsule (20 mg total) by mouth 2 (two) times daily before a meal. 01/16/15   Maple Hudson., MD  ranitidine (ZANTAC) 150 MG tablet Take 1 tablet (150 mg total) by mouth at bedtime. 08/21/15   Nemesis Rainwater Hulen Shouts., MD  rosuvastatin (CRESTOR) 40 MG tablet TAKE 1 TABLET(40 MG) BY MOUTH DAILY 09/23/15   Maple Hudson., MD  sertraline (ZOLOFT) 50 MG tablet TAKE 1 TABLET BY MOUTH EVERY NIGHT AT BEDTIME. 12/16/15   Maple Hudson., MD    Patient Active Problem List   Diagnosis Date Noted  . Pain of upper abdomen 10/02/2015  . Diastasis recti 10/02/2015  . Foot pain, right 07/25/2014  . AA (alopecia areata) 04/25/2014  . Angina of effort (HCC) 04/25/2014  . Arthritis 04/25/2014  . CAD in native artery 04/25/2014  . History of chicken pox 04/25/2014  . Abrasion of cornea 04/25/2014  . Depression, major, single episode, mild (HCC) 04/25/2014  . HLD (hyperlipidemia) 04/25/2014  . BP (high blood pressure) 04/25/2014  . Chest pain 02/09/2012  . CAD (coronary artery disease) 02/09/2012  . S/P coronary artery stent placement 02/09/2012  . Diabetes mellitus (  HCC) 02/09/2012  . Hyperlipidemia 02/09/2012    Past Medical History:  Diagnosis Date  . Anxiety   . Arthritis   . Coronary atherosclerosis of native coronary artery 11/19/2004   [place]   . DDD (degenerative disc disease), lumbar   . GERD (gastroesophageal reflux disease)   . Other and unspecified hyperlipidemia   . Status post cardiac catheterization 2003   [facility]  . Type II or unspecified type diabetes mellitus without mention of complication, not stated as uncontrolled   . Unspecified essential hypertension   . Wears glasses     Social History   Social  History  . Marital status: Married    Spouse name: N/A  . Number of children: N/A  . Years of education: N/A   Occupational History  . Not on file.   Social History Main Topics  . Smoking status: Former Smoker    Packs/day: 2.00    Years: 8.00    Types: Cigarettes    Quit date: 02/08/1982  . Smokeless tobacco: Never Used  . Alcohol use No  . Drug use: No  . Sexual activity: Not on file   Other Topics Concern  . Not on file   Social History Narrative  . No narrative on file    Allergies  Allergen Reactions  . Prednisone Other (See Comments)    Makes him very mean    Review of Systems  Constitutional: Negative.   HENT: Negative.   Eyes: Negative.   Respiratory: Negative.   Cardiovascular: Negative.   Gastrointestinal: Negative.   Genitourinary: Negative.   Musculoskeletal: Positive for joint pain (tendonitis in right wrist, see ortho).  Skin: Negative.   Neurological: Negative.   Endo/Heme/Allergies: Negative.   Psychiatric/Behavioral: Negative.     Immunization History  Administered Date(s) Administered  . Influenza,inj,Quad PF,36+ Mos 11/25/2015  . Tdap 01/02/2010    Objective:  BP 110/64 (BP Location: Left Arm, Patient Position: Sitting, Cuff Size: Large)   Pulse 68   Temp 97.5 F (36.4 C) (Oral)   Resp 16   Wt 218 lb (98.9 kg)   SpO2 94%   BMI 30.40 kg/m   Physical Exam  Lab Results  Component Value Date   WBC 5.9 08/21/2015   HGB 14.9 12/29/2013   HCT 42.7 08/21/2015   PLT 213 08/21/2015   GLUCOSE 227 (H) 08/21/2015   CHOL 180 08/21/2015   TRIG 181 (H) 08/21/2015   HDL 36 (L) 08/21/2015   LDLCALC 108 (H) 08/21/2015   TSH 2.360 08/21/2015   HGBA1C 9.8 11/25/2015   MICROALBUR 50 08/21/2015    CMP     Component Value Date/Time   NA 134 08/21/2015 1032   K 5.4 (H) 08/21/2015 1032   CL 94 (L) 08/21/2015 1032   CO2 21 08/21/2015 1032   GLUCOSE 227 (H) 08/21/2015 1032   GLUCOSE 124 (H) 12/27/2013 1530   BUN 25 (H) 08/21/2015 1032     CREATININE 1.10 10/09/2015 0849   CALCIUM 10.4 (H) 08/21/2015 1032   PROT 7.7 08/21/2015 1032   ALBUMIN 4.9 08/21/2015 1032   AST 41 (H) 08/21/2015 1032   ALT 52 (H) 08/21/2015 1032   ALKPHOS 67 08/21/2015 1032   BILITOT 0.4 08/21/2015 1032   GFRNONAA 63 08/21/2015 1032   GFRAA 72 08/21/2015 1032    Assessment and Plan :  1. Type 2 diabetes mellitus without complication, without long-term current use of insulin (HCC) May need insulin  To get  this down. She does need to lose weight. Of note also his wife just changed jobs and does not have insurance. This might be an issue going forward for his medicine. - POCT HgB A1C--9.9  2. Essential hypertension  - CBC with Differential/Platelet - Comprehensive metabolic panel  3. Hypogonadism male   4. Gastroesophageal reflux disease, esophagitis presence not specified   5. Other hyperlipidemia  - Comprehensive metabolic panel - Lipid panel  6. Screening for hypothyroidism  - TSH I have done the exam and reviewed the chart and it is accurate to the best of my knowledge. Dentist has been used and  any errors in dictation or transcription are unintentional. Julieanne Manson M.D. [facility]     Julieanne Manson MD [facility] 03/03/2016 4:26 PM

## 2016-03-07 LAB — LIPID PANEL
CHOL/HDL RATIO: 5.7 ratio — AB (ref 0.0–5.0)
Cholesterol, Total: 189 mg/dL (ref 100–199)
HDL: 33 mg/dL — AB (ref >39)
LDL CALC: 106 mg/dL — AB (ref 0–99)
TRIGLYCERIDES: 249 mg/dL — AB (ref 0–149)
VLDL Cholesterol Cal: 50 mg/dL — ABNORMAL HIGH (ref 5–40)

## 2016-03-07 LAB — COMPREHENSIVE METABOLIC PANEL
A/G RATIO: 1.8 (ref 1.2–2.2)
ALBUMIN: 4.8 g/dL (ref 3.6–4.8)
ALK PHOS: 73 IU/L (ref 39–117)
ALT: 32 IU/L (ref 0–44)
AST: 26 IU/L (ref 0–40)
BILIRUBIN TOTAL: 0.3 mg/dL (ref 0.0–1.2)
BUN / CREAT RATIO: 20 (ref 10–24)
BUN: 24 mg/dL (ref 8–27)
CO2: 24 mmol/L (ref 18–29)
Calcium: 9.7 mg/dL (ref 8.6–10.2)
Chloride: 97 mmol/L (ref 96–106)
Creatinine, Ser: 1.18 mg/dL (ref 0.76–1.27)
GFR calc non Af Amer: 67 mL/min/{1.73_m2} (ref >59)
GFR, EST AFRICAN AMERICAN: 77 mL/min/{1.73_m2} (ref >59)
GLUCOSE: 249 mg/dL — AB (ref 65–99)
Globulin, Total: 2.6 g/dL (ref 1.5–4.5)
Potassium: 4.9 mmol/L (ref 3.5–5.2)
SODIUM: 135 mmol/L (ref 134–144)
Total Protein: 7.4 g/dL (ref 6.0–8.5)

## 2016-03-07 LAB — CBC WITH DIFFERENTIAL/PLATELET
BASOS ABS: 0 10*3/uL (ref 0.0–0.2)
BASOS: 0 %
EOS (ABSOLUTE): 0.2 10*3/uL (ref 0.0–0.4)
Eos: 3 %
HEMOGLOBIN: 14.7 g/dL (ref 13.0–17.7)
Hematocrit: 44.3 % (ref 37.5–51.0)
Immature Grans (Abs): 0.1 10*3/uL (ref 0.0–0.1)
Immature Granulocytes: 1 %
LYMPHS ABS: 2.2 10*3/uL (ref 0.7–3.1)
Lymphs: 37 %
MCH: 28.5 pg (ref 26.6–33.0)
MCHC: 33.2 g/dL (ref 31.5–35.7)
MCV: 86 fL (ref 79–97)
Monocytes Absolute: 0.5 10*3/uL (ref 0.1–0.9)
Monocytes: 8 %
NEUTROS ABS: 3.1 10*3/uL (ref 1.4–7.0)
Neutrophils: 51 %
PLATELETS: 203 10*3/uL (ref 150–379)
RBC: 5.16 x10E6/uL (ref 4.14–5.80)
RDW: 14.4 % (ref 12.3–15.4)
WBC: 6 10*3/uL (ref 3.4–10.8)

## 2016-03-07 LAB — TSH: TSH: 2.86 u[IU]/mL (ref 0.450–4.500)

## 2016-03-09 ENCOUNTER — Telehealth: Payer: Self-pay

## 2016-03-09 NOTE — Telephone Encounter (Signed)
thanks

## 2016-03-09 NOTE — Telephone Encounter (Signed)
-----   Message from Maple Hudsonichard L Gilbert Jr., MD sent at 03/09/2016 12:36 PM EST ----- Stable labs. Is pt taking Crestor daily.?

## 2016-03-09 NOTE — Telephone Encounter (Signed)
Pt advised. States he is taking Crestor daily. Allene DillonEmily Drozdowski, CMA

## 2016-04-06 ENCOUNTER — Telehealth: Payer: Self-pay | Admitting: Family Medicine

## 2016-04-06 NOTE — Telephone Encounter (Signed)
Spoke with patient to verify which medication he was asking about, Jardiance. We have 1 sample left and patient will pick this up and also left a coupon card that may be helpful until his insurance starts. This was all ok per Dortha Kernennis Chrismon, PA_aa

## 2016-04-06 NOTE — Telephone Encounter (Signed)
Pt has question regarding the diabetic medication he is on.  He has no insurance and the out of pocket price is too expensive,  He will have insurance in 2 weeks.    Do you want him to take his old medication he has a home left over or do we have samples of the new rx.  Please advise,  Thanks Samuel Gentry

## 2016-05-06 ENCOUNTER — Other Ambulatory Visit: Payer: Self-pay | Admitting: Family Medicine

## 2016-05-06 MED ORDER — ATENOLOL 50 MG PO TABS: 50.0000 mg | ORAL_TABLET | Freq: Every day | ORAL | 3 refills | Status: DC

## 2016-05-06 NOTE — Telephone Encounter (Signed)
[facility] faxed a request on the following medication. Thanks CC   atenolol (TENORMIN) 50 MG tablet  Take 1 tablet by mouth every day.

## 2016-05-06 NOTE — Telephone Encounter (Signed)
Refilled-aa 

## 2016-06-10 ENCOUNTER — Telehealth: Payer: Self-pay

## 2016-06-10 NOTE — Telephone Encounter (Signed)
Patient's wife called saying the patient's BS has been around 400s for the last 3 days. She reports that he states that he does not feel well. He feels really fatigued. He denies any blurred vision, shortness of breath, nausea, or headache.   Per Dr. Sullivan LoneGilbert, patient will need to start Tresiba 5 units daily. Sample left in the refrigerator for the patient.   Patient is to increase dose by 2 units if the BS run above 200 every 3rd day that BS is checked. Once BS are below 200, stay at that dose.   Wife was advised and she will pick up sample from the office. Patient will need to F/U in mid June for a recheck.

## 2016-06-15 ENCOUNTER — Other Ambulatory Visit: Payer: Self-pay | Admitting: Family Medicine

## 2016-06-16 ENCOUNTER — Telehealth: Payer: Self-pay | Admitting: Family Medicine

## 2016-06-16 NOTE — Telephone Encounter (Signed)
Error/MW °

## 2016-06-17 ENCOUNTER — Telehealth: Payer: Self-pay | Admitting: Family Medicine

## 2016-06-17 ENCOUNTER — Other Ambulatory Visit: Payer: Self-pay | Admitting: Family Medicine

## 2016-06-17 DIAGNOSIS — E119 Type 2 diabetes mellitus without complications: Secondary | ICD-10-CM

## 2016-06-17 MED ORDER — GLUCOSE BLOOD VI STRP: ORAL_STRIP | 12 refills | Status: DC

## 2016-06-17 NOTE — Telephone Encounter (Signed)
Pt's wife Corrie Dandy(Mary) goes by Happy called requesting that an order for Contour Next test strips be sent to [facility]. Happy was advised that Dr. Sullivan LoneGilbert is out of the office this week and is requesting another provider send in the Rx. Please advise. Thanks TNP

## 2016-06-17 NOTE — Telephone Encounter (Signed)
Sent in

## 2016-06-17 NOTE — Telephone Encounter (Signed)
Please Review

## 2016-07-01 ENCOUNTER — Ambulatory Visit (INDEPENDENT_AMBULATORY_CARE_PROVIDER_SITE_OTHER): Payer: BLUE CROSS/BLUE SHIELD | Admitting: Family Medicine

## 2016-07-01 ENCOUNTER — Encounter: Payer: Self-pay | Admitting: Family Medicine

## 2016-07-01 VITALS — BP 112/60 | HR 64 | Temp 97.6°F | Resp 16 | Wt 218.0 lb

## 2016-07-01 DIAGNOSIS — E119 Type 2 diabetes mellitus without complications: Secondary | ICD-10-CM

## 2016-07-01 DIAGNOSIS — M79641 Pain in right hand: Secondary | ICD-10-CM

## 2016-07-01 DIAGNOSIS — M79642 Pain in left hand: Secondary | ICD-10-CM | POA: Diagnosis not present

## 2016-07-01 LAB — POCT GLYCOSYLATED HEMOGLOBIN (HGB A1C): Hemoglobin A1C: 9.3

## 2016-07-01 NOTE — Progress Notes (Signed)
Subjective:  HPI  Diabetes Mellitus Type II, Follow-up:   Lab Results  Component Value Date   HGBA1C 9.3 07/01/2016   HGBA1C 9.9 03/03/2016   HGBA1C 9.8 11/25/2015    Last seen for diabetes 4 months ago.  Management since then includes started Tresiba 5 units daily then increase by 2 until under 200 consistently. He reports good compliance with treatment. He is not having side effects.  Home blood sugar records: lowest has been 185  Episodes of hypoglycemia? no   Current Insulin Regimen: 9 units daily Current exercise: gym 3-4 times a week  Pertinent Labs:    Component Value Date/Time   CHOL 189 03/06/2016 0817   TRIG 249 (H) 03/06/2016 0817   HDL 33 (L) 03/06/2016 0817   LDLCALC 106 (H) 03/06/2016 0817   CREATININE 1.18 03/06/2016 0817    Wt Readings from Last 3 Encounters:  07/01/16 218 lb (98.9 kg)  03/03/16 218 lb (98.9 kg)  11/25/15 224 lb (101.6 kg)   He is feeling much better on Tresiba than he was at last office visit.  ------------------------------------------------------------------------ Pt reports that he is still having pain in his bilateral wrist and elbow sometimes in his shoulders. He reports that he had surgery on his right wrist and the surgeon thought that was the problem but it has not gotten better from the surgery. He says it happens about once a week and is like a cycle he can feel it coming and then he wakes up with swollen fingers, wrist and palms of hands. Pt is taking Crestor every day. He has been tested for RA and tested neg.    Prior to Admission medications   Medication Sig Start Date End Date Taking? Authorizing Provider  ALPRAZolam Duanne Moron) 0.5 MG tablet Take 1 tablet by mouth every 8 (eight) hours as needed. 10/04/13   [provider]  amLODipine (NORVASC) 5 MG tablet TAKE 1 TABLET BY MOUTH DAILY 10/16/15   Jerrol Banana., MD  aspirin 325 MG tablet Take 1 tablet by mouth daily. 01/02/10   [provider]    atenolol (TENORMIN) 50 MG tablet Take 1 tablet (50 mg total) by mouth daily. 05/06/16   Jerrol Banana., MD  Blood Glucose Monitoring Suppl (CONTOUR NEXT MONITOR) w/Device KIT USE AS DIRECTED 06/22/16   Jerrol Banana., MD  glucose blood (CONTOUR NEXT TEST) test strip To check blood sugar daily 06/17/16   Mar Daring, PA-C  JARDIANCE 25 MG TABS tablet TAKE 1 TABLET BY MOUTH DAILY 02/18/16   Jerrol Banana., MD  lisinopril (PRINIVIL,ZESTRIL) 20 MG tablet TAKE 1 TABLET BY MOUTH DAILY. 12/31/15   Jerrol Banana., MD  metFORMIN (GLUCOPHAGE) 1000 MG tablet TAKE 1 TABLET(1000 MG) BY MOUTH TWICE DAILY WITH A MEAL 02/17/16   Jerrol Banana., MD  MICROLET LANCETS MISC USE AS DIRECTED ONCE DAILY 06/22/16   Jerrol Banana., MD  MULTIPLE VITAMINS PO Take 1 tablet by mouth daily. 01/02/10   [provider]  nitroGLYCERIN (NITROSTAT) 0.4 MG SL tablet Place 0.4 mg under the tongue every 5 (five) minutes as needed.    [provider]  omeprazole (PRILOSEC) 20 MG capsule Take 1 capsule (20 mg total) by mouth 2 (two) times daily before a meal. 01/16/15   Jerrol Banana., MD  ranitidine (ZANTAC) 150 MG tablet Take 1 tablet (150 mg total) by mouth at bedtime. Patient not taking: Reported on 03/03/2016 08/21/15  Jerrol Banana., MD  rosuvastatin (CRESTOR) 40 MG tablet TAKE 1 TABLET(40 MG) BY MOUTH DAILY 09/23/15   Jerrol Banana., MD  sertraline (ZOLOFT) 50 MG tablet TAKE 1 TABLET BY MOUTH EVERY NIGHT AT BEDTIME. 12/16/15   Jerrol Banana., MD    Patient Active Problem List   Diagnosis Date Noted  . Pain of upper abdomen 10/02/2015  . Diastasis recti 10/02/2015  . Foot pain, right 07/25/2014  . AA (alopecia areata) 04/25/2014  . Angina of effort ([place]) 04/25/2014  . Arthritis 04/25/2014  . CAD in native artery 04/25/2014  . History of chicken pox 04/25/2014  . Abrasion of cornea 04/25/2014  . Depression, major, single episode,  mild ([place]) 04/25/2014  . HLD (hyperlipidemia) 04/25/2014  . BP (high blood pressure) 04/25/2014  . Chest pain 02/09/2012  . CAD (coronary artery disease) 02/09/2012  . S/P coronary artery stent placement 02/09/2012  . Diabetes mellitus ([place]) 02/09/2012  . Hyperlipidemia 02/09/2012    Past Medical History:  Diagnosis Date  . Anxiety   . Arthritis   . Coronary atherosclerosis of native coronary artery 11/19/2004   2 stents placed to the LAD in [place], [place].   . DDD (degenerative disc disease), lumbar   . GERD (gastroesophageal reflux disease)   . Other and unspecified hyperlipidemia   . Status post cardiac catheterization 2003   [facility]  . Type II or unspecified type diabetes mellitus without mention of complication, not stated as uncontrolled   . Unspecified essential hypertension   . Wears glasses     Social History   Social History  . Marital status: Married    Spouse name: N/A  . Number of children: N/A  . Years of education: N/A   Occupational History  . Not on file.   Social History Main Topics  . Smoking status: Former Smoker    Packs/day: 2.00    Years: 8.00    Types: Cigarettes    Quit date: 02/08/1982  . Smokeless tobacco: Never Used  . Alcohol use No  . Drug use: No  . Sexual activity: Not on file   Other Topics Concern  . Not on file   Social History Narrative  . No narrative on file    Allergies  Allergen Reactions  . Prednisone Other (See Comments)    Makes him very mean    Review of Systems  Constitutional: Negative.   HENT: Negative.   Eyes: Negative.   Respiratory: Negative.   Cardiovascular: Negative.   Gastrointestinal: Negative.   Genitourinary: Negative.   Musculoskeletal: Positive for joint pain.  Skin: Negative.   Neurological: Negative.   Endo/Heme/Allergies: Negative.   Psychiatric/Behavioral: Negative.     Immunization History  Administered Date(s) Administered  . Influenza,inj,Quad PF,36+ Mos 11/25/2015  . Tdap  01/02/2010    Objective:  BP 112/60 (BP Location: Left Arm, Patient Position: Sitting, Cuff Size: Large)   Pulse 64   Temp 97.6 F (36.4 C) (Oral)   Resp 16   Wt 218 lb (98.9 kg)   BMI 30.40 kg/m   Physical Exam  Constitutional: He is oriented to person, place, and time and well-developed, well-nourished, and in no distress.  Eyes: Conjunctivae and EOM are normal. Pupils are equal, round, and reactive to light.  Neck: Normal range of motion. Neck supple.  Cardiovascular: Normal rate, regular rhythm, normal heart sounds and intact distal pulses.   Pulmonary/Chest: Effort normal and breath sounds normal.  Musculoskeletal: Normal range of motion. He exhibits  tenderness.  Mild diffuse swelling of right wrist.Mild decreased ROM.  Neurological: He is alert and oriented to person, place, and time. He has normal reflexes. Gait normal. GCS score is 15.  Skin: Skin is warm and dry.  Psychiatric: Mood, memory, affect and judgment normal.    Lab Results  Component Value Date   WBC 6.0 03/06/2016   HGB 14.7 03/06/2016   HCT 44.3 03/06/2016   PLT 203 03/06/2016   GLUCOSE 249 (H) 03/06/2016   CHOL 189 03/06/2016   TRIG 249 (H) 03/06/2016   HDL 33 (L) 03/06/2016   LDLCALC 106 (H) 03/06/2016   TSH 2.860 03/06/2016   HGBA1C 9.3 07/01/2016   MICROALBUR 50 08/21/2015    CMP     Component Value Date/Time   NA 135 03/06/2016 0817   K 4.9 03/06/2016 0817   CL 97 03/06/2016 0817   CO2 24 03/06/2016 0817   GLUCOSE 249 (H) 03/06/2016 0817   GLUCOSE 124 (H) 12/27/2013 1530   BUN 24 03/06/2016 0817   CREATININE 1.18 03/06/2016 0817   CALCIUM 9.7 03/06/2016 0817   PROT 7.4 03/06/2016 0817   ALBUMIN 4.8 03/06/2016 0817   AST 26 03/06/2016 0817   ALT 32 03/06/2016 0817   ALKPHOS 73 03/06/2016 0817   BILITOT 0.3 03/06/2016 0817   GFRNONAA 67 03/06/2016 0817   GFRAA 77 03/06/2016 0817    Assessment and Plan :  1. Type 2 diabetes mellitus without complication, without long-term current  use of insulin (HCC)  Increase insulin to 12 units daily. Fasting above 160 every 3 days go up 2 units.  - POCT HgB A1C 9.3   2. Bilateral hand pain Pt has swelling and pain about once a week. Stop Crestor until July 4th. If gets better do not start back but start back if does not improve. Possible Negative RF Rheumatoid Arthrititis. - Ambulatory referral to Rheumatology - DG Hand Complete Right; Future - DG Hand Complete Left; Future 3.CAD   HPI, Exam, and A&P Transcribed under the direction and in the presence of Richard L. Cranford Mon, MD  Electronically Signed: Katina Dung, [place] MD [facility] 07/01/2016 4:54 PM

## 2016-07-01 NOTE — Patient Instructions (Addendum)
Stop Crestor until July 4th. Increase insulin to 12 units daily. Fasting above 160 every 3 days go up 2 units. Then back off if you start having low blood sugars.

## 2016-07-08 ENCOUNTER — Other Ambulatory Visit: Payer: Self-pay

## 2016-07-08 DIAGNOSIS — M1990 Unspecified osteoarthritis, unspecified site: Secondary | ICD-10-CM | POA: Insufficient documentation

## 2016-07-08 MED ORDER — INSULIN DEGLUDEC 100 UNIT/ML ~~LOC~~ SOPN: 14.0000 [IU] | PEN_INJECTOR | Freq: Every day | SUBCUTANEOUS | 12 refills | Status: DC

## 2016-08-06 DIAGNOSIS — M0600 Rheumatoid arthritis without rheumatoid factor, unspecified site: Secondary | ICD-10-CM | POA: Insufficient documentation

## 2016-08-17 ENCOUNTER — Other Ambulatory Visit: Payer: Self-pay | Admitting: Family Medicine

## 2016-10-07 ENCOUNTER — Encounter: Payer: Self-pay | Admitting: Family Medicine

## 2016-10-07 ENCOUNTER — Ambulatory Visit (INDEPENDENT_AMBULATORY_CARE_PROVIDER_SITE_OTHER): Payer: BLUE CROSS/BLUE SHIELD | Admitting: Family Medicine

## 2016-10-07 VITALS — BP 104/72 | HR 68 | Temp 97.8°F | Resp 16 | Ht 70.0 in | Wt 212.0 lb

## 2016-10-07 DIAGNOSIS — Z1211 Encounter for screening for malignant neoplasm of colon: Secondary | ICD-10-CM

## 2016-10-07 DIAGNOSIS — E784 Other hyperlipidemia: Secondary | ICD-10-CM | POA: Diagnosis not present

## 2016-10-07 DIAGNOSIS — Z6830 Body mass index (BMI) 30.0-30.9, adult: Secondary | ICD-10-CM

## 2016-10-07 DIAGNOSIS — Z0000 Encounter for general adult medical examination without abnormal findings: Secondary | ICD-10-CM

## 2016-10-07 DIAGNOSIS — E119 Type 2 diabetes mellitus without complications: Secondary | ICD-10-CM | POA: Diagnosis not present

## 2016-10-07 DIAGNOSIS — E7849 Other hyperlipidemia: Secondary | ICD-10-CM

## 2016-10-07 DIAGNOSIS — Z23 Encounter for immunization: Secondary | ICD-10-CM | POA: Diagnosis not present

## 2016-10-07 DIAGNOSIS — Z125 Encounter for screening for malignant neoplasm of prostate: Secondary | ICD-10-CM

## 2016-10-07 DIAGNOSIS — M0600 Rheumatoid arthritis without rheumatoid factor, unspecified site: Secondary | ICD-10-CM

## 2016-10-07 LAB — POCT UA - MICROALBUMIN: MICROALBUMIN (UR) POC: 50 mg/L

## 2016-10-07 LAB — IFOBT (OCCULT BLOOD): IMMUNOLOGICAL FECAL OCCULT BLOOD TEST: NEGATIVE

## 2016-10-07 MED ORDER — INSULIN DEGLUDEC 100 UNIT/ML ~~LOC~~ SOPN: PEN_INJECTOR | SUBCUTANEOUS | 12 refills | Status: DC

## 2016-10-07 NOTE — Patient Instructions (Signed)
Please schedule your diabetic eye exam and make sure they send [place] a copy please.

## 2016-10-07 NOTE — Progress Notes (Signed)
Patient: Samuel Gentry, Male    DOB: [birth year], 61 y.o.   MRN: [id number] Visit Date: 10/07/2016  Today's Provider: [name], MD   Chief Complaint  Patient presents with  . Annual Exam   Subjective:  Samuel Gentry is a 61 y.o. male who presents today for health maintenance and complete physical. He feels not well at this time due to recent diagnoses of RA and dealing with pain that is not getting better with Methotrexate treatment. He reports exercising not at this time. He reports he is sleeping well.  Routine lab work was done on 03/06/16 Last colonoscopy was 11/08/15 entire colon was normal. Dr Bary Castilla. Review of Systems  Constitutional: Positive for fatigue.  HENT: Negative.   Eyes: Negative.   Respiratory: Negative.   Cardiovascular: Negative.   Gastrointestinal: Negative.   Endocrine: Negative.   Genitourinary: Negative.   Musculoskeletal: Positive for arthralgias, gait problem and joint swelling.  Skin: Negative.   Allergic/Immunologic: Negative.   Hematological: Negative.   Psychiatric/Behavioral: Negative.     Social History   Social History  . Marital status: Married    Spouse name: N/A  . Number of children: N/A  . Years of education: N/A   Occupational History  . Not on file.   Social History Main Topics  . Smoking status: Former Smoker    Packs/day: 2.00    Years: 8.00    Types: Cigarettes    Quit date: 02/08/1982  . Smokeless tobacco: Never Used  . Alcohol use No  . Drug use: No  . Sexual activity: Not on file   Other Topics Concern  . Not on file   Social History Narrative  . No narrative on file    Patient Active Problem List   Diagnosis Date Noted  . Seronegative rheumatoid arthritis ([place]) 08/06/2016  . Pain of upper abdomen 10/02/2015  . Diastasis recti 10/02/2015  . Foot pain, right 07/25/2014  . AA (alopecia areata) 04/25/2014  . Angina of effort ([place]) 04/25/2014  . Arthritis 04/25/2014  . CAD in native artery  04/25/2014  . History of chicken pox 04/25/2014  . Abrasion of cornea 04/25/2014  . Depression, major, single episode, mild ([place]) 04/25/2014  . HLD (hyperlipidemia) 04/25/2014  . BP (high blood pressure) 04/25/2014  . Chest pain 02/09/2012  . CAD (coronary artery disease) 02/09/2012  . S/P coronary artery stent placement 02/09/2012  . Diabetes mellitus ([place]) 02/09/2012  . Hyperlipidemia 02/09/2012    Past Surgical History:  Procedure Laterality Date  . ANKLE FUSION  2003   left ankle  . BACK SURGERY  93,97   lumb x2  . CARDIAC CATHETERIZATION  2003  . COLONOSCOPY WITH PROPOFOL N/A 11/06/2015   Procedure: COLONOSCOPY WITH PROPOFOL;  Surgeon: Robert Bellow, MD;  Location: [facility] ENDOSCOPY;  Service: Endoscopy;  Laterality: N/A;  . CORONARY ANGIOPLASTY  2006  . DORSAL COMPARTMENT RELEASE Left 12/29/2013   Procedure: LEFT WRIST RELEASE DORSAL COMPARTMENT (DEQUERVAIN);  Surgeon: Jolyn Nap, MD;  Location: [place];  Service: Orthopedics;  Laterality: Left;  . ESOPHAGOGASTRODUODENOSCOPY (EGD) WITH PROPOFOL N/A 11/06/2015   Procedure: ESOPHAGOGASTRODUODENOSCOPY (EGD) WITH PROPOFOL;  Surgeon: Robert Bellow, MD;  Location: [facility] ENDOSCOPY;  Service: Endoscopy;  Laterality: N/A;  . KNEE SURGERY  1978   rt  . NECK SURGERY  2010    His family history includes Diabetes in his father; Hyperlipidemia in his mother.     Outpatient Encounter Prescriptions as of 10/07/2016  Medication Sig  Note  . amLODipine (NORVASC) 5 MG tablet TAKE 1 TABLET BY MOUTH DAILY   . aspirin 325 MG tablet Take 1 tablet by mouth daily. 04/25/2014: Received from: [place]:   . atenolol (TENORMIN) 50 MG tablet Take 1 tablet (50 mg total) by mouth daily.   . BD PEN NEEDLE NANO U/F 32G X 4 MM MISC USE AS DIRECTED WITH TRESIBA   . Blood Glucose Monitoring Suppl (CONTOUR NEXT MONITOR) w/Device KIT USE AS DIRECTED   . glucose blood (CONTOUR NEXT TEST) test strip To  check blood sugar daily   . insulin degludec (TRESIBA) 100 UNIT/ML SOPN FlexTouch Pen Inject SQ up to 30 units daily DX E11.9   . JARDIANCE 25 MG TABS tablet TAKE 1 TABLET BY MOUTH DAILY   . lisinopril (PRINIVIL,ZESTRIL) 20 MG tablet TAKE 1 TABLET BY MOUTH DAILY.   . metFORMIN (GLUCOPHAGE) 1000 MG tablet TAKE 1 TABLET(1000 MG) BY MOUTH TWICE DAILY WITH A MEAL   . MICROLET LANCETS MISC USE AS DIRECTED ONCE DAILY   . MULTIPLE VITAMINS PO Take 1 tablet by mouth daily. 04/25/2014: Received from: [place]:   . nitroGLYCERIN (NITROSTAT) 0.4 MG SL tablet Place 0.4 mg under the tongue every 5 (five) minutes as needed.   Marland Kitchen omeprazole (PRILOSEC) 20 MG capsule Take 1 capsule (20 mg total) by mouth 2 (two) times daily before a meal.   . ranitidine (ZANTAC) 150 MG tablet Take 1 tablet (150 mg total) by mouth at bedtime.   . rosuvastatin (CRESTOR) 40 MG tablet TAKE 1 TABLET(40 MG) BY MOUTH DAILY   . sertraline (ZOLOFT) 50 MG tablet TAKE 1 TABLET BY MOUTH EVERY NIGHT AT BEDTIME.   . [DISCONTINUED] insulin degludec (TRESIBA) 100 UNIT/ML SOPN FlexTouch Pen Inject 0.14 mLs (14 Units total) into the skin daily.   Marland Kitchen ALPRAZolam (XANAX) 0.5 MG tablet Take 1 tablet by mouth every 8 (eight) hours as needed. 04/25/2014: Received from: [place]:   . folic acid (FOLVITE) 1 MG tablet    . methotrexate (RHEUMATREX) 2.5 MG tablet     No facility-administered encounter medications on file as of 10/07/2016.     Patient Care Team: Jerrol Banana., MD as PCP - General (Unknown Physician Specialty) Minna Merritts, MD as Consulting Physician (Cardiology) Jerrol Banana., MD (Family Medicine) Bary Castilla Forest Gleason, MD (General Surgery)      Objective:   Vitals:  Vitals:   10/07/16 0914  BP: 104/72  Pulse: 68  Resp: 16  Temp: 97.8 F (36.6 C)  Weight: 212 lb (96.2 kg)  Height: 5' 10"  (1.778 m)    Physical Exam  Constitutional: He is  oriented to person, place, and time. He appears well-developed and well-nourished.  HENT:  Head: Normocephalic and atraumatic.  Right Ear: External ear normal.  Left Ear: External ear normal.  Nose: Nose normal.  Mouth/Throat: Oropharynx is clear and moist.  Eyes: Conjunctivae are normal. No scleral icterus.  Neck: Neck supple. No thyromegaly present.  Cardiovascular: Normal rate, regular rhythm, normal heart sounds and intact distal pulses.   Pulmonary/Chest: Effort normal and breath sounds normal.  Abdominal: Soft.  Genitourinary: Rectum normal, prostate normal and penis normal.  Musculoskeletal: Normal range of motion.  Mild chronic swelling of left wrist  Lymphadenopathy:    He has no cervical adenopathy.  Neurological: He is alert and oriented to person, place, and time. He has normal reflexes.  Skin: Skin is warm and  dry.  Psychiatric: He has a normal mood and affect. His behavior is normal. Judgment and thought content normal.   Diabetic Foot Exam - Simple   Simple Foot Form Diabetic Foot exam was performed with the following findings:  Yes 10/07/2016 10:22 AM  Visual Inspection No deformities, no ulcerations, no other skin breakdown bilaterally:  Yes Sensation Testing Intact to touch and monofilament testing bilaterally:  Yes Pulse Check Posterior Tibialis and Dorsalis pulse intact bilaterally:  Yes Comments    Depression Screen PHQ 2/9 Scores 10/07/2016  PHQ - 2 Score 2  PHQ- 9 Score 3   Assessment & Plan:   1. Annual physical exam - CBC with Differential/Platelet - Comprehensive metabolic panel - Lipid Profile - TSH  2. Type 2 diabetes mellitus without complication, without long-term current use of insulin ([place]) Patient advised to get his diabetic eye exam done. Taking Tresiba 20 units daily. - HgB A1c - POCT UA - Microalbumin - insulin degludec (TRESIBA) 100 UNIT/ML SOPN FlexTouch Pen; Inject SQ up to 30 units daily DX E11.9  Dispense: 4 pen; Refill: 12  3.  Other hyperlipidemia 4. Seronegative rheumatoid arthritis ([place]) Following rheumatologist.  5. Need for immunization against influenza - Flu Vaccine QUAD 36+ mos IM  6. Need for 23-polyvalent pneumococcal polysaccharide vaccine - Pneumococcal polysaccharide vaccine 23-valent greater than or equal to 2yo subcutaneous/IM  7. Prostate cancer screening - PSA  8. Colon cancer screening - IFOBT POC (occult bld, rslt in office) 9. CAD  all risk factors treated  HPI, Exam and A&P transcribed by Tiffany Kocher, RMA under direction and in the presence of Miguel Aschoff, MD. I have done the exam and reviewed the chart and it is accurate to the best of my knowledge. Development worker, community has been used and  any errors in dictation or transcription are unintentional. Miguel Aschoff M.D. [facility]

## 2016-10-08 ENCOUNTER — Other Ambulatory Visit: Payer: Self-pay | Admitting: Family Medicine

## 2016-10-08 LAB — CBC WITH DIFFERENTIAL/PLATELET
BASOS ABS: 28 {cells}/uL (ref 0–200)
BASOS PCT: 0.4 %
EOS ABS: 170 {cells}/uL (ref 15–500)
Eosinophils Relative: 2.4 %
HCT: 43.8 % (ref 38.5–50.0)
HEMOGLOBIN: 14.8 g/dL (ref 13.2–17.1)
Lymphs Abs: 1505 cells/uL (ref 850–3900)
MCH: 29.2 pg (ref 27.0–33.0)
MCHC: 33.8 g/dL (ref 32.0–36.0)
MCV: 86.6 fL (ref 80.0–100.0)
MONOS PCT: 8.9 %
MPV: 9.7 fL (ref 7.5–12.5)
NEUTROS ABS: 4764 {cells}/uL (ref 1500–7800)
Neutrophils Relative %: 67.1 %
Platelets: 252 10*3/uL (ref 140–400)
RBC: 5.06 10*6/uL (ref 4.20–5.80)
RDW: 14.4 % (ref 11.0–15.0)
Total Lymphocyte: 21.2 %
WBC: 7.1 10*3/uL (ref 3.8–10.8)
WBCMIX: 632 {cells}/uL (ref 200–950)

## 2016-10-08 LAB — COMPREHENSIVE METABOLIC PANEL
AG RATIO: 1.7 (calc) (ref 1.0–2.5)
ALKALINE PHOSPHATASE (APISO): 58 U/L (ref 40–115)
ALT: 27 U/L (ref 9–46)
AST: 24 U/L (ref 10–35)
Albumin: 4.9 g/dL (ref 3.6–5.1)
BILIRUBIN TOTAL: 0.6 mg/dL (ref 0.2–1.2)
BUN: 22 mg/dL (ref 7–25)
CALCIUM: 9.9 mg/dL (ref 8.6–10.3)
CHLORIDE: 100 mmol/L (ref 98–110)
CO2: 25 mmol/L (ref 20–32)
Creat: 1.11 mg/dL (ref 0.70–1.25)
GLOBULIN: 2.9 g/dL (ref 1.9–3.7)
GLUCOSE: 155 mg/dL — AB (ref 65–99)
Potassium: 4.9 mmol/L (ref 3.5–5.3)
Sodium: 135 mmol/L (ref 135–146)
Total Protein: 7.8 g/dL (ref 6.1–8.1)

## 2016-10-08 LAB — HEMOGLOBIN A1C
EAG (MMOL/L): 10.9 (calc)
HEMOGLOBIN A1C: 8.5 %{Hb} — AB (ref <5.7)
Mean Plasma Glucose: 197 (calc)

## 2016-10-08 LAB — LIPID PANEL
CHOLESTEROL: 185 mg/dL (ref <200)
HDL: 35 mg/dL — AB (ref >40)
LDL Cholesterol (Calc): 121 mg/dL (calc) — ABNORMAL HIGH
NON-HDL CHOLESTEROL (CALC): 150 mg/dL — AB (ref <130)
TRIGLYCERIDES: 174 mg/dL — AB (ref <150)
Total CHOL/HDL Ratio: 5.3 (calc) — ABNORMAL HIGH (ref <5.0)

## 2016-10-08 LAB — PSA: PSA: 1.6 ng/mL (ref <=4.0)

## 2016-10-08 LAB — TSH: TSH: 2.26 m[IU]/L (ref 0.40–4.50)

## 2016-10-08 MED ORDER — LISINOPRIL 20 MG PO TABS: 20.0000 mg | ORAL_TABLET | Freq: Every day | ORAL | 3 refills | Status: DC

## 2016-10-08 NOTE — Telephone Encounter (Signed)
[facility] pharmacy faxed a request for the following medication. Thanks CC  lisinopril (PRINIVIL,ZESTRIL) 20 MG tablet  >Take 1 tablet by mouth every day.

## 2016-10-12 ENCOUNTER — Telehealth: Payer: Self-pay | Admitting: Family Medicine

## 2016-10-12 ENCOUNTER — Other Ambulatory Visit: Payer: Self-pay | Admitting: Family Medicine

## 2016-10-12 MED ORDER — ROSUVASTATIN CALCIUM 40 MG PO TABS: 40.0000 mg | ORAL_TABLET | Freq: Every day | ORAL | 12 refills | Status: DC

## 2016-10-12 NOTE — Telephone Encounter (Signed)
[facility] faxed a  refill request on the following medications:

## 2016-10-12 NOTE — Telephone Encounter (Signed)
[facility] faxed a refill request on the following medications:  rosuvastatin (CRESTOR) 40 MG tablet.  Take 1 tablet by mouth every day.    [facility]

## 2016-10-12 NOTE — Telephone Encounter (Signed)
Please review. Thanks!  

## 2016-11-05 ENCOUNTER — Other Ambulatory Visit: Payer: Self-pay | Admitting: Family Medicine

## 2016-11-05 DIAGNOSIS — I10 Essential (primary) hypertension: Secondary | ICD-10-CM

## 2016-11-05 MED ORDER — AMLODIPINE BESYLATE 5 MG PO TABS: 5.0000 mg | ORAL_TABLET | Freq: Every day | ORAL | 12 refills | Status: DC

## 2016-11-05 NOTE — Telephone Encounter (Signed)
[facility] faxed a refill request on the following medications:  amLODipine (NORVASC) 5 MG tablet.  Take 1 tablet by mouth every day.    [facility]

## 2016-11-05 NOTE — Telephone Encounter (Signed)
Done-Yonah Tangeman V Suren Payne, RMA  

## 2017-01-15 LAB — HM DIABETES EYE EXAM

## 2017-01-22 ENCOUNTER — Other Ambulatory Visit: Payer: Self-pay | Admitting: Family Medicine

## 2017-01-22 NOTE — Telephone Encounter (Signed)
[facility] Samuel Gentry faxed refill request for the following medications:  sertraline (ZOLOFT) 50 MG tablet  90 day supply  Last Rx: 12/16/15    LOV: 10/07/16 Please advise. Thanks TNP

## 2017-01-24 ENCOUNTER — Other Ambulatory Visit: Payer: Self-pay | Admitting: Family Medicine

## 2017-01-24 DIAGNOSIS — E119 Type 2 diabetes mellitus without complications: Secondary | ICD-10-CM

## 2017-01-25 ENCOUNTER — Ambulatory Visit: Payer: BLUE CROSS/BLUE SHIELD | Admitting: Family Medicine

## 2017-01-25 VITALS — BP 112/68 | HR 66 | Temp 97.6°F | Resp 14 | Wt 218.0 lb

## 2017-01-25 DIAGNOSIS — E119 Type 2 diabetes mellitus without complications: Secondary | ICD-10-CM

## 2017-01-25 LAB — POCT GLYCOSYLATED HEMOGLOBIN (HGB A1C): Hemoglobin A1C: 8.5

## 2017-01-25 MED ORDER — SERTRALINE HCL 50 MG PO TABS: 50.0000 mg | ORAL_TABLET | Freq: Every day | ORAL | 3 refills | Status: DC

## 2017-01-25 NOTE — Patient Instructions (Signed)
Increase Tresiba to 21-25  units

## 2017-01-25 NOTE — Progress Notes (Signed)
Samuel Gentry  MRN: [id number] DOB: May 07, 1955  Subjective:  HPI   The patient is a 62 year old male who presents for follow up o f chronic issues.  He was last seen on 10/07/16. Diabetes-Last A1C on 10/07/16 was 8.5. He is up to date on his urine mirco-albumin and his diabetic foot exam.  He is due for diabetic eye exam.  Last labs were done on 10/07/16 and included PSA, TSH, Lipids, CBC and Met C.  All were WNL except for the A1C and Lipids.  Patient Active Problem List   Diagnosis Date Noted  . Seronegative rheumatoid arthritis ([place]) 08/06/2016  . Pain of upper abdomen 10/02/2015  . Diastasis recti 10/02/2015  . Foot pain, right 07/25/2014  . AA (alopecia areata) 04/25/2014  . Angina of effort ([place]) 04/25/2014  . Arthritis 04/25/2014  . CAD in native artery 04/25/2014  . History of chicken pox 04/25/2014  . Abrasion of cornea 04/25/2014  . Depression, major, single episode, mild ([place]) 04/25/2014  . HLD (hyperlipidemia) 04/25/2014  . BP (high blood pressure) 04/25/2014  . Chest pain 02/09/2012  . CAD (coronary artery disease) 02/09/2012  . S/P coronary artery stent placement 02/09/2012  . Diabetes mellitus ([place]) 02/09/2012  . Hyperlipidemia 02/09/2012    Past Medical History:  Diagnosis Date  . Anxiety   . Arthritis   . Coronary atherosclerosis of native coronary artery 11/19/2004   2 stents placed to the LAD in [place], [place].   . DDD (degenerative disc disease), lumbar   . GERD (gastroesophageal reflux disease)   . Other and unspecified hyperlipidemia   . Status post cardiac catheterization 2003   [facility]  . Type II or unspecified type diabetes mellitus without mention of complication, not stated as uncontrolled   . Unspecified essential hypertension   . Wears glasses     Social History   Socioeconomic History  . Marital status: Married    Spouse name: Not on file  . Number of children: Not on file  . Years of education: Not on file  . Highest education  level: Not on file  Social Needs  . Financial resource strain: Not on file  . Food insecurity - worry: Not on file  . Food insecurity - inability: Not on file  . Transportation needs - medical: Not on file  . Transportation needs - non-medical: Not on file  Occupational History  . Not on file  Tobacco Use  . Smoking status: Former Smoker    Packs/day: 2.00    Years: 8.00    Pack years: 16.00    Types: Cigarettes    Last attempt to quit: 02/08/1982    Years since quitting: 34.9  . Smokeless tobacco: Never Used  Substance and Sexual Activity  . Alcohol use: No  . Drug use: No  . Sexual activity: Not on file  Other Topics Concern  . Not on file  Social History Narrative  . Not on file    Outpatient Encounter Medications as of 01/25/2017  Medication Sig Note  . ALPRAZolam (XANAX) 0.5 MG tablet Take 1 tablet by mouth every 8 (eight) hours as needed. 04/25/2014: Received from: [facility]:   . amLODipine (NORVASC) 5 MG tablet Take 1 tablet (5 mg total) by mouth daily.   Marland Kitchen aspirin 325 MG tablet Take 1 tablet by mouth daily. 04/25/2014: Received from: [facility]:   . atenolol (TENORMIN) 50 MG tablet Take 1 tablet (50 mg total) by  mouth daily.   . BD PEN NEEDLE NANO U/F 32G X 4 MM MISC USE AS DIRECTED WITH TRESIBA   . Blood Glucose Monitoring Suppl (CONTOUR NEXT MONITOR) w/Device KIT USE AS DIRECTED   . folic acid (FOLVITE) 1 MG tablet    . insulin degludec (TRESIBA) 100 UNIT/ML SOPN FlexTouch Pen Inject SQ up to 30 units daily DX E11.9   . JARDIANCE 25 MG TABS tablet TAKE 1 TABLET BY MOUTH DAILY   . lisinopril (PRINIVIL,ZESTRIL) 20 MG tablet Take 1 tablet (20 mg total) by mouth daily.   . metFORMIN (GLUCOPHAGE) 1000 MG tablet TAKE 1 TABLET(1000 MG) BY MOUTH TWICE DAILY WITH A MEAL   . methotrexate (RHEUMATREX) 2.5 MG tablet    . MICROLET LANCETS MISC USE AS DIRECTED ONCE DAILY   . MULTIPLE VITAMINS PO Take 1 tablet by mouth  daily. 04/25/2014: Received from: [facility]:   . nitroGLYCERIN (NITROSTAT) 0.4 MG SL tablet Place 0.4 mg under the tongue every 5 (five) minutes as needed.   Marland Kitchen omeprazole (PRILOSEC) 20 MG capsule Take 1 capsule (20 mg total) by mouth 2 (two) times daily before a meal.   . ranitidine (ZANTAC) 150 MG tablet Take 1 tablet (150 mg total) by mouth at bedtime.   . rosuvastatin (CRESTOR) 40 MG tablet Take 1 tablet (40 mg total) by mouth daily.   . sertraline (ZOLOFT) 50 MG tablet TAKE 1 TABLET BY MOUTH EVERY NIGHT AT BEDTIME.   Marland Kitchen diclofenac (VOLTAREN) 50 MG EC tablet    . glucose blood (CONTOUR NEXT TEST) test strip To check blood sugar daily    No facility-administered encounter medications on file as of 01/25/2017.     Allergies  Allergen Reactions  . Prednisone Other (See Comments)    Makes him very mean    Review of Systems  Constitutional: Negative.   HENT: Negative.   Eyes: Negative.   Respiratory: Negative.   Cardiovascular: Negative.   Gastrointestinal: Negative.   Musculoskeletal: Positive for joint pain.  Skin: Negative.   Neurological: Negative.   Endo/Heme/Allergies: Negative.   Psychiatric/Behavioral: Negative.     Objective:  BP 112/68 (BP Location: Right Arm, Patient Position: Sitting, Cuff Size: Large)   Pulse 66   Temp 97.6 F (36.4 C) (Oral)   Resp 14   Wt 218 lb (98.9 kg)   BMI 31.28 kg/m   Physical Exam  Constitutional: He is oriented to person, place, and time and well-developed, well-nourished, and in no distress.  HENT:  Head: Normocephalic and atraumatic.  Eyes: Conjunctivae are normal.  Neck: No thyromegaly present.  Cardiovascular: Normal rate, regular rhythm and normal heart sounds.  Pulmonary/Chest: Effort normal and breath sounds normal.  Abdominal: Soft.  Neurological: He is alert and oriented to person, place, and time. Gait normal. GCS score is 15.  Skin: Skin is warm and dry.  Psychiatric: Mood, memory,  affect and judgment normal.    Assessment and Plan :  Type 2 diabetes mellitus without complication, without long-term current use of insulin (HCC) - Plan: POCT HgB A1C--8.5 CAD Seronegative RA  I have done the exam and reviewed the chart and it is accurate to the best of my knowledge. Development worker, community has been used and  any errors in dictation or transcription are unintentional. Miguel Aschoff M.D. [facility]

## 2017-03-17 ENCOUNTER — Other Ambulatory Visit: Payer: Self-pay | Admitting: Family Medicine

## 2017-03-17 DIAGNOSIS — E119 Type 2 diabetes mellitus without complications: Secondary | ICD-10-CM

## 2017-03-17 NOTE — Telephone Encounter (Signed)
Pharmacy is requesting refill on his metFORMIN (GLUCOPHAGE) 1000 MG tablet     [facility] [place]

## 2017-03-18 MED ORDER — METFORMIN HCL 1000 MG PO TABS: 1000.0000 mg | ORAL_TABLET | Freq: Two times a day (BID) | ORAL | 12 refills | Status: DC

## 2017-05-24 ENCOUNTER — Ambulatory Visit: Payer: Self-pay | Admitting: Family Medicine

## 2017-06-15 ENCOUNTER — Ambulatory Visit: Payer: BLUE CROSS/BLUE SHIELD | Admitting: Family Medicine

## 2017-06-15 ENCOUNTER — Encounter: Payer: Self-pay | Admitting: Family Medicine

## 2017-06-15 VITALS — BP 108/64 | HR 62 | Temp 97.6°F | Resp 14 | Wt 220.0 lb

## 2017-06-15 DIAGNOSIS — M1990 Unspecified osteoarthritis, unspecified site: Secondary | ICD-10-CM

## 2017-06-15 DIAGNOSIS — E119 Type 2 diabetes mellitus without complications: Secondary | ICD-10-CM | POA: Diagnosis not present

## 2017-06-15 DIAGNOSIS — I2510 Atherosclerotic heart disease of native coronary artery without angina pectoris: Secondary | ICD-10-CM | POA: Diagnosis not present

## 2017-06-15 LAB — POCT GLYCOSYLATED HEMOGLOBIN (HGB A1C): HEMOGLOBIN A1C: 9.2 % — AB (ref 4.0–5.6)

## 2017-06-15 MED ORDER — DULAGLUTIDE 1.5 MG/0.5ML ~~LOC~~ SOAJ: 1.0000 "pen " | SUBCUTANEOUS | 11 refills | Status: DC

## 2017-06-15 NOTE — Progress Notes (Signed)
Patient: Samuel Gentry Male    DOB: [birth year]   62 y.o.   MRN: [id number] Visit Date: 06/15/2017  Today's Provider: [name], MD   Chief Complaint  Patient presents with  . Diabetes   Subjective:    HPI  Diabetes Mellitus Type II, Follow-up:   Lab Results  Component Value Date   HGBA1C 8.5 01/25/2017   HGBA1C 8.5 (H) 10/07/2016   HGBA1C 9.3 07/01/2016    Last seen for diabetes 6 months ago.  Management since then includes none. He reports good compliance with treatment. He is not having side effects.  Home blood sugar records: 200's few below 200  Episodes of hypoglycemia? no   Current Insulin Regimen: 25 units Tresiba  Most Recent Eye Exam: 01/15/17 Current exercise: walking  Pertinent Labs:    Component Value Date/Time   CHOL 185 10/07/2016 1033   CHOL 189 03/06/2016 0817   TRIG 174 (H) 10/07/2016 1033   HDL 35 (L) 10/07/2016 1033   HDL 33 (L) 03/06/2016 0817   LDLCALC 121 (H) 10/07/2016 1033   CREATININE 1.11 10/07/2016 1033    Wt Readings from Last 3 Encounters:  06/15/17 220 lb (99.8 kg)  01/25/17 218 lb (98.9 kg)  10/07/16 212 lb (96.2 kg)    ------------------------------------------------------------------------      Allergies  Allergen Reactions  . Prednisone Other (See Comments)    Makes him very mean     Current Outpatient Medications:  .  ALPRAZolam (XANAX) 0.5 MG tablet, Take 1 tablet by mouth every 8 (eight) hours as needed., Disp: , Rfl:  .  amLODipine (NORVASC) 5 MG tablet, Take 1 tablet (5 mg total) by mouth daily., Disp: 30 tablet, Rfl: 12 .  aspirin 325 MG tablet, Take 1 tablet by mouth daily., Disp: , Rfl:  .  atenolol (TENORMIN) 50 MG tablet, Take 1 tablet (50 mg total) by mouth daily., Disp: 90 tablet, Rfl: 3 .  BD PEN NEEDLE NANO U/F 32G X 4 MM MISC, USE AS DIRECTED WITH TRESIBA, Disp: 100 each, Rfl: 12 .  Blood Glucose Monitoring Suppl (CONTOUR NEXT MONITOR) w/Device KIT, USE AS DIRECTED, Disp: 1 kit,  Rfl: 0 .  diclofenac (VOLTAREN) 50 MG EC tablet, , Disp: , Rfl: 11 .  glucose blood (CONTOUR NEXT TEST) test strip, To check blood sugar daily, Disp: 100 each, Rfl: 12 .  insulin degludec (TRESIBA) 100 UNIT/ML SOPN FlexTouch Pen, Inject SQ up to 30 units daily DX E11.9, Disp: 4 pen, Rfl: 12 .  JARDIANCE 25 MG TABS tablet, TAKE 1 TABLET BY MOUTH EVERY DAY, Disp: 30 tablet, Rfl: 12 .  lisinopril (PRINIVIL,ZESTRIL) 20 MG tablet, Take 1 tablet (20 mg total) by mouth daily., Disp: 90 tablet, Rfl: 3 .  metFORMIN (GLUCOPHAGE) 1000 MG tablet, Take 1 tablet (1,000 mg total) by mouth 2 (two) times daily with a meal., Disp: 60 tablet, Rfl: 12 .  MICROLET LANCETS MISC, USE AS DIRECTED ONCE DAILY, Disp: 100 each, Rfl: 3 .  MULTIPLE VITAMINS PO, Take 1 tablet by mouth daily., Disp: , Rfl:  .  nitroGLYCERIN (NITROSTAT) 0.4 MG SL tablet, Place 0.4 mg under the tongue every 5 (five) minutes as needed., Disp: , Rfl:  .  omeprazole (PRILOSEC) 20 MG capsule, Take 1 capsule (20 mg total) by mouth 2 (two) times daily before a meal., Disp: 60 capsule, Rfl: 12 .  rosuvastatin (CRESTOR) 40 MG tablet, Take 1 tablet (40 mg total) by mouth daily., Disp: 30  tablet, Rfl: 12 .  sertraline (ZOLOFT) 50 MG tablet, Take 1 tablet (50 mg total) by mouth at bedtime., Disp: 90 tablet, Rfl: 3 .  folic acid (FOLVITE) 1 MG tablet, , Disp: , Rfl: 11 .  methotrexate (RHEUMATREX) 2.5 MG tablet, , Disp: , Rfl: 2 .  ranitidine (ZANTAC) 150 MG tablet, Take 1 tablet (150 mg total) by mouth at bedtime. (Patient not taking: Reported on 06/15/2017), Disp: 30 tablet, Rfl: 12  Review of Systems  Constitutional: Negative.   HENT: Negative.   Eyes: Negative.   Respiratory: Negative.   Cardiovascular: Negative.   Gastrointestinal: Negative.   Endocrine: Negative.   Genitourinary: Negative.   Musculoskeletal: Positive for arthralgias.  Skin: Negative.   Allergic/Immunologic: Negative.   Neurological: Negative.   Hematological: Negative.     Psychiatric/Behavioral: Negative.     Social History   Tobacco Use  . Smoking status: Former Smoker    Packs/day: 2.00    Years: 8.00    Pack years: 16.00    Types: Cigarettes    Last attempt to quit: 02/08/1982    Years since quitting: 35.3  . Smokeless tobacco: Never Used  Substance Use Topics  . Alcohol use: No   Objective:   BP 108/64 (BP Location: Left Arm, Patient Position: Sitting, Cuff Size: Large)   Pulse 62   Temp 97.6 F (36.4 C) (Oral)   Resp 14   Wt 220 lb (99.8 kg)   BMI 31.57 kg/m  Vitals:   06/15/17 1333  BP: 108/64  Pulse: 62  Resp: 14  Temp: 97.6 F (36.4 C)  TempSrc: Oral  Weight: 220 lb (99.8 kg)     Physical Exam  Constitutional: He is oriented to person, place, and time. He appears well-developed and well-nourished.  HENT:  Head: Normocephalic and atraumatic.  Right Ear: External ear normal.  Left Ear: External ear normal.  Nose: Nose normal.  Eyes: Conjunctivae are normal. Left eye exhibits no discharge.  Neck: No thyromegaly present.  Cardiovascular: Normal rate, regular rhythm and normal heart sounds.  Pulmonary/Chest: Effort normal and breath sounds normal.  Abdominal: Soft.  Musculoskeletal: He exhibits no edema.  Neurological: He is alert and oriented to person, place, and time.  Skin: Skin is warm and dry.  Psychiatric: He has a normal mood and affect. His behavior is normal. Judgment and thought content normal.        Assessment & Plan:     1. Type 2 diabetes mellitus without complication, without long-term current use of insulin ([place]) Start Trulicity. - POCT HgB A1C - Dulaglutide (TRULICITY) 1.5 UV/2.5DG SOPN; Inject 1 pen into the skin once a week.  Dispense: 4 pen; Refill: 11 2.CAD All risk factors treated. 3.OA/Arthralgia Try prn daily diclofenac.     I have done the exam and reviewed the above chart and it is accurate to the best of my knowledge. Development worker, community has been used in this note in any air is in the  dictation or transcription are unintentional.  [name], MD  [place]

## 2017-08-03 ENCOUNTER — Other Ambulatory Visit: Payer: Self-pay | Admitting: Family Medicine

## 2017-08-03 MED ORDER — ATENOLOL 50 MG PO TABS: 50.0000 mg | ORAL_TABLET | Freq: Every day | ORAL | 3 refills | Status: DC

## 2017-08-03 NOTE — Telephone Encounter (Signed)
[facility] faxed a refill request for the following medication. Thanks [facility]  atenolol (TENORMIN) 50 MG tablet

## 2017-08-27 ENCOUNTER — Other Ambulatory Visit: Payer: Self-pay | Admitting: Family Medicine

## 2017-10-10 ENCOUNTER — Other Ambulatory Visit: Payer: Self-pay | Admitting: Family Medicine

## 2017-10-10 DIAGNOSIS — E119 Type 2 diabetes mellitus without complications: Secondary | ICD-10-CM

## 2017-10-14 ENCOUNTER — Encounter: Payer: Self-pay | Admitting: Family Medicine

## 2017-10-14 ENCOUNTER — Ambulatory Visit (INDEPENDENT_AMBULATORY_CARE_PROVIDER_SITE_OTHER): Payer: BLUE CROSS/BLUE SHIELD | Admitting: Family Medicine

## 2017-10-14 VITALS — BP 122/82 | HR 70 | Temp 97.7°F | Ht 70.0 in | Wt 216.6 lb

## 2017-10-14 DIAGNOSIS — Z0000 Encounter for general adult medical examination without abnormal findings: Secondary | ICD-10-CM | POA: Diagnosis not present

## 2017-10-14 DIAGNOSIS — Z23 Encounter for immunization: Secondary | ICD-10-CM | POA: Diagnosis not present

## 2017-10-14 DIAGNOSIS — Z125 Encounter for screening for malignant neoplasm of prostate: Secondary | ICD-10-CM

## 2017-10-14 DIAGNOSIS — E7849 Other hyperlipidemia: Secondary | ICD-10-CM | POA: Diagnosis not present

## 2017-10-14 DIAGNOSIS — I10 Essential (primary) hypertension: Secondary | ICD-10-CM | POA: Diagnosis not present

## 2017-10-14 DIAGNOSIS — I2510 Atherosclerotic heart disease of native coronary artery without angina pectoris: Secondary | ICD-10-CM

## 2017-10-14 DIAGNOSIS — F320 Major depressive disorder, single episode, mild: Secondary | ICD-10-CM

## 2017-10-14 DIAGNOSIS — M0600 Rheumatoid arthritis without rheumatoid factor, unspecified site: Secondary | ICD-10-CM

## 2017-10-14 DIAGNOSIS — E119 Type 2 diabetes mellitus without complications: Secondary | ICD-10-CM | POA: Diagnosis not present

## 2017-10-14 DIAGNOSIS — E782 Mixed hyperlipidemia: Secondary | ICD-10-CM

## 2017-10-14 DIAGNOSIS — M84375A Stress fracture, left foot, initial encounter for fracture: Secondary | ICD-10-CM

## 2017-10-14 DIAGNOSIS — Z114 Encounter for screening for human immunodeficiency virus [HIV]: Secondary | ICD-10-CM

## 2017-10-14 NOTE — Progress Notes (Signed)
Patient: Samuel Gentry, Male    DOB: 1955-04-05, 62 y.o.   MRN: [id number] Visit Date: 10/14/2017  Today's Provider: [name], MD   Chief Complaint  Patient presents with  . Annual Exam   Subjective:    Annual physical exam Samuel Gentry is a 62 y.o. male who presents today for health maintenance and complete physical. He feels fairly well. He reports exercising none at this time due to a foot fracture. He reports he is sleeping well.  ----------------------------------------------------------------- Colonoscopy: 11/06/2015 Tdap: 01/02/2010  Review of Systems  Constitutional: Negative.   HENT: Positive for hearing loss.   Eyes: Negative.   Respiratory: Negative.   Cardiovascular: Negative.   Gastrointestinal: Negative.   Endocrine: Negative.   Genitourinary: Negative.   Musculoskeletal: Negative.   Skin: Negative.   Allergic/Immunologic: Negative.   Neurological: Negative.   Hematological: Negative.   Psychiatric/Behavioral: Negative.     Social History      He  reports that he quit smoking about 35 years ago. His smoking use included cigarettes. He has a 16.00 pack-year smoking history. He has never used smokeless tobacco. He reports that he does not drink alcohol or use drugs.       Social History   Socioeconomic History  . Marital status: Married    Spouse name: Not on file  . Number of children: Not on file  . Years of education: Not on file  . Highest education level: Not on file  Occupational History  . Not on file  Social Needs  . Financial resource strain: Not on file  . Food insecurity:    Worry: Not on file    Inability: Not on file  . Transportation needs:    Medical: Not on file    Non-medical: Not on file  Tobacco Use  . Smoking status: Former Smoker    Packs/day: 2.00    Years: 8.00    Pack years: 16.00    Types: Cigarettes    Last attempt to quit: 02/08/1982    Years since quitting: 35.7  . Smokeless tobacco: Never  Used  Substance and Sexual Activity  . Alcohol use: No  . Drug use: No  . Sexual activity: Not on file  Lifestyle  . Physical activity:    Days per week: Not on file    Minutes per session: Not on file  . Stress: Not on file  Relationships  . Social connections:    Talks on phone: Not on file    Gets together: Not on file    Attends religious service: Not on file    Active member of club or organization: Not on file    Attends meetings of clubs or organizations: Not on file    Relationship status: Not on file  Other Topics Concern  . Not on file  Social History Narrative  . Not on file    Past Medical History:  Diagnosis Date  . Anxiety   . Arthritis   . Coronary atherosclerosis of native coronary artery 11/19/2004   2 stents placed to the LAD in [place], [place].   . DDD (degenerative disc disease), lumbar   . GERD (gastroesophageal reflux disease)   . Other and unspecified hyperlipidemia   . Status post cardiac catheterization 2003   [facility]  . Type II or unspecified type diabetes mellitus without mention of complication, not stated as uncontrolled   . Unspecified essential hypertension   . Wears glasses  Patient Active Problem List   Diagnosis Date Noted  . Seronegative rheumatoid arthritis ([place]) 08/06/2016  . Pain of upper abdomen 10/02/2015  . Diastasis recti 10/02/2015  . Foot pain, right 07/25/2014  . AA (alopecia areata) 04/25/2014  . Angina of effort ([place]) 04/25/2014  . Arthritis 04/25/2014  . CAD in native artery 04/25/2014  . History of chicken pox 04/25/2014  . Abrasion of cornea 04/25/2014  . Depression, major, single episode, mild ([place]) 04/25/2014  . HLD (hyperlipidemia) 04/25/2014  . BP (high blood pressure) 04/25/2014  . Chest pain 02/09/2012  . CAD (coronary artery disease) 02/09/2012  . S/P coronary artery stent placement 02/09/2012  . Diabetes mellitus ([place]) 02/09/2012  . Hyperlipidemia 02/09/2012    Past Surgical History:  Procedure  Laterality Date  . ANKLE FUSION  2003   left ankle  . BACK SURGERY  93,97   lumb x2  . CARDIAC CATHETERIZATION  2003  . COLONOSCOPY WITH PROPOFOL N/A 11/06/2015   Procedure: COLONOSCOPY WITH PROPOFOL;  Surgeon: Robert Bellow, MD;  Location: [facility] ENDOSCOPY;  Service: Endoscopy;  Laterality: N/A;  . CORONARY ANGIOPLASTY  2006  . DORSAL COMPARTMENT RELEASE Left 12/29/2013   Procedure: LEFT WRIST RELEASE DORSAL COMPARTMENT ([place]);  Surgeon: Jolyn Nap, MD;  Location: [place];  Service: Orthopedics;  Laterality: Left;  . ESOPHAGOGASTRODUODENOSCOPY (EGD) WITH PROPOFOL N/A 11/06/2015   Procedure: ESOPHAGOGASTRODUODENOSCOPY (EGD) WITH PROPOFOL;  Surgeon: Robert Bellow, MD;  Location: [facility] ENDOSCOPY;  Service: Endoscopy;  Laterality: N/A;  . KNEE SURGERY  1978   rt  . NECK SURGERY  2010    Family History        Family Status  Relation Name Status  . Mother  Alive  . Father  Deceased       diabetes  . Brother  Alive        His family history includes Diabetes in his father; Hyperlipidemia in his mother.      Allergies  Allergen Reactions  . Prednisone Other (See Comments)    Makes him very mean     Current Outpatient Medications:  .  ALPRAZolam (XANAX) 0.5 MG tablet, Take 1 tablet by mouth every 8 (eight) hours as needed., Disp: , Rfl:  .  amLODipine (NORVASC) 5 MG tablet, Take 1 tablet (5 mg total) by mouth daily., Disp: 30 tablet, Rfl: 12 .  aspirin 325 MG tablet, Take 1 tablet by mouth daily., Disp: , Rfl:  .  atenolol (TENORMIN) 50 MG tablet, Take 1 tablet (50 mg total) by mouth daily., Disp: 90 tablet, Rfl: 3 .  BD PEN NEEDLE NANO U/F 32G X 4 MM MISC, USE AS DIRECTED WITH TRESIBA, Disp: 100 each, Rfl: 11 .  Blood Glucose Monitoring Suppl (CONTOUR NEXT MONITOR) w/Device KIT, USE AS DIRECTED, Disp: 1 kit, Rfl: 0 .  diclofenac (VOLTAREN) 50 MG EC tablet, , Disp: , Rfl: 11 .  Dulaglutide (TRULICITY) 1.5 ZO/1.0RU SOPN, Inject 1 pen into the  skin once a week., Disp: 4 pen, Rfl: 11 .  glucose blood (CONTOUR NEXT TEST) test strip, To check blood sugar daily, Disp: 100 each, Rfl: 12 .  JARDIANCE 25 MG TABS tablet, TAKE 1 TABLET BY MOUTH EVERY DAY, Disp: 30 tablet, Rfl: 12 .  lisinopril (PRINIVIL,ZESTRIL) 20 MG tablet, TAKE 1 TABLET(20 MG) BY MOUTH DAILY, Disp: 90 tablet, Rfl: 0 .  metFORMIN (GLUCOPHAGE) 1000 MG tablet, Take 1 tablet (1,000 mg total) by mouth 2 (two) times daily with a meal., Disp: 60  tablet, Rfl: 12 .  methotrexate (RHEUMATREX) 2.5 MG tablet, , Disp: , Rfl: 2 .  MICROLET LANCETS MISC, USE AS DIRECTED ONCE DAILY, Disp: 100 each, Rfl: 3 .  MULTIPLE VITAMINS PO, Take 1 tablet by mouth daily., Disp: , Rfl:  .  nitroGLYCERIN (NITROSTAT) 0.4 MG SL tablet, Place 0.4 mg under the tongue every 5 (five) minutes as needed., Disp: , Rfl:  .  omeprazole (PRILOSEC) 20 MG capsule, Take 1 capsule (20 mg total) by mouth 2 (two) times daily before a meal., Disp: 60 capsule, Rfl: 12 .  rosuvastatin (CRESTOR) 40 MG tablet, Take 1 tablet (40 mg total) by mouth daily., Disp: 30 tablet, Rfl: 12 .  sertraline (ZOLOFT) 50 MG tablet, Take 1 tablet (50 mg total) by mouth at bedtime., Disp: 90 tablet, Rfl: 3 .  TRESIBA FLEXTOUCH 100 UNIT/ML SOPN FlexTouch Pen, INJECT UNDER THE SKIN UP 30 UNITS DAILY AS DIRECTED., Disp: 12 pen, Rfl: 0 .  folic acid (FOLVITE) 1 MG tablet, , Disp: , Rfl: 11 .  ranitidine (ZANTAC) 150 MG tablet, Take 1 tablet (150 mg total) by mouth at bedtime. (Patient not taking: Reported on 10/14/2017), Disp: 30 tablet, Rfl: 12   Patient Care Team: Jerrol Banana., MD as PCP - General (Unknown Physician Specialty) Minna Merritts, MD as Consulting Physician (Cardiology) Jerrol Banana., MD (Family Medicine) Bary Castilla, Forest Gleason, MD (General Surgery)      Objective:   Vitals: BP 122/82 (BP Location: Left Arm, Patient Position: Sitting, Cuff Size: Normal)   Pulse 70   Temp 97.7 F (36.5 C) (Oral)   Ht 5' 10"  (1.778 m)   Wt 216 lb 9.6 oz (98.2 kg)   SpO2 96%   BMI 31.08 kg/m    Vitals:   10/14/17 0909  BP: 122/82  Pulse: 70  Temp: 97.7 F (36.5 C)  TempSrc: Oral  SpO2: 96%  Weight: 216 lb 9.6 oz (98.2 kg)  Height: 5' 10" (1.778 m)     Physical Exam  Constitutional: He is oriented to person, place, and time. He appears well-developed and well-nourished.  HENT:  Head: Normocephalic and atraumatic.  Right Ear: External ear normal.  Left Ear: External ear normal.  Mouth/Throat: Oropharynx is clear and moist.  Eyes: Conjunctivae are normal. No scleral icterus.  Neck: No thyromegaly present.  Cardiovascular: Normal rate, regular rhythm, normal heart sounds and intact distal pulses.  Pulmonary/Chest: Effort normal and breath sounds normal.  Abdominal: Soft.  Musculoskeletal: He exhibits no edema.  Neurological: He is alert and oriented to person, place, and time.  Skin: Skin is warm and dry.  Psychiatric: He has a normal mood and affect. His behavior is normal. Judgment and thought content normal.     Depression Screen PHQ 2/9 Scores 10/14/2017 10/07/2016  PHQ - 2 Score 0 2  PHQ- 9 Score 0 3      Assessment & Plan:     Routine Health Maintenance and Physical Exam  Exercise Activities and Dietary recommendations Goals   None     Immunization History  Administered Date(s) Administered  . Influenza,inj,Quad PF,6+ Mos 11/25/2015, 10/07/2016, 10/14/2017  . Pneumococcal Polysaccharide-23 10/07/2016  . Tdap 01/02/2010    Health Maintenance  Topic Date Due  . HIV Screening  08/26/1970  . FOOT EXAM  10/07/2017  . HEMOGLOBIN A1C  12/15/2017  . OPHTHALMOLOGY EXAM  01/15/2018  . TETANUS/TDAP  01/03/2020  . COLONOSCOPY  11/05/2025  . INFLUENZA VACCINE  Completed  . PNEUMOCOCCAL POLYSACCHARIDE  VACCINE AGE 77-64 HIGH RISK  Completed  . Hepatitis C Screening  Completed     Discussed health benefits of physical activity, and encouraged him to engage in regular exercise  appropriate for his age and condition.  1. Annual physical exam   2. Type 2 diabetes mellitus without complication, without long-term current use of insulin (HCC)  - HgB A1c  3. Other hyperlipidemia  - Lipid Profile  4. Essential hypertension  - CBC with Differential - TSH - Comprehensive Metabolic Panel (CMET)  5. Encounter for screening for HIV  - HIV Antibody (routine testing w rflx)  6. Prostate cancer screening  - PSA  7. Stress fracture, left foot, initial encounter for fracture   8. CAD in native artery   9. Seronegative rheumatoid arthritis ([place])   10. Mixed hyperlipidemia   11. Depression, major, single episode, mild ([place])     --------------------------------------------------------------------   I have done the exam and reviewed the above chart and it is accurate to the best of my knowledge. Development worker, community has been used in this note in any air is in the dictation or transcription are unintentional.  [name], MD  [place]

## 2017-10-15 ENCOUNTER — Telehealth: Payer: Self-pay

## 2017-10-15 LAB — COMPREHENSIVE METABOLIC PANEL
ALBUMIN: 4.9 g/dL — AB (ref 3.6–4.8)
ALK PHOS: 59 IU/L (ref 39–117)
ALT: 47 IU/L — ABNORMAL HIGH (ref 0–44)
AST: 34 IU/L (ref 0–40)
Albumin/Globulin Ratio: 2.1 (ref 1.2–2.2)
BILIRUBIN TOTAL: 0.4 mg/dL (ref 0.0–1.2)
BUN / CREAT RATIO: 21 (ref 10–24)
BUN: 25 mg/dL (ref 8–27)
CHLORIDE: 101 mmol/L (ref 96–106)
CO2: 21 mmol/L (ref 20–29)
Calcium: 10.1 mg/dL (ref 8.6–10.2)
Creatinine, Ser: 1.17 mg/dL (ref 0.76–1.27)
GFR calc Af Amer: 77 mL/min/{1.73_m2} (ref >59)
GFR calc non Af Amer: 66 mL/min/{1.73_m2} (ref >59)
GLOBULIN, TOTAL: 2.3 g/dL (ref 1.5–4.5)
GLUCOSE: 153 mg/dL — AB (ref 65–99)
Potassium: 4.9 mmol/L (ref 3.5–5.2)
SODIUM: 137 mmol/L (ref 134–144)
Total Protein: 7.2 g/dL (ref 6.0–8.5)

## 2017-10-15 LAB — LIPID PANEL
CHOLESTEROL TOTAL: 152 mg/dL (ref 100–199)
Chol/HDL Ratio: 4.5 ratio (ref 0.0–5.0)
HDL: 34 mg/dL — ABNORMAL LOW (ref >39)
LDL Calculated: 84 mg/dL (ref 0–99)
Triglycerides: 170 mg/dL — ABNORMAL HIGH (ref 0–149)
VLDL CHOLESTEROL CAL: 34 mg/dL (ref 5–40)

## 2017-10-15 LAB — HEMOGLOBIN A1C
Est. average glucose Bld gHb Est-mCnc: 180 mg/dL
HEMOGLOBIN A1C: 7.9 % — AB (ref 4.8–5.6)

## 2017-10-15 LAB — CBC WITH DIFFERENTIAL/PLATELET
BASOS ABS: 0 10*3/uL (ref 0.0–0.2)
Basos: 1 %
EOS (ABSOLUTE): 0.3 10*3/uL (ref 0.0–0.4)
Eos: 5 %
HEMOGLOBIN: 14.4 g/dL (ref 13.0–17.7)
Hematocrit: 42.6 % (ref 37.5–51.0)
Immature Grans (Abs): 0 10*3/uL (ref 0.0–0.1)
Immature Granulocytes: 1 %
LYMPHS: 28 %
Lymphocytes Absolute: 1.7 10*3/uL (ref 0.7–3.1)
MCH: 29.5 pg (ref 26.6–33.0)
MCHC: 33.8 g/dL (ref 31.5–35.7)
MCV: 87 fL (ref 79–97)
MONOCYTES: 9 %
Monocytes Absolute: 0.5 10*3/uL (ref 0.1–0.9)
NEUTROS ABS: 3.4 10*3/uL (ref 1.4–7.0)
Neutrophils: 56 %
Platelets: 199 10*3/uL (ref 150–450)
RBC: 4.88 x10E6/uL (ref 4.14–5.80)
RDW: 13.1 % (ref 12.3–15.4)
WBC: 6 10*3/uL (ref 3.4–10.8)

## 2017-10-15 LAB — HIV ANTIBODY (ROUTINE TESTING W REFLEX): HIV SCREEN 4TH GENERATION: NONREACTIVE

## 2017-10-15 LAB — PSA: Prostate Specific Ag, Serum: 1.4 ng/mL (ref 0.0–4.0)

## 2017-10-15 LAB — TSH: TSH: 2.56 u[IU]/mL (ref 0.450–4.500)

## 2017-10-15 NOTE — Telephone Encounter (Signed)
Recorded message that call can not be completed at this time   ----- Message from Maple Hudson., MD sent at 10/15/2017  9:38 AM EDT ----- Stable.

## 2017-10-15 NOTE — Telephone Encounter (Signed)
-----   Message from Maple Hudson., MD sent at 10/15/2017  9:38 AM EDT ----- Stable.

## 2017-10-16 DIAGNOSIS — M84375A Stress fracture, left foot, initial encounter for fracture: Secondary | ICD-10-CM | POA: Insufficient documentation

## 2017-10-18 ENCOUNTER — Other Ambulatory Visit: Payer: Self-pay | Admitting: Family Medicine

## 2017-10-20 NOTE — Telephone Encounter (Signed)
Advised  ED 

## 2017-11-10 ENCOUNTER — Other Ambulatory Visit: Payer: Self-pay | Admitting: Family Medicine

## 2017-11-11 NOTE — Telephone Encounter (Signed)
Pharmacy requesting refills. Thanks!  

## 2017-11-16 ENCOUNTER — Other Ambulatory Visit: Payer: Self-pay | Admitting: Family Medicine

## 2017-11-16 DIAGNOSIS — E119 Type 2 diabetes mellitus without complications: Secondary | ICD-10-CM

## 2017-11-16 DIAGNOSIS — I10 Essential (primary) hypertension: Secondary | ICD-10-CM

## 2018-01-10 ENCOUNTER — Other Ambulatory Visit: Payer: Self-pay | Admitting: Family Medicine

## 2018-01-24 ENCOUNTER — Telehealth: Payer: Self-pay | Admitting: Family Medicine

## 2018-01-24 DIAGNOSIS — E119 Type 2 diabetes mellitus without complications: Secondary | ICD-10-CM

## 2018-01-24 NOTE — Telephone Encounter (Signed)
Samuel Gentry's wife, Lynden Ang, called stating they cannot afford Trulicity or [place].  He has went on [employer] and the price of the insulin is still $600+ a mth.   Wanted to know if we had sample we could give or if you wanted to change him to something more affordable.

## 2018-01-24 NOTE — Telephone Encounter (Signed)
Please review. We do have samples. Or do you want to change to something different? Please advise. Thanks!

## 2018-01-25 NOTE — Telephone Encounter (Signed)
Can we see if CCM can try to help get the meds?

## 2018-01-25 NOTE — Telephone Encounter (Signed)
Order for CCM was placed.

## 2018-01-26 ENCOUNTER — Ambulatory Visit: Payer: Self-pay | Admitting: Pharmacist

## 2018-01-26 DIAGNOSIS — I10 Essential (primary) hypertension: Secondary | ICD-10-CM

## 2018-01-26 DIAGNOSIS — E119 Type 2 diabetes mellitus without complications: Secondary | ICD-10-CM

## 2018-01-26 DIAGNOSIS — E7849 Other hyperlipidemia: Secondary | ICD-10-CM

## 2018-01-26 NOTE — Patient Instructions (Signed)
Samuel Gentry was given information about Care Management services today including:  1. Case Management services includes personalized support from designated clinical staff supervised by his physician, including individualized plan of care and coordination with other care providers 2. 24/7 contact phone numbers for assistance for urgent and routine care needs. 3. The patient may stop case management services at any time by phone call to the office staff.  Patient agreed to services and verbal consent obtained.  Please call a member of the CCM (Chronic Care Management) Team with any questions or case management needs:   Arthur Holms, BSN Nurse Care Coordinator  [phone]  Karalee Height, PharmD  Clinical Pharmacist  [phone]

## 2018-01-26 NOTE — Chronic Care Management (AMB) (Signed)
   Care Management   Note  01/26/2018 Name: Samuel Gentry MRN: [id number] DOB: Mar 28, 1955   Samuel Gentry is a 63 y.o. year old male who sees Samuel HudsonGilbert, [name], MD for primary care. Dr. Sullivan Gentry asked the CCM team to consult the patient for assistance with chronic disease management related to Trulicity, Tresiba. Referral was placed 01/25/18. Telephone outreach to patient today to introduce care management services.   Goals Addressed            This Visit's Progress   . "I need help affording my medications" (pt-stated)       Pharmacist Clinical Goal(s): Over the next 10 days, [name] will provide the necessary supplementary documents (proof of out of pocket prescription expenditure, proof of household income) needed for medication assistance applications to CCM pharmacist.   Interventions:  CCM pharmacist will investigate different medication assistance foundations and programs for Samuel Gentry to apply for, primarily for Trulicity and [place]-B[place]iba.   Current Barriers:  . No prescription insurance coverage . Not able to use manufacturer's coupons due to lack of commercial coverage  Patient Self Care Activities:  . Collaborate with CCM pharmacist and provide necessary paperwork    Please see past updates related to this goal by clicking on the "Past Updates" button in the selected goal          Referral source: Dr. Sullivan Gentry Referral medication(s): Samuel Gentry, Samuel Gentry Current insurance: [employer] (hospital coverage only, NO prescription coverage)  Plan: Samuel Gentry agreed that care management services would be helpful to him and verbal consent was obtained. I have scheduled an initial telephone visit for 02/02/18   Samuel Gentry was given information about Care Management services today including:  1. Case Management services includes personalized support from designated clinical staff supervised by his physician, including individualized plan of care and coordination with  other care providers 2. 24/7 contact phone numbers for assistance for urgent and routine care needs. 3. The patient may stop case management services at any time by phone call to the office staff.  Samuel Gentry, PharmD Clinical Pharmacist [facility] [phone]

## 2018-02-02 ENCOUNTER — Ambulatory Visit: Payer: BLUE CROSS/BLUE SHIELD | Admitting: Pharmacist

## 2018-02-02 DIAGNOSIS — I10 Essential (primary) hypertension: Secondary | ICD-10-CM

## 2018-02-02 DIAGNOSIS — E7849 Other hyperlipidemia: Secondary | ICD-10-CM

## 2018-02-02 DIAGNOSIS — E119 Type 2 diabetes mellitus without complications: Secondary | ICD-10-CM

## 2018-02-02 NOTE — Patient Instructions (Signed)
Appointment scheduled for 02/03/2018 for 2:00 pm Thank you for allowing the Care Management team to be a part of your care!   The patient verbalized understanding of instructions provided today and declined a print copy of patient instruction materials.

## 2018-02-02 NOTE — Chronic Care Management (AMB) (Signed)
  Care Management   Note  02/02/2018 Name: Samuel Gentry MRN: [id number] DOB: Feb 10, 1955  Follow up telephone call with Samuel Gentry today regarding medication assistance applications for Jardiance ([employer]), [place] ([employer]) and Science writer ([employer]).   HIPAA identifiers verified. Patient made appointment with CCM pharmacist to complete applications for tomorrow afternoon.   Goals Addressed            This Visit's Progress   . "I need help affording my medications" (pt-stated)   On track    Pharmacist Clinical Goal(s): Over the next 10 days, Samuel Gentry will provide the necessary supplementary documents (proof of out of pocket prescription expenditure, proof of household income) needed for medication assistance applications to CCM pharmacist.   Interventions:  CCM pharmacist will investigate different medication assistance foundations and programs for Samuel Gentry to apply for, primarily for Trulicity and [place].   Current Barriers:  . No prescription insurance coverage . Not able to use manufacturer's coupons due to lack of commercial coverage  Patient Self Care Activities:  . Collaborate with CCM pharmacist and provide necessary paperwork    Please see past updates related to this goal by clicking on the "Past Updates" button in the selected goal          Follow Up Plan:  Face to Face appointment with CCM team member scheduled for:  02/03/18  Karalee Height, PharmD Clinical Pharmacist [facility] [phone]

## 2018-02-03 ENCOUNTER — Ambulatory Visit: Payer: BLUE CROSS/BLUE SHIELD | Admitting: Pharmacist

## 2018-02-03 DIAGNOSIS — I10 Essential (primary) hypertension: Secondary | ICD-10-CM

## 2018-02-03 DIAGNOSIS — E7849 Other hyperlipidemia: Secondary | ICD-10-CM

## 2018-02-03 DIAGNOSIS — E119 Type 2 diabetes mellitus without complications: Secondary | ICD-10-CM

## 2018-02-03 NOTE — Chronic Care Management (AMB) (Signed)
  Care Management   Note  02/03/2018 Name: TOBI CONTO MRN: [id number] DOB: 28-Dec-1955  Patient presents to Care Management clinic today to sign medication assistance applications and provide financial documentation.   Goals Addressed            This Visit's Progress   . "I need help affording my medications" (pt-stated)   On track    Pharmacist Clinical Goal(s): Over the next 10 days, Mr.. Starwalt will provide the necessary supplementary documents (proof of out of pocket prescription expenditure, proof of household income) needed for medication assistance applications to CCM pharmacist.   Interventions:  CCM pharmacist will investigate different medication assistance foundations and programs for Mr. Steffy to apply for, primarily for Trulicity and [place].   Current Barriers:  . No prescription insurance coverage . Not able to use manufacturer's coupons due to lack of commercial coverage  Patient Self Care Activities:  . Collaborate with CCM pharmacist and provide necessary paperwork    Please see past updates related to this goal by clicking on the "Past Updates" button in the selected goal           Follow Up Plan: Telephone follow up appointment with CCM team member scheduled for:  Karalee Height, PharmD Clinical Pharmacist [facility] [phone]

## 2018-02-03 NOTE — Patient Instructions (Addendum)
Goals Addressed            This Visit's Progress   . "I need help affording my medications" (pt-stated)   On track    Pharmacist Clinical Goal(s): Over the next 10 days, Mr.. Shimanek will provide the necessary supplementary documents (proof of out of pocket prescription expenditure, proof of household income) needed for medication assistance applications to CCM pharmacist.   Interventions:  CCM pharmacist will investigate different medication assistance foundations and programs for Mr. Pessin to apply for, primarily for Trulicity and [place].   Current Barriers:  . No prescription insurance coverage . Not able to use manufacturer's coupons due to lack of commercial coverage  Patient Self Care Activities:  . Collaborate with CCM pharmacist and provide necessary paperwork    Please see past updates related to this goal by clicking on the "Past Updates" button in the selected goal          Thank you allowing the Chronic Care Management Team to be a part of your care!   Please call a member of the CCM (Chronic Care Management) Team with any questions or case management needs:   Arthur Holms, BSN Nurse Care Coordinator  [phone]  Karalee Height, PharmD  Clinical Pharmacist  [phone]

## 2018-02-07 ENCOUNTER — Other Ambulatory Visit: Payer: Self-pay | Admitting: Family Medicine

## 2018-02-07 DIAGNOSIS — E119 Type 2 diabetes mellitus without complications: Secondary | ICD-10-CM

## 2018-02-17 ENCOUNTER — Other Ambulatory Visit: Payer: Self-pay

## 2018-02-17 ENCOUNTER — Ambulatory Visit (INDEPENDENT_AMBULATORY_CARE_PROVIDER_SITE_OTHER): Payer: Self-pay | Admitting: Family Medicine

## 2018-02-17 VITALS — BP 130/86 | HR 72 | Temp 98.1°F | Resp 16 | Wt 220.0 lb

## 2018-02-17 DIAGNOSIS — R7989 Other specified abnormal findings of blood chemistry: Secondary | ICD-10-CM

## 2018-02-17 DIAGNOSIS — I10 Essential (primary) hypertension: Secondary | ICD-10-CM

## 2018-02-17 DIAGNOSIS — E119 Type 2 diabetes mellitus without complications: Secondary | ICD-10-CM

## 2018-02-17 DIAGNOSIS — R945 Abnormal results of liver function studies: Secondary | ICD-10-CM

## 2018-02-17 NOTE — Progress Notes (Signed)
Samuel Gentry  MRN: [id number] DOB: 10/17/1955  Subjective:  HPI   The patient is  63 year old male who presents for follow up of his diabetes.  He was last seen on 10/14/17 for his annual wellness.  His A1C at that time was 7.9 which was down from his prior one in June which was 9.2.   The patient also had his other routine labs at that time.  His lipid panel was stable however he did have a slightly elevated ALT of 47, which had been elevated in 2017 but normal since then until now.   Patient has not been checking his glucose regularly.  He states he feels well and has no complaints.   He does mention that he thinks he had a kidney stone before the holidays and passed it with ease.  Patient Active Problem List   Diagnosis Date Noted  . Stress fracture, left foot, initial encounter for fracture 10/16/2017  . Seronegative rheumatoid arthritis ([place]) 08/06/2016  . Pain of upper abdomen 10/02/2015  . Diastasis recti 10/02/2015  . Foot pain, right 07/25/2014  . AA (alopecia areata) 04/25/2014  . Angina of effort ([place]) 04/25/2014  . Arthritis 04/25/2014  . CAD in native artery 04/25/2014  . History of chicken pox 04/25/2014  . Abrasion of cornea 04/25/2014  . Depression, major, single episode, mild ([place]) 04/25/2014  . HLD (hyperlipidemia) 04/25/2014  . BP (high blood pressure) 04/25/2014  . Chest pain 02/09/2012  . CAD (coronary artery disease) 02/09/2012  . S/P coronary artery stent placement 02/09/2012  . Diabetes mellitus ([place]) 02/09/2012  . Hyperlipidemia 02/09/2012    Past Medical History:  Diagnosis Date  . Anxiety   . Arthritis   . Coronary atherosclerosis of native coronary artery 11/19/2004   2 stents placed to the LAD in [place], [place].   . DDD (degenerative disc disease), lumbar   . GERD (gastroesophageal reflux disease)   . Other and unspecified hyperlipidemia   . Status post cardiac catheterization 2003   [facility]  . Type II or unspecified type diabetes mellitus  without mention of complication, not stated as uncontrolled   . Unspecified essential hypertension   . Wears glasses     Social History   Socioeconomic History  . Marital status: Married    Spouse name: Not on file  . Number of children: Not on file  . Years of education: Not on file  . Highest education level: Not on file  Occupational History  . Not on file  Social Needs  . Financial resource strain: Not on file  . Food insecurity:    Worry: Not on file    Inability: Not on file  . Transportation needs:    Medical: Not on file    Non-medical: Not on file  Tobacco Use  . Smoking status: Former Smoker    Packs/day: 2.00    Years: 8.00    Pack years: 16.00    Types: Cigarettes    Last attempt to quit: 02/08/1982    Years since quitting: 36.0  . Smokeless tobacco: Never Used  Substance and Sexual Activity  . Alcohol use: No  . Drug use: No  . Sexual activity: Not on file  Lifestyle  . Physical activity:    Days per week: Not on file    Minutes per session: Not on file  . Stress: Not on file  Relationships  . Social connections:    Talks on phone: Not on file  Gets together: Not on file    Attends religious service: Not on file    Active member of club or organization: Not on file    Attends meetings of clubs or organizations: Not on file    Relationship status: Not on file  . Intimate partner violence:    Fear of current or ex partner: Not on file    Emotionally abused: Not on file    Physically abused: Not on file    Forced sexual activity: Not on file  Other Topics Concern  . Not on file  Social History Narrative  . Not on file    Outpatient Encounter Medications as of 02/17/2018  Medication Sig  . ALPRAZolam (XANAX) 0.5 MG tablet Take 1 tablet by mouth every 8 (eight) hours as needed.  Marland Kitchen amLODipine (NORVASC) 5 MG tablet TAKE 1 TABLET(5 MG) BY MOUTH DAILY  . aspirin 325 MG tablet Take 1 tablet by mouth daily.  Marland Kitchen atenolol (TENORMIN) 50 MG tablet Take 1  tablet (50 mg total) by mouth daily.  . BD PEN NEEDLE NANO U/F 32G X 4 MM MISC USE AS DIRECTED WITH TRESIBA  . Blood Glucose Monitoring Suppl (CONTOUR NEXT MONITOR) w/Device KIT USE AS DIRECTED  . diclofenac (VOLTAREN) 50 MG EC tablet   . Dulaglutide (TRULICITY) 1.5 PN/3.6RW SOPN Inject 1 pen into the skin once a week.  Marland Kitchen glucose blood (CONTOUR NEXT TEST) test strip To check blood sugar daily  . JARDIANCE 25 MG TABS tablet TAKE 1 TABLET BY MOUTH EVERY DAY  . lisinopril (PRINIVIL,ZESTRIL) 20 MG tablet TAKE 1 TABLET(20 MG) BY MOUTH DAILY  . metFORMIN (GLUCOPHAGE) 1000 MG tablet Take 1 tablet (1,000 mg total) by mouth 2 (two) times daily with a meal.  . MICROLET LANCETS MISC USE AS DIRECTED ONCE DAILY  . MULTIPLE VITAMINS PO Take 1 tablet by mouth daily.  . nitroGLYCERIN (NITROSTAT) 0.4 MG SL tablet Place 0.4 mg under the tongue every 5 (five) minutes as needed.  Marland Kitchen omeprazole (PRILOSEC) 20 MG capsule Take 1 capsule (20 mg total) by mouth 2 (two) times daily before a meal.  . rosuvastatin (CRESTOR) 40 MG tablet TAKE 1 TABLET(40 MG) BY MOUTH DAILY  . sertraline (ZOLOFT) 50 MG tablet TAKE 1 TABLET(50 MG) BY MOUTH AT BEDTIME  . TRESIBA FLEXTOUCH 100 UNIT/ML SOPN FlexTouch Pen INJECT UNDER THE SKIN UP TO 30 UNITS DAILY EVERY DAY AS DIRECTED  . [DISCONTINUED] folic acid (FOLVITE) 1 MG tablet   . [DISCONTINUED] methotrexate (RHEUMATREX) 2.5 MG tablet   . [DISCONTINUED] ranitidine (ZANTAC) 150 MG tablet Take 1 tablet (150 mg total) by mouth at bedtime. (Patient not taking: Reported on 10/14/2017)   No facility-administered encounter medications on file as of 02/17/2018.     Allergies  Allergen Reactions  . Prednisone Other (See Comments)    Makes him very mean    Review of Systems  Constitutional: Negative for fever and malaise/fatigue.  Respiratory: Negative for cough, shortness of breath and wheezing.   Cardiovascular: Negative for chest pain, palpitations, orthopnea, claudication and leg  swelling.  Genitourinary: Negative for frequency.  Endo/Heme/Allergies: Negative.  Negative for polydipsia.  Psychiatric/Behavioral: Negative.     Objective:  BP 130/86 (BP Location: Right Arm, Patient Position: Sitting, Cuff Size: Normal)   Pulse 72   Temp 98.1 F (36.7 C) (Oral)   Resp 16   Wt 220 lb (99.8 kg)   SpO2 98%   BMI 31.57 kg/m   Physical Exam  Constitutional: He is oriented to  person, place, and time and well-developed, well-nourished, and in no distress.  HENT:  Head: Normocephalic and atraumatic.  Eyes: Conjunctivae are normal.  Neck: No thyromegaly present.  Cardiovascular: Normal rate, regular rhythm and normal heart sounds.  Pulmonary/Chest: Effort normal and breath sounds normal.  Abdominal: Soft.  Neurological: He is alert and oriented to person, place, and time. Gait normal. GCS score is 15.  Skin: Skin is warm and dry.  Psychiatric: Mood, memory, affect and judgment normal.    Assessment and Plan :   1. Type 2 diabetes mellitus without complication, without long-term current use of insulin ([place]) Patient has no insurance.  Will consult CCM about helping with getting his medications. - Hemoglobin A1c  2. Essential hypertension   3. Elevated LFTs Almost certainly this is fatty liver. - Comprehensive metabolic panel  I have done the exam and reviewed the chart and it is accurate to the best of my knowledge. Development worker, community has been used and  any errors in dictation or transcription are unintentional. Miguel Aschoff M.D. [facility]

## 2018-02-18 LAB — COMPREHENSIVE METABOLIC PANEL
A/G RATIO: 2.1 (ref 1.2–2.2)
ALK PHOS: 60 IU/L (ref 39–117)
ALT: 41 IU/L (ref 0–44)
AST: 30 IU/L (ref 0–40)
Albumin: 4.8 g/dL (ref 3.8–4.8)
BILIRUBIN TOTAL: 0.4 mg/dL (ref 0.0–1.2)
BUN / CREAT RATIO: 23 (ref 10–24)
BUN: 23 mg/dL (ref 8–27)
CHLORIDE: 102 mmol/L (ref 96–106)
CO2: 19 mmol/L — ABNORMAL LOW (ref 20–29)
Calcium: 9.7 mg/dL (ref 8.6–10.2)
Creatinine, Ser: 0.98 mg/dL (ref 0.76–1.27)
GFR calc Af Amer: 95 mL/min/{1.73_m2} (ref >59)
GFR calc non Af Amer: 82 mL/min/{1.73_m2} (ref >59)
GLOBULIN, TOTAL: 2.3 g/dL (ref 1.5–4.5)
Glucose: 154 mg/dL — ABNORMAL HIGH (ref 65–99)
Potassium: 4.8 mmol/L (ref 3.5–5.2)
SODIUM: 137 mmol/L (ref 134–144)
Total Protein: 7.1 g/dL (ref 6.0–8.5)

## 2018-02-18 LAB — HEMOGLOBIN A1C
Est. average glucose Bld gHb Est-mCnc: 189 mg/dL
Hgb A1c MFr Bld: 8.2 % — ABNORMAL HIGH (ref 4.8–5.6)

## 2018-02-21 ENCOUNTER — Ambulatory Visit: Payer: Self-pay | Admitting: Pharmacist

## 2018-02-21 DIAGNOSIS — E119 Type 2 diabetes mellitus without complications: Secondary | ICD-10-CM

## 2018-02-21 DIAGNOSIS — E7849 Other hyperlipidemia: Secondary | ICD-10-CM

## 2018-02-21 DIAGNOSIS — I10 Essential (primary) hypertension: Secondary | ICD-10-CM

## 2018-02-21 NOTE — Chronic Care Management (AMB) (Signed)
  Chronic Care Management   Follow Up Note   02/21/2018 Name: Samuel Gentry MRN: [id number] DOB: 1955-02-24  Referred by: Maple Hudson., MD Reason for referral : Chronic Care Management (medication assistance Evaristo Bury))   Subjective: "I received the Evaristo Bury four month supply at my appointment last Thursday"  Assessment: Evaristo Bury four month supply shipment received at [facility], including pen needles. Have not received an approval letter from [employer]. Contacted [employer] representative. Samuel Gentry has been approved for [place] medication assistance (including pen needles) until February 10 2019. Dr. Sullivan Lone will have to send in refills via fax [phone]) using [employer] reorder form.   Goals Addressed            This Visit's Progress   . "I need help affording my medications" (pt-stated)   On track    Pharmacist Clinical Goal(s): Over the next 10 days, Samuel Gentry will provide the necessary supplementary documents (proof of out of pocket prescription expenditure, proof of household income) needed for medication assistance applications to CCM pharmacist.   Interventions:  CCM pharmacist will investigate different medication assistance foundations and programs for Samuel Gentry to apply for, primarily for Trulicity and [place].   Partially completed: 02/21/2018, patient has been approved for medication assistance for Evaristo Bury, expires 01 28 2021  Current Barriers:  . No prescription insurance coverage . Not able to use manufacturer's coupons due to lack of commercial coverage  Patient Self Care Activities:  . Collaborate with CCM pharmacist and provide necessary paperwork    Please see past updates related to this goal by clicking on the "Past Updates" button in the selected goal           The patient has been provided with contact information for the chronic care management team and has been advised to call with any health related questions or concerns.  Counseled  patient on refill process. CCM pharmacist will contact Samuel Gentry upon approval for other programs applied for: [employer] (Trulicity), [employer] C[employer] (Jardiance)   Karalee Height, PharmD Clinical Pharmacist [facility] [phone]

## 2018-02-22 ENCOUNTER — Other Ambulatory Visit: Payer: Self-pay | Admitting: Family Medicine

## 2018-02-22 DIAGNOSIS — E119 Type 2 diabetes mellitus without complications: Secondary | ICD-10-CM

## 2018-02-23 ENCOUNTER — Ambulatory Visit: Payer: Self-pay | Admitting: Pharmacist

## 2018-02-23 DIAGNOSIS — E119 Type 2 diabetes mellitus without complications: Secondary | ICD-10-CM

## 2018-02-23 DIAGNOSIS — I10 Essential (primary) hypertension: Secondary | ICD-10-CM

## 2018-02-23 NOTE — Chronic Care Management (AMB) (Signed)
  Chronic Care Management   Note  02/23/2018 Name: Samuel Gentry MRN: [id number] DOB: 1955/05/02   63 y.o. year old male referred to Chronic Care Management by Dr. Julieanne Mansonichard Gilbert for medication assistance (self pay). Last office visit with [name], [name], MD was 02/17/18.  Initial office visit with CCM pharmacist on 02/03/18.     Was unable to reach patient via telephone today and have left HIPAA compliant voicemail asking patient to return my call. (unsuccessful outreach #1).  Purpose of today's outreach: received incoming fax from [name] that patient was over income requirements for [facility] medication assistance program (medication: Jardiance). Letter has been uploaded to media tab.    Follow up plan: The CM team will reach out to the patient again over the next 7 days.   [name], PharmD Clinical Pharmacist [facility] [phone]

## 2018-02-28 ENCOUNTER — Ambulatory Visit: Payer: Self-pay | Admitting: Pharmacist

## 2018-02-28 DIAGNOSIS — E7849 Other hyperlipidemia: Secondary | ICD-10-CM

## 2018-02-28 DIAGNOSIS — E119 Type 2 diabetes mellitus without complications: Secondary | ICD-10-CM

## 2018-02-28 NOTE — Chronic Care Management (AMB) (Signed)
  Chronic Care Management   Follow Up Note   02/28/2018 Name: Samuel Gentry MRN: [id number] DOB: 1955-11-24  Referred by: Maple Hudson., MD Reason for referral : Chronic Care Management (med assistance (jardiance))   Subjective: Care Coordination with [employer] with respect to patient's denial letter from [facility] for medication assistance for Samuel Gentry. Spoke with representative [name].   Left HIPAA compliant voicemail for Mr. Samuel Gentry updating him on today's communications.  Assessment:   Mr. Samuel Gentry was denied for medication assistance for Jardiance, made by [employer] The reason states was "over income". The income requirements for Jardiance is 300% of [employer]. Mr. Samuel Gentry is self-pay (uninsured).   [name] recommends that the denial letter be faxed in for a redermination for Mr. Samuel Gentry. A new decision will be made within 2 weeks.   Goals Addressed            This Visit's Progress   . "I need help affording my medications" (pt-stated)   On track    Pharmacist Clinical Goal(s): Over the next 10 days, Mr.. Samuel Gentry will provide the necessary supplementary documents (proof of out of pocket prescription expenditure, proof of household income) needed for medication assistance applications to CCM pharmacist.   Interventions:  CCM pharmacist will investigate different medication assistance foundations and programs for Mr. Samuel Gentry to apply for, primarily for Trulicity and [place].   Partially completed: 02/21/2018, patient has been approved for medication assistance for Samuel Gentry, expires 01 28 2021  As of 02/28/18, patient was denied for Jardiance assistance [employer]) for "over income"; contacted [name], Technical brewer for [employer], who recommended sending in denial letter for redetermination. Decision will be made within 2 weeks    Current Barriers:  . No prescription insurance coverage . Not able to use manufacturer's coupons due to lack of commercial  coverage  Patient Self Care Activities:  . Collaborate with CCM pharmacist and provide necessary paperwork    Please see past updates related to this goal by clicking on the "Past Updates" button in the selected goal           The CM team will reach out to the patient again over the next 14 days.   Karalee Height, PharmD Clinical Pharmacist [facility] [phone]

## 2018-03-07 ENCOUNTER — Ambulatory Visit: Payer: Self-pay | Admitting: Pharmacist

## 2018-03-07 DIAGNOSIS — E7849 Other hyperlipidemia: Secondary | ICD-10-CM

## 2018-03-07 DIAGNOSIS — E119 Type 2 diabetes mellitus without complications: Secondary | ICD-10-CM

## 2018-03-07 NOTE — Patient Instructions (Signed)
Goals Addressed            This Visit's Progress   . "I need help affording my medications" (pt-stated)   On track    Pharmacist Clinical Goal(s): Over the next 10 days, [name] will provide the necessary supplementary documents (proof of out of pocket prescription expenditure, proof of household income) needed for medication assistance applications to CCM pharmacist.   Interventions:  CCM pharmacist will investigate different medication assistance foundations and programs for Samuel Gentry to apply for, primarily for Trulicity and [place].   Partially completed: 02/21/2018, patient has been approved for medication assistance for Samuel Gentry, expires 01 28 2021  As of 02/28/18, patient was denied for Jardiance assistance [employer]) for "over income"; contacted [name], Technical brewer for [employer], who recommended sending in denial letter for redetermination. Decision will be made within 2 weeks    Current Barriers:  . No prescription insurance coverage . Not able to use manufacturer's coupons due to lack of commercial coverage  Patient Self Care Activities:  . Collaborate with CCM pharmacist and provide necessary paperwork    Please see past updates related to this goal by clicking on the "Past Updates" button in the selected goal         The patient verbalized understanding of instructions provided today and declined a print copy of patient instruction materials.

## 2018-03-07 NOTE — Chronic Care Management (AMB) (Signed)
  Care Management   Note  03/07/2018 Name: JOSNIEL EKBERG MRN: [id number] DOB: 15-Feb-1955  Incoming call from Virgel Bouquet. HIPAA identifiers verified. Mr. Paller states that he has received a letter from [employer] stating his application (submitted 02/14/18) did not have appropriate financial documents.   Contacted [employer], spoke with Chief Executive Officer. Almira Coaster states that the paperwork submitted with application was the "signature page" but what is needed are the first two pages of the 1040 tax form.   Contacted Mr. Bielec. HIPAA identifiers verified. Mr. Feinstein will provide a copy of the first two pages of the 1040 form today. Received these papers at 11:00 AM and submitted to [facility] by 13:00.    Goals Addressed            This Visit's Progress   . "I need help affording my medications" (pt-stated)   On track    Pharmacist Clinical Goal(s): Over the next 10 days, Mr.. Lauritsen will provide the necessary supplementary documents (proof of out of pocket prescription expenditure, proof of household income) needed for medication assistance applications to CCM pharmacist.   Interventions:  CCM pharmacist will investigate different medication assistance foundations and programs for Mr. Bialecki to apply for, primarily for Trulicity and [place].   Partially completed: 02/21/2018, patient has been approved for medication assistance for Evaristo Bury, expires 01 28 2021  As of 02/28/18, patient was denied for Jardiance assistance [employer]) for "over income"; contacted [name], Technical brewer for [employer], who recommended sending in denial letter for redetermination. Decision will be made within 2 weeks    Current Barriers:  . No prescription insurance coverage . Not able to use manufacturer's coupons due to lack of commercial coverage  Patient Self Care Activities:  . Collaborate with CCM pharmacist and provide necessary paperwork    Please see past updates related to this goal by  clicking on the "Past Updates" button in the selected goal          Follow Up Plan: The CM team will reach out to the patient again over the next 14 days.    Karalee Height, PharmD Clinical Pharmacist [facility] [phone]

## 2018-03-12 ENCOUNTER — Encounter: Payer: Self-pay | Admitting: Family Medicine

## 2018-03-12 ENCOUNTER — Ambulatory Visit (INDEPENDENT_AMBULATORY_CARE_PROVIDER_SITE_OTHER): Payer: Self-pay | Admitting: Family Medicine

## 2018-03-12 VITALS — BP 122/80 | HR 72 | Temp 97.7°F | Resp 16 | Wt 225.6 lb

## 2018-03-12 DIAGNOSIS — M65342 Trigger finger, left ring finger: Secondary | ICD-10-CM

## 2018-03-12 NOTE — Patient Instructions (Signed)
Trigger Finger    Trigger finger (stenosing tenosynovitis) is a condition that causes a finger to get stuck in a bent position. Each finger has a tough, cord-like tissue that connects muscle to bone (tendon), and each tendon is surrounded by a tunnel of tissue (tendon sheath). To move your finger, your tendon needs to slide freely through the sheath. Trigger finger happens when the tendon or the sheath thickens, making it difficult to move your finger.  Trigger finger can affect any finger or a thumb. It may affect more than one finger. Mild cases may clear up with rest and medicine. Severe cases require more treatment.  What are the causes?  Trigger finger is caused by a thickened finger tendon or tendon sheath. The cause of this thickening is not known.  What increases the risk?  The following factors may make you more likely to develop this condition:   Doing activities that require a strong grip.   Having rheumatoid arthritis, gout, or diabetes.   Being 40-60 years old.   Being a woman.  What are the signs or symptoms?  Symptoms of this condition include:   Pain when bending or straightening your finger.   Tenderness or swelling where your finger attaches to the palm of your hand.   A lump in the palm of your hand or on the inside of your finger.   Hearing a popping sound when you try to straighten your finger.   Feeling a popping, catching, or locking sensation when you try to straighten your finger.   Being unable to straighten your finger.  How is this diagnosed?  This condition is diagnosed based on your symptoms and a physical exam.  How is this treated?  This condition may be treated by:   Resting your finger and avoiding activities that make symptoms worse.   Wearing a finger splint to keep your finger in a slightly bent position.   Taking NSAIDs to relieve pain and swelling.   Injecting medicine (steroids) into the tendon sheath to reduce swelling and irritation. Injections may need to be  repeated.   Having surgery to open the tendon sheath. This may be done if other treatments do not work and you cannot straighten your finger. You may need physical therapy after surgery.  Follow these instructions at home:     Use moist heat to help reduce pain and swelling as told by your health care provider.   Rest your finger and avoid activities that make pain worse. Return to normal activities as told by your health care provider.   If you have a splint, wear it as told by your health care provider.   Take over-the-counter and prescription medicines only as told by your health care provider.   Keep all follow-up visits as told by your health care provider. This is important.  Contact a health care provider if:   Your symptoms are not improving with home care.  Summary   Trigger finger (stenosing tenosynovitis) causes your finger to get stuck in a bent position, and it can make it difficult and painful to straighten your finger.   This condition develops when a finger tendon or tendon sheath thickens.   Treatment starts with resting, wearing a splint, and taking NSAIDs.   In severe cases, surgery to open the tendon sheath may be needed.  This information is not intended to replace advice given to you by your health care provider. Make sure you discuss any questions you have with your   health care provider.  Document Released: 10/19/2003 Document Revised: 12/10/2015 Document Reviewed: 12/10/2015  Elsevier Interactive Patient Education  2019 Elsevier Inc.

## 2018-03-12 NOTE — Progress Notes (Signed)
    Patient: Samuel Gentry Male    DOB: 05/21/1955   62 y.o.   MRN: [id number] Visit Date: 03/12/2018  Today's Provider: Angela Bacigalupo, MD   Chief Complaint  Patient presents with  . Hand Pain   Subjective:     Hand Pain    Patient comes in office today with complaint of trigger finger. Patient reports that he has had issue with his left ring finger for the past year now and has gradually been getting worse over the past 2 months. Patient reports pain and swelling and states previous treatment has been steroid injection.   Allergies  Allergen Reactions  . Prednisone Other (See Comments)    Makes him very mean     Current Outpatient Medications:  .  ALPRAZolam (XANAX) 0.5 MG tablet, Take 1 tablet by mouth every 8 (eight) hours as needed., Disp: , Rfl:  .  amLODipine (NORVASC) 5 MG tablet, TAKE 1 TABLET(5 MG) BY MOUTH DAILY, Disp: 90 tablet, Rfl: 3 .  aspirin 325 MG tablet, Take 1 tablet by mouth daily., Disp: , Rfl:  .  atenolol (TENORMIN) 50 MG tablet, Take 1 tablet (50 mg total) by mouth daily., Disp: 90 tablet, Rfl: 3 .  BD PEN NEEDLE NANO U/F 32G X 4 MM MISC, USE AS DIRECTED WITH TRESIBA, Disp: 100 each, Rfl: 11 .  Blood Glucose Monitoring Suppl (CONTOUR NEXT MONITOR) w/Device KIT, USE AS DIRECTED, Disp: 1 kit, Rfl: 0 .  diclofenac (VOLTAREN) 50 MG EC tablet, , Disp: , Rfl: 11 .  Dulaglutide (TRULICITY) 1.5 MG/0.5ML SOPN, Inject 1 pen into the skin once a week., Disp: 4 pen, Rfl: 11 .  glucose blood (CONTOUR NEXT TEST) test strip, To check blood sugar daily, Disp: 100 each, Rfl: 12 .  JARDIANCE 25 MG TABS tablet, TAKE 1 TABLET BY MOUTH EVERY DAY, Disp: 30 tablet, Rfl: 12 .  lisinopril (PRINIVIL,ZESTRIL) 20 MG tablet, TAKE 1 TABLET(20 MG) BY MOUTH DAILY, Disp: 90 tablet, Rfl: 0 .  metFORMIN (GLUCOPHAGE) 1000 MG tablet, Take 1 tablet (1,000 mg total) by mouth 2 (two) times daily with a meal., Disp: 60 tablet, Rfl: 12 .  MICROLET LANCETS MISC, USE AS DIRECTED ONCE DAILY,  Disp: 100 each, Rfl: 3 .  MULTIPLE VITAMINS PO, Take 1 tablet by mouth daily., Disp: , Rfl:  .  nitroGLYCERIN (NITROSTAT) 0.4 MG SL tablet, Place 0.4 mg under the tongue every 5 (five) minutes as needed., Disp: , Rfl:  .  omeprazole (PRILOSEC) 20 MG capsule, Take 1 capsule (20 mg total) by mouth 2 (two) times daily before a meal., Disp: 60 capsule, Rfl: 12 .  rosuvastatin (CRESTOR) 40 MG tablet, TAKE 1 TABLET(40 MG) BY MOUTH DAILY, Disp: 30 tablet, Rfl: 11 .  sertraline (ZOLOFT) 50 MG tablet, TAKE 1 TABLET(50 MG) BY MOUTH AT BEDTIME, Disp: 90 tablet, Rfl: 3 .  TRESIBA FLEXTOUCH 100 UNIT/ML SOPN FlexTouch Pen, INJECT UNDER THE SKIN UP TO 30 UNITS DAILY EVERY DAY AS DIRECTED, Disp: 12 pen, Rfl: 3  Review of Systems  Constitutional: Negative.   HENT: Negative.   Respiratory: Negative.   Cardiovascular: Negative.   Gastrointestinal: Negative.   Endocrine: Negative.   Genitourinary: Negative.   Musculoskeletal: Positive for arthralgias and joint swelling.  Skin: Negative.   Neurological: Negative.   Hematological: Negative.   Psychiatric/Behavioral: Negative.     Social History   Tobacco Use  . Smoking status: Former Smoker    Packs/day: 2.00    Years: 8.00      Pack years: 16.00    Types: Cigarettes    Last attempt to quit: 02/08/1982    Years since quitting: 36.1  . Smokeless tobacco: Never Used  Substance Use Topics  . Alcohol use: No      Objective:   There were no vitals taken for this visit. There were no vitals filed for this visit.   Physical Exam Vitals signs reviewed.  Constitutional:      General: He is not in acute distress.    Appearance: Normal appearance. He is not diaphoretic.  HENT:     Head: Normocephalic and atraumatic.  Eyes:     General: No scleral icterus.    Conjunctiva/sclera: Conjunctivae normal.  Cardiovascular:     Rate and Rhythm: Normal rate and regular rhythm.     Pulses: Normal pulses.     Heart sounds: Normal heart sounds. No murmur.    Pulmonary:     Effort: Pulmonary effort is normal. No respiratory distress.     Breath sounds: Normal breath sounds.  Musculoskeletal:     Left hand: He exhibits normal range of motion. Normal sensation noted. Normal strength noted.     Comments: TTP over thickening of flexor tendon sheath of L ring finger with some trigger on straightening after flexing finger  Skin:    General: Skin is warm and dry.     Capillary Refill: Capillary refill takes less than 2 seconds.     Findings: No rash.  Neurological:     Mental Status: He is alert and oriented to person, place, and time. Mental status is at baseline.  Psychiatric:        Mood and Affect: Mood normal.        Behavior: Behavior normal.         Assessment & Plan   1. Trigger ring finger of left hand - discussed trigger fingers and possible management strategies - as he has done well with steroid injection in the past, we agreed to repeat this today-see procedure note below -Discussed with patient icing and massage as well -Discussed return precautions   INJECTION: Patient was given informed consent,. Appropriate time out was taken. Area prepped and draped in usual sterile fashion. 0.5 cc of depo-medrol 40 mg/ml plus  0.5 cc of 1% lidocaine without epinephrine was injected into the dorsum of left hand ring finger flexor tendon sheath. The patient tolerated the procedure well. There were no complications. Post procedure instructions were given.    Return if symptoms worsen or fail to improve.   The entirety of the information documented in the History of Present Illness, Review of Systems and Physical Exam were personally obtained by me. Portions of this information were initially documented by Kat Wolford, CMA and reviewed by me for thoroughness and accuracy.    Bacigalupo, Angela M, MD, MPH [facility] 03/12/2018 9:57 AM  

## 2018-03-14 ENCOUNTER — Ambulatory Visit: Payer: Self-pay | Admitting: Pharmacist

## 2018-03-14 DIAGNOSIS — E119 Type 2 diabetes mellitus without complications: Secondary | ICD-10-CM

## 2018-03-14 DIAGNOSIS — I10 Essential (primary) hypertension: Secondary | ICD-10-CM

## 2018-03-14 NOTE — Patient Instructions (Signed)
Goals Addressed            This Visit's Progress   . "I need help affording my medications" (pt-stated)       Pharmacist Clinical Goal(s): Over the next 10 days, Mr.. Koopmann will provide the necessary supplementary documents (proof of out of pocket prescription expenditure, proof of household income) needed for medication assistance applications to CCM pharmacist.   Interventions:  CCM pharmacist will investigate different medication assistance foundations and programs for Mr. Amari to apply for, primarily for Trulicity and [place].   Partially completed: 02/21/2018, patient has been approved for medication assistance for Evaristo Bury, expires 01 28 2021  As of 02/28/18, patient was denied for Jardiance assistance [employer]) for "over income"; contacted [name], Technical brewer for [employer], who recommended sending in denial letter for redetermination. Decision will be made within 2 weeks   03/14/18: Update from [employer], representative [place] on 02/28/18 provided incorrect information, patient needs to supply a financial hardship letter and an out of pocket test claim from the patient's pharmacy.   Current Barriers:  . No prescription insurance coverage . Not able to use manufacturer's coupons due to lack of commercial coverage  Patient Self Care Activities:  . Collaborate with CCM pharmacist and provide necessary paperwork    Please see past updates related to this goal by clicking on the "Past Updates" button in the selected goal          The patient verbalized understanding of instructions provided today and declined a print copy of patient instruction materials.

## 2018-03-14 NOTE — Chronic Care Management (AMB) (Signed)
  Care Management   Note  03/14/2018 Name: KELDAN BOVAIRD MRN: [id number] DOB: [birth year]   Subjective: Mr. Monford Nasca is a 63 year old male patient of Dr. Julieanne Manson. Mr. Proa is a self-pay patient engaged with CCM clinical pharmacist for medication assistance. Follow up call today was placed to [employer] for redetermination decision (see note 2/17 for detail).   Assessment:  #[employer] (Jardiance): Representative spoke with today stated that information given by [name] at last telephone call 2/17 was incorrect. Mr. Fraleigh needs to submit a financial hardship letter and a test claim from pharmacy showing out of pocket cost. Representative provided me with the expedited fax line to use: [phone]  #[facility]: Spoke with pharmacist, patient out of pocket expenditure is $631.99 for 30 days supply  #Patient: Spoke with patient to update him on above information. Patient brought by letter of financial hardship and statement from [place]. Submitted expedited form to fax line above. Statements have been uploaded under media tab.    Goals Addressed            This Visit's Progress   . "I need help affording my medications" (pt-stated)       Pharmacist Clinical Goal(s): Over the next 10 days, Mr.. Subia will provide the necessary supplementary documents (proof of out of pocket prescription expenditure, proof of household income) needed for medication assistance applications to CCM pharmacist.   Interventions:  CCM pharmacist will investigate different medication assistance foundations and programs for Mr. Baechle to apply for, primarily for Tr[place] and [place].   Partially completed: 02/21/2018, patient has been approved for medication assistance for Evaristo Bury, expires 01 28 2021  As of 02/28/18, patient was denied for Jardiance assistance [employer]) for "over income"; contacted [name], Technical brewer for [employer], who recommended sending in denial letter for  redetermination. Decision will be made within 2 weeks   03/14/18: Update from [employer], representative [place] on 02/28/18 provided incorrect information, patient needs to supply a financial hardship letter and an out of pocket test claim from the patient's pharmacy.   Current Barriers:  . No prescription insurance coverage . Not able to use manufacturer's coupons due to lack of commercial coverage  Patient Self Care Activities:  . Collaborate with CCM pharmacist and provide necessary paperwork    Please see past updates related to this goal by clicking on the "Past Updates" button in the selected goal          Follow Up Plan: Telephone follow up appointment with CCM team member scheduled for: 7 days The patient has been provided with contact information for the chronic care management team and has been advised to call with any health related questions or concerns.   Karalee Height, PharmD Clinical Pharmacist [facility] [phone]

## 2018-04-11 ENCOUNTER — Other Ambulatory Visit: Payer: Self-pay | Admitting: Family Medicine

## 2018-04-11 DIAGNOSIS — E119 Type 2 diabetes mellitus without complications: Secondary | ICD-10-CM

## 2018-05-23 ENCOUNTER — Telehealth: Payer: Self-pay

## 2018-05-23 NOTE — Telephone Encounter (Signed)
Patients wife ( who prefers to be called "Happy") called office stating that she has concerns that patient has been off Jardiance since February due to cost. Patients wife is wanting to know if Dr. Sullivan Lone can change prescription to something cheaper? Please advise. KW

## 2018-06-03 NOTE — Telephone Encounter (Signed)
Dr. Elisabeth Cara patient please advise.

## 2018-06-03 NOTE — Telephone Encounter (Signed)
Wife states she has not heard anything back from [place] and wants to know if it is ok for him to be off the medication this long.  CB#  [phone]  Thank Barth Kirks

## 2018-06-03 NOTE — Telephone Encounter (Signed)
Have samples of Jardiance 25 mg one tablet daily to help cover the need for medication until Dr. Sullivan Lone is back in town. From his chart record, looks like he is due for follow up of labs - especially Hgb A1C.

## 2018-06-07 ENCOUNTER — Other Ambulatory Visit: Payer: Self-pay | Admitting: Family Medicine

## 2018-06-07 DIAGNOSIS — E119 Type 2 diabetes mellitus without complications: Secondary | ICD-10-CM

## 2018-06-07 NOTE — Telephone Encounter (Signed)
Pt's wife advised.  Samples are at the front desk.   Thanks,   -Vernona Rieger

## 2018-06-13 ENCOUNTER — Other Ambulatory Visit: Payer: Self-pay

## 2018-06-13 ENCOUNTER — Ambulatory Visit (INDEPENDENT_AMBULATORY_CARE_PROVIDER_SITE_OTHER): Payer: Self-pay | Admitting: Family Medicine

## 2018-06-13 ENCOUNTER — Encounter: Payer: Self-pay | Admitting: Family Medicine

## 2018-06-13 VITALS — BP 100/60 | HR 75 | Temp 97.7°F | Resp 16 | Wt 215.0 lb

## 2018-06-13 DIAGNOSIS — R51 Headache: Secondary | ICD-10-CM

## 2018-06-13 DIAGNOSIS — I2510 Atherosclerotic heart disease of native coronary artery without angina pectoris: Secondary | ICD-10-CM

## 2018-06-13 DIAGNOSIS — R519 Headache, unspecified: Secondary | ICD-10-CM

## 2018-06-13 DIAGNOSIS — R5383 Other fatigue: Secondary | ICD-10-CM

## 2018-06-13 DIAGNOSIS — E119 Type 2 diabetes mellitus without complications: Secondary | ICD-10-CM

## 2018-06-13 MED ORDER — KETOROLAC TROMETHAMINE 60 MG/2ML IM SOLN
60.0000 mg | Freq: Once | INTRAMUSCULAR | Status: AC
  Administered 2018-06-13: 60 mg via INTRAMUSCULAR

## 2018-06-13 MED ORDER — NAPROXEN 500 MG PO TABS: 500.0000 mg | ORAL_TABLET | Freq: Two times a day (BID) | ORAL | 0 refills | Status: DC

## 2018-06-13 NOTE — Progress Notes (Signed)
Patient: Samuel Gentry Male    DOB: 1955/10/11   63 y.o.   MRN: [id number] Visit Date: 06/13/2018  Today's Provider: [name], MD   Chief Complaint  Patient presents with  . Headache   Subjective:     Headache   This is a new problem. The current episode started 1 to 4 weeks ago. The problem occurs constantly. The problem has been unchanged. The pain is located in the bilateral (more so on right side) region. Radiates to: top of head. The pain quality is similar to prior headaches (patient reports 65yr ago he had similar like headaches that put him in the hospital). The quality of the pain is described as aching and throbbing. Associated symptoms include dizziness, eye pain (right), a loss of balance and nausea. Pertinent negatives include no abdominal pain, abnormal behavior, anorexia, back pain, blurred vision, coughing, drainage, ear pain, eye redness, eye watering, facial sweating, fever, hearing loss, insomnia, muscle aches, neck pain, numbness, phonophobia, photophobia, rhinorrhea, scalp tenderness, seizures, sinus pressure, sore throat, swollen glands, tingling, tinnitus, visual change, vomiting, weakness or weight loss. The symptoms are aggravated by activity. He has tried Excedrin and acetaminophen for the symptoms. The treatment provided no relief. His past medical history is significant for hypertension.    Allergies  Allergen Reactions  . Prednisone Other (See Comments)    Makes him very mean     Current Outpatient Medications:  .  ALPRAZolam (XANAX) 0.5 MG tablet, Take 1 tablet by mouth every 8 (eight) hours as needed., Disp: , Rfl:  .  amLODipine (NORVASC) 5 MG tablet, TAKE 1 TABLET(5 MG) BY MOUTH DAILY, Disp: 90 tablet, Rfl: 3 .  aspirin 325 MG tablet, Take 1 tablet by mouth daily., Disp: , Rfl:  .  atenolol (TENORMIN) 50 MG tablet, Take 1 tablet (50 mg total) by mouth daily., Disp: 90 tablet, Rfl: 3 .  BD PEN NEEDLE NANO U/F 32G X 4 MM MISC, USE AS  DIRECTED WITH TRESIBA, Disp: 100 each, Rfl: 11 .  Blood Glucose Monitoring Suppl (CONTOUR NEXT MONITOR) w/Device KIT, USE AS DIRECTED, Disp: 1 kit, Rfl: 0 .  CONTOUR NEXT TEST test strip, CHECK SUGAR ONCE DAILY AS DIRECTED, Disp: 100 each, Rfl: 5 .  diclofenac (VOLTAREN) 50 MG EC tablet, , Disp: , Rfl: 11 .  Dulaglutide (TRULICITY) 1.5 MDB/5.2CESOPN, Inject 1 pen into the skin once a week., Disp: 4 pen, Rfl: 11 .  JARDIANCE 25 MG TABS tablet, TAKE 1 TABLET BY MOUTH EVERY DAY, Disp: 30 tablet, Rfl: 12 .  lisinopril (PRINIVIL,ZESTRIL) 20 MG tablet, TAKE 1 TABLET(20 MG) BY MOUTH DAILY, Disp: 90 tablet, Rfl: 4 .  metFORMIN (GLUCOPHAGE) 1000 MG tablet, TAKE 1 TABLET(1000 MG) BY MOUTH TWICE DAILY WITH A MEAL, Disp: 180 tablet, Rfl: 4 .  MICROLET LANCETS MISC, USE AS DIRECTED ONCE DAILY, Disp: 100 each, Rfl: 3 .  MULTIPLE VITAMINS PO, Take 1 tablet by mouth daily., Disp: , Rfl:  .  nitroGLYCERIN (NITROSTAT) 0.4 MG SL tablet, Place 0.4 mg under the tongue every 5 (five) minutes as needed., Disp: , Rfl:  .  omeprazole (PRILOSEC) 20 MG capsule, Take 1 capsule (20 mg total) by mouth 2 (two) times daily before a meal., Disp: 60 capsule, Rfl: 12 .  rosuvastatin (CRESTOR) 40 MG tablet, TAKE 1 TABLET(40 MG) BY MOUTH DAILY, Disp: 30 tablet, Rfl: 11 .  sertraline (ZOLOFT) 50 MG tablet, TAKE 1 TABLET(50 MG) BY MOUTH AT BEDTIME, Disp:  90 tablet, Rfl: 3 .  TRESIBA FLEXTOUCH 100 UNIT/ML SOPN FlexTouch Pen, INJECT UNDER THE SKIN UP TO 30 UNITS DAILY EVERY DAY AS DIRECTED, Disp: 12 pen, Rfl: 3  Review of Systems  Constitutional: Negative for fever and weight loss.  HENT: Negative for ear pain, hearing loss, rhinorrhea, sinus pressure, sore throat and tinnitus.   Eyes: Positive for pain (right). Negative for blurred vision, photophobia and redness.  Respiratory: Negative for cough.   Gastrointestinal: Positive for nausea. Negative for abdominal pain, anorexia and vomiting.  Musculoskeletal: Negative for back pain and  neck pain.  Allergic/Immunologic: Negative.   Neurological: Positive for dizziness, headaches and loss of balance. Negative for tingling, seizures, weakness and numbness.  Psychiatric/Behavioral: Negative.  The patient does not have insomnia.   No trauma at all.  Social History   Tobacco Use  . Smoking status: Former Smoker    Packs/day: 2.00    Years: 8.00    Pack years: 16.00    Types: Cigarettes    Last attempt to quit: 02/08/1982    Years since quitting: 36.3  . Smokeless tobacco: Never Used  Substance Use Topics  . Alcohol use: No      Objective:   BP 100/60   Pulse 75   Temp 97.7 F (36.5 C) (Oral)   Resp 16   Wt 215 lb (97.5 kg)   SpO2 97%   BMI 30.85 kg/m  Vitals:   06/13/18 1418  BP: 100/60  Pulse: 75  Resp: 16  Temp: 97.7 F (36.5 C)  TempSrc: Oral  SpO2: 97%  Weight: 215 lb (97.5 kg)     Physical Exam Vitals signs reviewed.  Constitutional:      Appearance: He is well-developed.  HENT:     Head: Normocephalic and atraumatic.  Cardiovascular:     Rate and Rhythm: Normal rate and regular rhythm.  Pulmonary:     Breath sounds: Normal breath sounds.  Abdominal:     Palpations: Abdomen is soft.  Neurological:     Mental Status: He is alert and oriented to person, place, and time. Mental status is at baseline.  Psychiatric:        Mood and Affect: Mood normal.        Speech: Speech normal.        Behavior: Behavior normal.         Assessment & Plan    1. Type 2 diabetes mellitus without complication, without long-term current use of insulin (HCC)  - Comprehensive metabolic panel - Hemoglobin A1c  2. CAD in native artery  - Sed Rate (ESR)  3. Fatigue, unspecified type Check labs and obtain sed rate to rule out temporal arteritis. - CBC with Differential/Platelet - Comprehensive metabolic panel - Sed Rate (ESR)  4. Nonintractable headache, unspecified chronicity pattern, unspecified headache type Was going to use prednisone but  in the past patient has had significant reaction to this.  He gets very irritable.  We will try Toradol followed by naproxen.  We will see him back in 1 to 2 weeks.  May need imaging.  There is no focal deficit or any signs today that would indicate imaging is needed today. - ketorolac (TORADOL) injection 60 mg - naproxen (NAPROSYN) 500 MG tablet; Take 1 tablet (500 mg total) by mouth 2 (two) times daily with a meal.  Dispense: 30 tablet; Refill: 0      Cranford Mon, MD  [facility] as a scribe  for [name], MD.,have documented all relevant documentation on the behalf of [name], MD,as directed by  [name], MD while in the presence of [name], MD.

## 2018-06-13 NOTE — Patient Instructions (Signed)
Speak with wife about snoring, sleep patterns and signs of symptoms of sleep apnea with you that she has noticed.

## 2018-06-14 ENCOUNTER — Ambulatory Visit: Payer: Self-pay | Admitting: Family Medicine

## 2018-06-14 ENCOUNTER — Other Ambulatory Visit: Payer: Self-pay

## 2018-06-14 LAB — HEMOGLOBIN A1C
Est. average glucose Bld gHb Est-mCnc: 217 mg/dL
Hgb A1c MFr Bld: 9.2 % — ABNORMAL HIGH (ref 4.8–5.6)

## 2018-06-14 LAB — COMPREHENSIVE METABOLIC PANEL
ALT: 53 IU/L — ABNORMAL HIGH (ref 0–44)
AST: 34 IU/L (ref 0–40)
Albumin/Globulin Ratio: 2.1 (ref 1.2–2.2)
Albumin: 4.9 g/dL — ABNORMAL HIGH (ref 3.8–4.8)
Alkaline Phosphatase: 60 IU/L (ref 39–117)
BUN/Creatinine Ratio: 20 (ref 10–24)
BUN: 27 mg/dL (ref 8–27)
Bilirubin Total: 0.3 mg/dL (ref 0.0–1.2)
CO2: 19 mmol/L — ABNORMAL LOW (ref 20–29)
Calcium: 9.7 mg/dL (ref 8.6–10.2)
Chloride: 105 mmol/L (ref 96–106)
Creatinine, Ser: 1.38 mg/dL — ABNORMAL HIGH (ref 0.76–1.27)
GFR calc Af Amer: 63 mL/min/{1.73_m2} (ref >59)
GFR calc non Af Amer: 54 mL/min/{1.73_m2} — ABNORMAL LOW (ref >59)
Globulin, Total: 2.3 g/dL (ref 1.5–4.5)
Glucose: 150 mg/dL — ABNORMAL HIGH (ref 65–99)
Potassium: 5.1 mmol/L (ref 3.5–5.2)
Sodium: 139 mmol/L (ref 134–144)
Total Protein: 7.2 g/dL (ref 6.0–8.5)

## 2018-06-14 LAB — SEDIMENTATION RATE: Sed Rate: 8 mm/hr (ref 0–30)

## 2018-06-14 LAB — CBC WITH DIFFERENTIAL/PLATELET
Basophils Absolute: 0.1 10*3/uL (ref 0.0–0.2)
Basos: 1 %
EOS (ABSOLUTE): 0.2 10*3/uL (ref 0.0–0.4)
Eos: 3 %
Hematocrit: 41.2 % (ref 37.5–51.0)
Hemoglobin: 13.9 g/dL (ref 13.0–17.7)
Immature Grans (Abs): 0.1 10*3/uL (ref 0.0–0.1)
Immature Granulocytes: 1 %
Lymphocytes Absolute: 1.9 10*3/uL (ref 0.7–3.1)
Lymphs: 31 %
MCH: 30.1 pg (ref 26.6–33.0)
MCHC: 33.7 g/dL (ref 31.5–35.7)
MCV: 89 fL (ref 79–97)
Monocytes Absolute: 0.4 10*3/uL (ref 0.1–0.9)
Monocytes: 7 %
Neutrophils Absolute: 3.4 10*3/uL (ref 1.4–7.0)
Neutrophils: 57 %
Platelets: 217 10*3/uL (ref 150–450)
RBC: 4.62 x10E6/uL (ref 4.14–5.80)
RDW: 13.4 % (ref 11.6–15.4)
WBC: 6 10*3/uL (ref 3.4–10.8)

## 2018-06-20 ENCOUNTER — Ambulatory Visit: Payer: Self-pay | Admitting: Pharmacist

## 2018-06-20 NOTE — Chronic Care Management (AMB) (Signed)
   Care Management   Follow Up Note   06/20/2018 Name: Samuel Gentry MRN: [id number] DOB: Jul 24, 1955  Referred by: Jerrol Banana., MD Reason for referral : Care Coordination (medication refill)   Samuel Gentry is a 63 y.o. year old male who is a primary care patient of Jerrol Banana., MD. The care management pharmacist was consulted to assist with medication assistance.    Review of patient status, including review of consultants reports, relevant laboratory and other test results, and collaboration with appropriate care team members and the patient's provider was performed as part of comprehensive patient evaluation and provision of chronic care management services.    Goals Addressed            This Visit's Progress   . "I need help affording my medications" (pt-stated)       Pharmacist Clinical Goal(s): Over the next 10 days, [name] will provide the necessary supplementary documents (proof of out of pocket prescription expenditure, proof of household income) needed for medication assistance applications to CCM pharmacist.   Interventions:  CCM pharmacist will investigate different medication assistance foundations and programs for [name] to apply for, primarily for [place] and [place].   Partially completed: 02/21/2018, patient has been approved for medication assistance for [place] and [place], expires 01 28 2021  Updated 06/20/18: CCM pharmacist completed refill, copy uploaded under media tab.  As of 02/28/18, patient was denied for [place] assistance [facility]) for "over income"; contacted Marjory Lies, Wellsite geologist for [employer], who recommended sending in denial letter for redetermination. Decision will be made within 2 weeks   03/14/18: Update from [employer], representative [place] on 02/28/18 provided incorrect information, patient needs to supply a financial hardship letter and an out of pocket test claim from the patient's pharmacy.    Current Barriers:  . No prescription insurance coverage . Not able to use manufacturer's coupons due to lack of commercial coverage  Patient Self Care Activities:  . Collaborate with CCM pharmacist and provide necessary paperwork    Please see past updates related to this goal by clicking on the "Past Updates" button in the selected goal           The patient has been provided with contact information for the care management team and has been advised to call with any health related questions or concerns.  The care management team is available to follow up with the patient after provider conversation with the patient regarding recommendation for care management engagement and subsequent re-referral to the care management team.    Ruben Reason, PharmD Clinical Pharmacist [place] [phone]

## 2018-06-22 ENCOUNTER — Ambulatory Visit (INDEPENDENT_AMBULATORY_CARE_PROVIDER_SITE_OTHER): Payer: Self-pay | Admitting: Family Medicine

## 2018-06-22 ENCOUNTER — Other Ambulatory Visit: Payer: Self-pay | Admitting: Family Medicine

## 2018-06-22 ENCOUNTER — Other Ambulatory Visit: Payer: Self-pay

## 2018-06-22 ENCOUNTER — Encounter: Payer: Self-pay | Admitting: Family Medicine

## 2018-06-22 VITALS — BP 104/62 | HR 65 | Temp 97.5°F | Resp 16 | Wt 214.8 lb

## 2018-06-22 DIAGNOSIS — I10 Essential (primary) hypertension: Secondary | ICD-10-CM

## 2018-06-22 DIAGNOSIS — I25118 Atherosclerotic heart disease of native coronary artery with other forms of angina pectoris: Secondary | ICD-10-CM

## 2018-06-22 DIAGNOSIS — E1159 Type 2 diabetes mellitus with other circulatory complications: Secondary | ICD-10-CM

## 2018-06-22 DIAGNOSIS — R51 Headache: Secondary | ICD-10-CM

## 2018-06-22 DIAGNOSIS — R519 Headache, unspecified: Secondary | ICD-10-CM

## 2018-06-22 MED ORDER — CELECOXIB 200 MG PO CAPS: 200.0000 mg | ORAL_CAPSULE | Freq: Every day | ORAL | 1 refills | Status: DC | PRN

## 2018-06-22 MED ORDER — BUTALBITAL-APAP-CAFFEINE 50-325-40 MG PO TABS: 1.0000 | ORAL_TABLET | Freq: Three times a day (TID) | ORAL | 3 refills | Status: AC | PRN

## 2018-06-22 MED ORDER — KETOROLAC TROMETHAMINE 60 MG/2ML IM SOLN
60.0000 mg | Freq: Once | INTRAMUSCULAR | Status: AC
  Administered 2018-06-22: 60 mg via INTRAMUSCULAR

## 2018-06-22 MED ORDER — BUTALBITAL-APAP-CAFFEINE 50-325-40 MG PO TABS: 1.0000 | ORAL_TABLET | Freq: Three times a day (TID) | ORAL | 3 refills | Status: DC | PRN

## 2018-06-22 NOTE — Progress Notes (Signed)
Patient: Samuel Gentry Male    DOB: [birth year]   63 y.o.   MRN: [id number] Visit Date: 06/22/2018  Today's Provider: [name], MD   Chief Complaint  Patient presents with  . Follow-up   Subjective:     HPI  Follow up for Nonintractable headache  The patient was last seen for this 1 weeks ago. Changes made at last visit include administiring Torodal 70m in office and prescribing Naproxen 5014mHeaches are temporal and occipital No neurologic symptoms. He reports excellent compliance with treatment. He feels that condition is Unchanged. He is not having side effects.  Patient reports today that he woke up this morning with headache which he describes is all over his head. Patient describes pain as ache and constant; patient denies blurred vision, nausea or vomiting.   ------------------------------------------------------------------------------------  Allergies  Allergen Reactions  . Prednisone Other (See Comments)    Makes him very mean     Current Outpatient Medications:  .  ALPRAZolam (XANAX) 0.5 MG tablet, Take 1 tablet by mouth every 8 (eight) hours as needed., Disp: , Rfl:  .  amLODipine (NORVASC) 5 MG tablet, TAKE 1 TABLET(5 MG) BY MOUTH DAILY, Disp: 90 tablet, Rfl: 3 .  aspirin 325 MG tablet, Take 1 tablet by mouth daily., Disp: , Rfl:  .  atenolol (TENORMIN) 50 MG tablet, Take 1 tablet (50 mg total) by mouth daily., Disp: 90 tablet, Rfl: 3 .  BD PEN NEEDLE NANO U/F 32G X 4 MM MISC, USE AS DIRECTED WITH TRESIBA, Disp: 100 each, Rfl: 11 .  Blood Glucose Monitoring Suppl (CONTOUR NEXT MONITOR) w/Device KIT, USE AS DIRECTED, Disp: 1 kit, Rfl: 0 .  CONTOUR NEXT TEST test strip, CHECK SUGAR ONCE DAILY AS DIRECTED, Disp: 100 each, Rfl: 5 .  diclofenac (VOLTAREN) 50 MG EC tablet, , Disp: , Rfl: 11 .  Dulaglutide (TRULICITY) 1.5 MGSV/7.7LTOPN, Inject 1 pen into the skin once a week., Disp: 4 pen, Rfl: 11 .  JARDIANCE 25 MG TABS tablet, TAKE 1 TABLET BY  MOUTH EVERY DAY, Disp: 30 tablet, Rfl: 12 .  lisinopril (PRINIVIL,ZESTRIL) 20 MG tablet, TAKE 1 TABLET(20 MG) BY MOUTH DAILY, Disp: 90 tablet, Rfl: 4 .  metFORMIN (GLUCOPHAGE) 1000 MG tablet, TAKE 1 TABLET(1000 MG) BY MOUTH TWICE DAILY WITH A MEAL, Disp: 180 tablet, Rfl: 4 .  MICROLET LANCETS MISC, USE AS DIRECTED ONCE DAILY, Disp: 100 each, Rfl: 3 .  MULTIPLE VITAMINS PO, Take 1 tablet by mouth daily., Disp: , Rfl:  .  naproxen (NAPROSYN) 500 MG tablet, Take 1 tablet (500 mg total) by mouth 2 (two) times daily with a meal., Disp: 30 tablet, Rfl: 0 .  nitroGLYCERIN (NITROSTAT) 0.4 MG SL tablet, Place 0.4 mg under the tongue every 5 (five) minutes as needed., Disp: , Rfl:  .  omeprazole (PRILOSEC) 20 MG capsule, Take 1 capsule (20 mg total) by mouth 2 (two) times daily before a meal., Disp: 60 capsule, Rfl: 12 .  rosuvastatin (CRESTOR) 40 MG tablet, TAKE 1 TABLET(40 MG) BY MOUTH DAILY, Disp: 30 tablet, Rfl: 11 .  sertraline (ZOLOFT) 50 MG tablet, TAKE 1 TABLET(50 MG) BY MOUTH AT BEDTIME, Disp: 90 tablet, Rfl: 3 .  TRESIBA FLEXTOUCH 100 UNIT/ML SOPN FlexTouch Pen, INJECT UNDER THE SKIN UP TO 30 UNITS DAILY EVERY DAY AS DIRECTED, Disp: 12 pen, Rfl: 3  Review of Systems  Constitutional: Negative.   HENT: Negative.   Respiratory: Negative.   Cardiovascular: Negative.  Gastrointestinal: Negative.   Genitourinary: Negative.   Musculoskeletal: Negative.   Neurological: Positive for headaches. Negative for dizziness, syncope, light-headedness and numbness.    Social History   Tobacco Use  . Smoking status: Former Smoker    Packs/day: 2.00    Years: 8.00    Pack years: 16.00    Types: Cigarettes    Last attempt to quit: 02/08/1982    Years since quitting: 36.3  . Smokeless tobacco: Never Used  Substance Use Topics  . Alcohol use: No      Objective:   BP 104/62   Pulse 65   Temp (!) 97.5 F (36.4 C) (Oral)   Resp 16   Wt 214 lb 12.8 oz (97.4 kg)   SpO2 96%   BMI 30.82 kg/m   Vitals:   06/22/18 1616  BP: 104/62  Pulse: 65  Resp: 16  Temp: (!) 97.5 F (36.4 C)  TempSrc: Oral  SpO2: 96%  Weight: 214 lb 12.8 oz (97.4 kg)     Physical Exam Vitals signs reviewed.  Constitutional:      Appearance: He is well-developed.  HENT:     Head: Normocephalic and atraumatic.  Cardiovascular:     Rate and Rhythm: Normal rate and regular rhythm.  Pulmonary:     Breath sounds: Normal breath sounds.  Abdominal:     Palpations: Abdomen is soft.  Skin:    General: Skin is warm and dry.  Neurological:     General: No focal deficit present.     Mental Status: He is alert and oriented to person, place, and time. Mental status is at baseline.  Psychiatric:        Mood and Affect: Mood normal.        Speech: Speech normal.        Behavior: Behavior normal.        Thought Content: Thought content normal.         Assessment & Plan    1. Nonintractable headache, unspecified chronicity pattern, unspecified headache type Stop naproxen. Give toradol as he got some relief last week. May need Neurology referral. Or imaging. Could be occipital neuralgia. - ketorolac (TORADOL) injection 60 mg  2. Coronary artery disease of native artery of native heart with stable angina pectoris ([place]) All risk factors treated.  3. Essential hypertension Controlled.  4. Type 2 diabetes mellitus with other circulatory complication, without long-term current use of insulin ([place]) Meds per CCM.     Samuel Cranford Mon, MD  [employer] Samuel Gentry,acting as a scribe for [name], MD.,have documented all relevant documentation on the behalf of [name], MD,as directed by  [name], MD while in the presence of [name], MD.

## 2018-06-22 NOTE — Patient Instructions (Signed)
Stop Naproxen and start Celebrex 200mg  a day.

## 2018-07-04 ENCOUNTER — Other Ambulatory Visit: Payer: Self-pay | Admitting: Internal Medicine

## 2018-07-04 DIAGNOSIS — G44001 Cluster headache syndrome, unspecified, intractable: Secondary | ICD-10-CM

## 2018-07-04 DIAGNOSIS — R519 Headache, unspecified: Secondary | ICD-10-CM

## 2018-07-04 DIAGNOSIS — I2510 Atherosclerotic heart disease of native coronary artery without angina pectoris: Secondary | ICD-10-CM | POA: Insufficient documentation

## 2018-07-05 ENCOUNTER — Ambulatory Visit: Payer: 59

## 2018-07-06 ENCOUNTER — Telehealth: Payer: Self-pay | Admitting: Family Medicine

## 2018-07-06 NOTE — Telephone Encounter (Signed)
Depends on when his other appts are with specialists.

## 2018-07-06 NOTE — Telephone Encounter (Signed)
Pt has had a 2nd opinion on headaches due to a recent episode of head and neck hurting and winded. Pt is now having tests done with Dr Sabra Heck and Neurologist Julaine Hua.  Not sure he should still come to the appt in July.  Please advise.  Thanks, [employer]

## 2018-07-07 DIAGNOSIS — I10 Essential (primary) hypertension: Secondary | ICD-10-CM | POA: Insufficient documentation

## 2018-07-07 DIAGNOSIS — I200 Unstable angina: Secondary | ICD-10-CM | POA: Diagnosis present

## 2018-07-08 ENCOUNTER — Other Ambulatory Visit: Payer: Self-pay

## 2018-07-08 ENCOUNTER — Other Ambulatory Visit
Admission: RE | Admit: 2018-07-08 | Discharge: 2018-07-08 | Disposition: A | Discharge status: Home / Self Care | Payer: HRSA Program | Source: Ambulatory Visit | Attending: Internal Medicine | Admitting: Internal Medicine

## 2018-07-08 DIAGNOSIS — Z1159 Encounter for screening for other viral diseases: Secondary | ICD-10-CM | POA: Diagnosis present

## 2018-07-08 NOTE — Telephone Encounter (Signed)
Left message to call back  

## 2018-07-09 LAB — NOVEL CORONAVIRUS, NAA (HOSP ORDER, SEND-OUT TO REF LAB; TAT 18-24 HRS): SARS-CoV-2, NAA: NOT DETECTED

## 2018-07-12 ENCOUNTER — Ambulatory Visit
Admission: RE | Admit: 2018-07-12 | Discharge: 2018-07-12 | Disposition: A | Discharge status: Home / Self Care | Payer: Self-pay | Attending: Internal Medicine | Admitting: Internal Medicine

## 2018-07-12 ENCOUNTER — Other Ambulatory Visit: Payer: Self-pay

## 2018-07-12 ENCOUNTER — Encounter
Admission: RE | Disposition: A | Discharge status: Home / Self Care | Payer: Self-pay | Source: Home / Self Care | Attending: Internal Medicine

## 2018-07-12 ENCOUNTER — Other Ambulatory Visit: Payer: Self-pay | Admitting: Family Medicine

## 2018-07-12 DIAGNOSIS — E119 Type 2 diabetes mellitus without complications: Secondary | ICD-10-CM | POA: Insufficient documentation

## 2018-07-12 DIAGNOSIS — Z794 Long term (current) use of insulin: Secondary | ICD-10-CM | POA: Insufficient documentation

## 2018-07-12 DIAGNOSIS — Z87891 Personal history of nicotine dependence: Secondary | ICD-10-CM | POA: Insufficient documentation

## 2018-07-12 DIAGNOSIS — I25119 Atherosclerotic heart disease of native coronary artery with unspecified angina pectoris: Secondary | ICD-10-CM | POA: Insufficient documentation

## 2018-07-12 DIAGNOSIS — K219 Gastro-esophageal reflux disease without esophagitis: Secondary | ICD-10-CM | POA: Insufficient documentation

## 2018-07-12 DIAGNOSIS — I119 Hypertensive heart disease without heart failure: Secondary | ICD-10-CM | POA: Insufficient documentation

## 2018-07-12 DIAGNOSIS — M1990 Unspecified osteoarthritis, unspecified site: Secondary | ICD-10-CM | POA: Insufficient documentation

## 2018-07-12 DIAGNOSIS — E782 Mixed hyperlipidemia: Secondary | ICD-10-CM | POA: Insufficient documentation

## 2018-07-12 DIAGNOSIS — R0602 Shortness of breath: Secondary | ICD-10-CM | POA: Insufficient documentation

## 2018-07-12 DIAGNOSIS — Z888 Allergy status to other drugs, medicaments and biological substances status: Secondary | ICD-10-CM | POA: Insufficient documentation

## 2018-07-12 DIAGNOSIS — I200 Unstable angina: Secondary | ICD-10-CM | POA: Diagnosis present

## 2018-07-12 DIAGNOSIS — Z7982 Long term (current) use of aspirin: Secondary | ICD-10-CM | POA: Insufficient documentation

## 2018-07-12 HISTORY — PX: LEFT HEART CATH AND CORONARY ANGIOGRAPHY: CATH118249

## 2018-07-12 LAB — GLUCOSE, CAPILLARY
Glucose-Capillary: 207 mg/dL — ABNORMAL HIGH (ref 70–99)
Glucose-Capillary: 212 mg/dL — ABNORMAL HIGH (ref 70–99)

## 2018-07-12 SURGERY — LEFT HEART CATH AND CORONARY ANGIOGRAPHY
Anesthesia: Moderate Sedation | Laterality: Left

## 2018-07-12 MED ORDER — HEPARIN (PORCINE) IN NACL 1000-0.9 UT/500ML-% IV SOLN
INTRAVENOUS | Status: DC | PRN
  Administered 2018-07-12: 500 mL

## 2018-07-12 MED ORDER — SODIUM CHLORIDE 0.9 % WEIGHT BASED INFUSION: 1.0000 mL/kg/h | INTRAVENOUS | Status: DC

## 2018-07-12 MED ORDER — MIDAZOLAM HCL 2 MG/2ML IJ SOLN
INTRAMUSCULAR | Status: DC | PRN
  Administered 2018-07-12: 1 mg via INTRAVENOUS

## 2018-07-12 MED ORDER — HEPARIN (PORCINE) IN NACL 1000-0.9 UT/500ML-% IV SOLN
INTRAVENOUS | Status: AC
  Filled 2018-07-12: qty 1000

## 2018-07-12 MED ORDER — HEPARIN SODIUM (PORCINE) 1000 UNIT/ML IJ SOLN
INTRAMUSCULAR | Status: DC | PRN
  Administered 2018-07-12: 5000 [IU] via INTRAVENOUS

## 2018-07-12 MED ORDER — MIDAZOLAM HCL 2 MG/2ML IJ SOLN
INTRAMUSCULAR | Status: AC
  Filled 2018-07-12: qty 2

## 2018-07-12 MED ORDER — ONDANSETRON HCL 4 MG/2ML IJ SOLN: 4.0000 mg | Freq: Four times a day (QID) | INTRAMUSCULAR | Status: DC | PRN

## 2018-07-12 MED ORDER — SODIUM CHLORIDE 0.9 % WEIGHT BASED INFUSION
3.0000 mL/kg/h | INTRAVENOUS | Status: AC
  Administered 2018-07-12: 3 mL/kg/h via INTRAVENOUS

## 2018-07-12 MED ORDER — HYDRALAZINE HCL 20 MG/ML IJ SOLN: 10.0000 mg | INTRAMUSCULAR | Status: DC | PRN

## 2018-07-12 MED ORDER — LABETALOL HCL 5 MG/ML IV SOLN: 10.0000 mg | INTRAVENOUS | Status: DC | PRN

## 2018-07-12 MED ORDER — VERAPAMIL HCL 2.5 MG/ML IV SOLN
INTRAVENOUS | Status: AC
  Filled 2018-07-12: qty 2

## 2018-07-12 MED ORDER — SODIUM CHLORIDE 0.9% FLUSH: 3.0000 mL | Freq: Two times a day (BID) | INTRAVENOUS | Status: DC

## 2018-07-12 MED ORDER — SODIUM CHLORIDE 0.9% FLUSH: 3.0000 mL | INTRAVENOUS | Status: DC | PRN

## 2018-07-12 MED ORDER — ACETAMINOPHEN 325 MG PO TABS: 650.0000 mg | ORAL_TABLET | ORAL | Status: DC | PRN

## 2018-07-12 MED ORDER — SODIUM CHLORIDE 0.9 % IV SOLN: 250.0000 mL | INTRAVENOUS | Status: DC | PRN

## 2018-07-12 MED ORDER — VERAPAMIL HCL 2.5 MG/ML IV SOLN
INTRAVENOUS | Status: DC | PRN
  Administered 2018-07-12: 2.5 mg via INTRA_ARTERIAL

## 2018-07-12 MED ORDER — ASPIRIN 81 MG PO CHEW: 81.0000 mg | CHEWABLE_TABLET | ORAL | Status: DC

## 2018-07-12 MED ORDER — IOHEXOL 300 MG/ML  SOLN
INTRAMUSCULAR | Status: DC | PRN
  Administered 2018-07-12: 110 mL via INTRA_ARTERIAL

## 2018-07-12 MED ORDER — FENTANYL CITRATE (PF) 100 MCG/2ML IJ SOLN
INTRAMUSCULAR | Status: DC | PRN
  Administered 2018-07-12: 50 ug via INTRAVENOUS

## 2018-07-12 MED ORDER — HEPARIN SODIUM (PORCINE) 1000 UNIT/ML IJ SOLN
INTRAMUSCULAR | Status: AC
  Filled 2018-07-12: qty 1

## 2018-07-12 MED ORDER — FENTANYL CITRATE (PF) 100 MCG/2ML IJ SOLN
INTRAMUSCULAR | Status: AC
  Filled 2018-07-12: qty 2

## 2018-07-12 SURGICAL SUPPLY — 8 items
CATH INFINITI 5 FR JL3.5 (CATHETERS) ×1 IMPLANT
CATH INFINITI 5FR ANG PIGTAIL (CATHETERS) ×1 IMPLANT
CATH INFINITI JR4 5F (CATHETERS) ×1 IMPLANT
DEVICE RAD TR BAND REGULAR (VASCULAR PRODUCTS) ×1 IMPLANT
GLIDESHEATH SLEND SS 6F .021 (SHEATH) ×1 IMPLANT
KIT MANI 3VAL PERCEP (MISCELLANEOUS) ×2 IMPLANT
PACK CARDIAC CATH (CUSTOM PROCEDURE TRAY) ×2 IMPLANT
WIRE ROSEN-J .035X260CM (WIRE) ×1 IMPLANT

## 2018-07-12 NOTE — Discharge Instructions (Signed)
° °Moderate Conscious Sedation, Adult, Care After °These instructions provide you with information about caring for yourself after your procedure. Your health care provider may also give you more specific instructions. Your treatment has been planned according to current medical practices, but problems sometimes occur. Call your health care provider if you have any problems or questions after your procedure. °What can I expect after the procedure? °After your procedure, it is common: °· To feel sleepy for several hours. °· To feel clumsy and have poor balance for several hours. °· To have poor judgment for several hours. °· To vomit if you eat too soon. °Follow these instructions at home: °For at least 24 hours after the procedure: ° °· Do not: °? Participate in activities where you could fall or become injured. °? Drive. °? Use heavy machinery. °? Drink alcohol. °? Take sleeping pills or medicines that cause drowsiness. °? Make important decisions or sign legal documents. °? Take care of children on your own. °· Rest. °Eating and drinking °· Follow the diet recommended by your health care provider. °· If you vomit: °? Drink water, juice, or soup when you can drink without vomiting. °? Make sure you have little or no nausea before eating solid foods. °General instructions °· Have a responsible adult stay with you until you are awake and alert. °· Take over-the-counter and prescription medicines only as told by your health care provider. °· If you smoke, do not smoke without supervision. °· Keep all follow-up visits as told by your health care provider. This is important. °Contact a health care provider if: °· You keep feeling nauseous or you keep vomiting. °· You feel light-headed. °· You develop a rash. °· You have a fever. °Get help right away if: °· You have trouble breathing. °This information is not intended to replace advice given to you by your health care provider. Make sure you discuss any questions you  have with your health care provider. °Document Released: 10/19/2012 Document Revised: 12/11/2016 Document Reviewed: 04/20/2015 °Elsevier Patient Education © 2020 [employer] Inc[employer] ° °Angiogram, Care After °This sheet gives you information about how to care for yourself after your procedure. Your doctor may also give you more specific instructions. If you have problems or questions, contact your doctor. °Follow these instructions at home: °Insertion site care °· Follow instructions from your doctor about how to take care of your long, thin tube (catheter) insertion area. Make sure you: °? Wash your hands with soap and water before you change your bandage (dressing). If you cannot use soap and water, use hand sanitizer. °? Change your bandage as told by your doctor. °? Leave stitches (sutures), skin glue, or skin tape (adhesive) strips in place. They may need to stay in place for 2 weeks or longer. If tape strips get loose and curl up, you may trim the loose edges. Do not remove tape strips completely unless your doctor says it is okay. °· Do not take baths, swim, or use a hot tub until your doctor says it is okay. °· You may shower 24-48 hours after the procedure or as told by your doctor. °? Gently wash the area with plain soap and water. °? Pat the area dry with a clean towel. °? Do not rub the area. This may cause bleeding. °· Do not apply powder or lotion to the area. Keep the area clean and dry. °· Check your insertion area every day for signs of infection. Check for: °? More redness, swelling, or pain. °?   Fluid or blood. °? Warmth. °? Pus or a bad smell. °Activity °· Rest as told by your doctor, usually for 1-2 days. °· Do not lift anything that is heavier than 10 lbs. (4.5 kg) or as told by your doctor. °· Do not drive for 24 hours if you were given a medicine to help you relax (sedative). °· Do not drive or use heavy machinery while taking prescription pain medicine. °General instructions ° °· Go back to your  normal activities as told by your doctor, usually in about a week. Ask your doctor what activities are safe for you. °· If the insertion area starts to bleed, lie flat and put pressure on the area. If the bleeding does not stop, get help right away. This is an emergency. °· Drink enough fluid to keep your pee (urine) clear or pale yellow. °· Take over-the-counter and prescription medicines only as told by your doctor. °· Keep all follow-up visits as told by your doctor. This is important. °Contact a doctor if: °· You have a fever. °· You have chills. °· You have more redness, swelling, or pain around your insertion area. °· You have fluid or blood coming from your insertion area. °· The insertion area feels warm to the touch. °· You have pus or a bad smell coming from your insertion area. °· You have more bruising around the insertion area. °· Blood collects in the tissue around the insertion area (hematoma) that may be painful to the touch. °Get help right away if: °· You have a lot of pain in the insertion area. °· The insertion area swells very fast. °· The insertion area is bleeding, and the bleeding does not stop after holding steady pressure on the area. °· The area near or just beyond the insertion area becomes pale, cool, tingly, or numb. °These symptoms may be an emergency. Do not wait to see if the symptoms will go away. Get medical help right away. Call your local emergency services (911 in the [place] Do not drive yourself to the hospital. °Summary °· After the procedure, it is common to have bruising and tenderness at the long, thin tube insertion area. °· After the procedure, it is important to rest and drink plenty of fluids. °· Do not take baths, swim, or use a hot tub until your doctor says it is okay to do so. You may shower 24-48 hours after the procedure or as told by your doctor. °· If the insertion area starts to bleed, lie flat and put pressure on the area. If the bleeding does not stop, get  help right away. This is an emergency. °This information is not intended to replace advice given to you by your health care provider. Make sure you discuss any questions you have with your health care provider. °Document Released: 03/27/2008 Document Revised: 12/11/2016 Document Reviewed: 12/24/2015 °Elsevier Patient Education © 2020 Elsevier Inc. ° °

## 2018-07-13 ENCOUNTER — Ambulatory Visit: Payer: Self-pay | Admitting: Family Medicine

## 2018-08-10 DIAGNOSIS — G4733 Obstructive sleep apnea (adult) (pediatric): Secondary | ICD-10-CM | POA: Insufficient documentation

## 2018-08-18 ENCOUNTER — Ambulatory Visit (INDEPENDENT_AMBULATORY_CARE_PROVIDER_SITE_OTHER): Payer: Self-pay | Admitting: Family Medicine

## 2018-08-18 ENCOUNTER — Encounter: Payer: Self-pay | Admitting: Family Medicine

## 2018-08-18 ENCOUNTER — Other Ambulatory Visit: Payer: Self-pay

## 2018-08-18 VITALS — BP 120/77 | HR 78 | Temp 97.8°F | Ht 70.0 in | Wt 219.0 lb

## 2018-08-18 DIAGNOSIS — I10 Essential (primary) hypertension: Secondary | ICD-10-CM

## 2018-08-18 DIAGNOSIS — I2510 Atherosclerotic heart disease of native coronary artery without angina pectoris: Secondary | ICD-10-CM

## 2018-08-18 DIAGNOSIS — E782 Mixed hyperlipidemia: Secondary | ICD-10-CM

## 2018-08-18 DIAGNOSIS — E1159 Type 2 diabetes mellitus with other circulatory complications: Secondary | ICD-10-CM

## 2018-08-18 LAB — POCT UA - MICROALBUMIN: Microalbumin Ur, POC: 50 mg/L

## 2018-08-18 LAB — POCT URINALYSIS DIPSTICK
Bilirubin, UA: NEGATIVE
Blood, UA: NORMAL
Glucose, UA: POSITIVE — AB
Ketones, UA: NEGATIVE
Leukocytes, UA: NEGATIVE
Nitrite, UA: NEGATIVE
Protein, UA: NEGATIVE
Spec Grav, UA: >=1.03 — AB (ref 1.010–1.025)
Urobilinogen, UA: 0.2 E.U./dL
pH, UA: 5 (ref 5.0–8.0)

## 2018-08-18 NOTE — Progress Notes (Signed)
Patient: Samuel Gentry, Male    DOB: September 25, 1955, 63 y.o.   MRN: [id number] Visit Date: 08/18/2018  Today's Provider: [name], MD   Chief Complaint  Patient presents with  . Annual Exam   Subjective:     Annual physical exam [name] is a 63 y.o. male who presents today for health maintenance and complete physical. He feels well. He reports exercising some. He reports he is sleeping well.  -----------------------------------------------------------------   Review of Systems  Constitutional: Negative.   HENT: Negative.   Eyes: Negative.   Respiratory: Negative.   Cardiovascular: Negative.   Gastrointestinal: Negative.   Endocrine: Negative.   Genitourinary: Negative.   Musculoskeletal: Positive for arthralgias, joint swelling, neck pain and neck stiffness. Negative for back pain, gait problem and myalgias.  Skin: Negative.   Allergic/Immunologic: Negative.   Neurological: Positive for headaches. Negative for dizziness, tremors, seizures, syncope, facial asymmetry, speech difficulty, weakness, light-headedness and numbness.  Hematological: Negative.   Psychiatric/Behavioral: Negative.     Social History      He  reports that he quit smoking about 36 years ago. His smoking use included cigarettes. He has a 16.00 pack-year smoking history. He has never used smokeless tobacco. He reports that he does not drink alcohol or use drugs.       Social History   Socioeconomic History  . Marital status: Married    Spouse name: Not on file  . Number of children: Not on file  . Years of education: Not on file  . Highest education level: Not on file  Occupational History  . Not on file  Social Needs  . Financial resource strain: Not on file  . Food insecurity    Worry: Not on file    Inability: Not on file  . Transportation needs    Medical: Not on file    Non-medical: Not on file  Tobacco Use  . Smoking status: Former Smoker    Packs/day: 2.00    Years: 8.00    Pack years: 16.00    Types: Cigarettes    Quit date: 02/08/1982    Years since quitting: 36.5  . Smokeless tobacco: Never Used  Substance and Sexual Activity  . Alcohol use: No  . Drug use: No  . Sexual activity: Not on file  Lifestyle  . Physical activity    Days per week: Not on file    Minutes per session: Not on file  . Stress: Not on file  Relationships  . Social Herbalist on phone: Not on file    Gets together: Not on file    Attends religious service: Not on file    Active member of club or organization: Not on file    Attends meetings of clubs or organizations: Not on file    Relationship status: Not on file  Other Topics Concern  . Not on file  Social History Narrative  . Not on file    Past Medical History:  Diagnosis Date  . Anxiety   . Arthritis   . Coronary atherosclerosis of native coronary artery 11/19/2004   2 stents placed to the LAD in [place], [place].   . DDD (degenerative disc disease), lumbar   . GERD (gastroesophageal reflux disease)   . Other and unspecified hyperlipidemia   . Status post cardiac catheterization 2003   [facility]  . Type II or unspecified type diabetes mellitus without mention of complication, not stated as  uncontrolled   . Unspecified essential hypertension   . Wears glasses      Patient Active Problem List   Diagnosis Date Noted  . Unstable angina ([place]) 07/07/2018  . Stress fracture, left foot, initial encounter for fracture 10/16/2017  . Seronegative rheumatoid arthritis ([place]) 08/06/2016  . Pain of upper abdomen 10/02/2015  . Diastasis recti 10/02/2015  . Foot pain, right 07/25/2014  . AA (alopecia areata) 04/25/2014  . Angina of effort ([place]) 04/25/2014  . Arthritis 04/25/2014  . CAD in native artery 04/25/2014  . History of chicken pox 04/25/2014  . Abrasion of cornea 04/25/2014  . Depression, major, single episode, mild ([place]) 04/25/2014  . HLD (hyperlipidemia) 04/25/2014  . BP (high blood  pressure) 04/25/2014  . Chest pain 02/09/2012  . CAD (coronary artery disease) 02/09/2012  . S/P coronary artery stent placement 02/09/2012  . Diabetes mellitus ([place]) 02/09/2012  . Hyperlipidemia 02/09/2012    Past Surgical History:  Procedure Laterality Date  . ANKLE FUSION  2003   left ankle  . BACK SURGERY  93,97   lumb x2  . CARDIAC CATHETERIZATION  2003  . COLONOSCOPY WITH PROPOFOL N/A 11/06/2015   Procedure: COLONOSCOPY WITH PROPOFOL;  Surgeon: Robert Bellow, MD;  [facility] END[facility];  Service: Endoscopy;  Laterality: N/A;  . CORONARY ANGIOPLASTY  2006  . DORSAL COMPARTMENT RELEASE Left 12/29/2013   Procedure: LEFT WRIST RELEASE DORSAL COMPARTMENT (DE[place]);  Surgeon: Jolyn Nap, MD;  Location: [facility];  Service: Orthopedics;  Laterality: Left;  . ESOPHAGOGASTRODUODENOSCOPY (EGD) WITH PROPOFOL N/A 11/06/2015   Procedure: ESOPHAGOGASTRODUODENOSCOPY (EGD) WITH PROPOFOL;  Surgeon: Robert Bellow, MD;  Location: [facility] ENDOSCOPY;  Service: Endoscopy;  Laterality: N/A;  . KNEE SURGERY  1978   rt  . LEFT HEART CATH AND CORONARY ANGIOGRAPHY Left 07/12/2018   Procedure: LEFT HEART CATH AND CORONARY ANGIOGRAPHY;  Surgeon: Corey Skains, MD;  Location: [facility];  Service: Cardiovascular;  Laterality: Left;  . NECK SURGERY  2010    Family History        Family Status  Relation Name Status  . Mother  Alive  . Father  Deceased       diabetes  . Brother  Alive        His family history includes Diabetes in his father; Hyperlipidemia in his mother; Prostate cancer in his father.      Allergies  Allergen Reactions  . Prednisone Other (See Comments)    Makes him very mean     Current Outpatient Medications:  .  acetaminophen (TYLENOL) 500 MG tablet, Take 1,000 mg by mouth every 6 (six) hours as needed for moderate pain or headache., Disp: , Rfl:  .  amLODipine (NORVASC) 5 MG tablet, TAKE 1 TABLET(5 MG) BY MOUTH DAILY (Patient  taking differently: Take 5 mg by mouth daily. ), Disp: 90 tablet, Rfl: 3 .  aspirin 325 MG tablet, Take 325 mg by mouth daily. , Disp: , Rfl:  .  atenolol (TENORMIN) 50 MG tablet, Take 1 tablet (50 mg total) by mouth daily., Disp: 90 tablet, Rfl: 3 .  BD PEN NEEDLE NANO U/F 32G X 4 MM MISC, USE AS DIRECTED WITH TRESIBA, Disp: 100 each, Rfl: 11 .  Blood Glucose Monitoring Suppl (CONTOUR NEXT MONITOR) w/Device KIT, USE AS DIRECTED, Disp: 1 kit, Rfl: 0 .  butalbital-acetaminophen-caffeine (FIORICET) 50-325-40 MG tablet, Take 1 tablet by mouth every 8 (eight) hours as needed for headache., Disp: 30 tablet, Rfl: 3 .  celecoxib (CELEBREX) 200 MG capsule, TAKE 1 CAPSULE(200 MG) BY MOUTH DAILY AS NEEDED (Patient taking differently: Take 200 mg by mouth daily. ), Disp: 90 capsule, Rfl: 3 .  CONTOUR NEXT TEST test strip, CHECK SUGAR ONCE DAILY AS DIRECTED, Disp: 100 each, Rfl: 5 .  Dulaglutide (TRULICITY) 1.5 WG/9.5AO SOPN, Inject 1 pen into the skin once a week. (Patient taking differently: Inject 1.5 mg into the skin every Friday. ), Disp: 4 pen, Rfl: 11 .  lisinopril (ZESTRIL) 20 MG tablet, TAKE 1 TABLET(20 MG) BY MOUTH DAILY, Disp: 90 tablet, Rfl: 4 .  metFORMIN (GLUCOPHAGE) 1000 MG tablet, TAKE 1 TABLET(1000 MG) BY MOUTH TWICE DAILY WITH A MEAL (Patient taking differently: Take 1,000 mg by mouth 2 (two) times a day. ), Disp: 180 tablet, Rfl: 4 .  MICROLET LANCETS MISC, USE AS DIRECTED ONCE DAILY, Disp: 100 each, Rfl: 3 .  MULTIPLE VITAMINS PO, Take 1 tablet by mouth daily., Disp: , Rfl:  .  nitroGLYCERIN (NITROSTAT) 0.4 MG SL tablet, Place 0.4 mg under the tongue every 5 (five) minutes as needed for chest pain. , Disp: , Rfl:  .  omeprazole (PRILOSEC) 20 MG capsule, Take 1 capsule (20 mg total) by mouth 2 (two) times daily before a meal. (Patient taking differently: Take 20 mg by mouth daily. ), Disp: 60 capsule, Rfl: 12 .  rosuvastatin (CRESTOR) 40 MG tablet, TAKE 1 TABLET(40 MG) BY MOUTH DAILY (Patient  taking differently: Take 40 mg by mouth daily. ), Disp: 30 tablet, Rfl: 11 .  sertraline (ZOLOFT) 50 MG tablet, TAKE 1 TABLET(50 MG) BY MOUTH AT BEDTIME (Patient taking differently: Take 50 mg by mouth daily. ), Disp: 90 tablet, Rfl: 3 .  TRESIBA FLEXTOUCH 100 UNIT/ML SOPN FlexTouch Pen, INJECT UNDER THE SKIN UP TO 30 UNITS DAILY EVERY DAY AS DIRECTED (Patient taking differently: Inject 26 Units into the skin daily. ), Disp: 12 pen, Rfl: 3 .  JARDIANCE 25 MG TABS tablet, TAKE 1 TABLET BY MOUTH EVERY DAY (Patient not taking: No sig reported), Disp: 30 tablet, Rfl: 12   Patient Care Team: Jerrol Banana., MD as PCP - General (Unknown Physician Specialty) Minna Merritts, MD as Consulting Physician (Cardiology) Jerrol Banana., MD (Family Medicine) Bary Castilla, Forest Gleason, MD (General Surgery) Cathi Roan, [facility] (Pharmacist)    Objective:    Vitals: BP 120/77 (BP Location: Right Arm, Patient Position: Sitting, Cuff Size: Large)   Pulse 78   Temp 97.8 F (36.6 C) (Oral)   Ht _0  (1.778 m)   Wt 219 lb (99.3 kg)   BMI 31.42 kg/m    Vitals:   08/18/18 0910  BP: 120/77  Pulse: 78  Temp: 97.8 F (36.6 C)  TempSrc: Oral  Weight: 219 lb (99.3 kg)  Height: _1  (1.778 m)     Physical Exam Vitals signs reviewed.  Constitutional:      Appearance: He is well-developed.  HENT:     Head: Normocephalic and atraumatic.     Right Ear: External ear normal.     Left Ear: External ear normal.  Eyes:     General: No scleral icterus.    Conjunctiva/sclera: Conjunctivae normal.  Neck:     Thyroid: No thyromegaly.  Cardiovascular:     Rate and Rhythm: Normal rate and regular rhythm.     Heart sounds: Normal heart sounds.  Pulmonary:     Effort: Pulmonary effort is normal.     Breath sounds: Normal breath sounds.  Abdominal:     Palpations: Abdomen is soft.  Genitourinary:    Penis: Normal.      Scrotum/Testes: Normal.  Skin:    General: Skin is warm and dry.   Neurological:     Mental Status: He is alert and oriented to person, place, and time.  Psychiatric:        Behavior: Behavior normal.        Thought Content: Thought content normal.        Judgment: Judgment normal.      Depression Screen PHQ 2/9 Scores 08/18/2018 10/14/2017 10/07/2016  PHQ - 2 Score 1 0 2  PHQ- 9 Score 3 0 3       Assessment & Plan:     Routine Health Maintenance and Physical Exam  Exercise Activities and Dietary recommendations Goals    . "I need help affording my medications" (pt-stated)     Pharmacist Clinical Goal(s): Over the next 10 days, Mr.. Tosh will provide the necessary supplementary documents (proof of out of pocket prescription expenditure, proof of household income) needed for medication assistance applications to CCM pharmacist.   Interventions:  CCM pharmacist will investigate different medication assistance foundations and programs for Mr. Maceachern to apply for, primarily for Trulicity and [place].   Partially completed: 02/21/2018, patient has been approved for medication assistance for Trulicity and [place] A[place], expires 01 28 2021  Updated 06/20/18: CCM pharmacist completed refill, copy uploaded under media tab.  As of 02/28/18, patient was denied for Jardiance assistance [facility]) for "over income"; contacted Marjory Lies, Wellsite geologist for [employer], who recommended sending in denial letter for redetermination. Decision will be made within 2 weeks   03/14/18: Update from [employer], representative [place] on 02/28/18 provided incorrect information, patient needs to supply a financial hardship letter and an out of pocket test claim from the patient's pharmacy.   Current Barriers:  . No prescription insurance coverage . Not able to use manufacturer's coupons due to lack of commercial coverage  Patient Self Care Activities:  . Collaborate with CCM pharmacist and provide necessary paperwork    Please see past updates related to this goal by  clicking on the "Past Updates" button in the selected goal          Immunization History  Administered Date(s) Administered  . Influenza,inj,Quad PF,6+ Mos 11/25/2015, 10/07/2016, 10/14/2017  . Pneumococcal Polysaccharide-23 10/07/2016  . Tdap 01/02/2010    Health Maintenance  Topic Date Due  . FOOT EXAM  10/07/2017  . OPHTHALMOLOGY EXAM  01/15/2018  . INFLUENZA VACCINE  08/13/2018  . HEMOGLOBIN A1C  12/13/2018  . TETANUS/TDAP  01/03/2020  . COLONOSCOPY  11/05/2025  . PNEUMOCOCCAL POLYSACCHARIDE VACCINE AGE 15-64 HIGH RISK  Completed  . Hepatitis C Screening  Completed  . HIV Screening  Completed     Discussed health benefits of physical activity, and encouraged him to engage in regular exercise appropriate for his age and condition.  1. Type 2 diabetes mellitus with other circulatory complication, without long-term current use of insulin ([place]) Off of Jardiance due to cost and no insurance. - POCT urinalysis dipstick - POCT UA - Microalbumin  2. CAD in native artery   3. Mixed hyperlipidemia   4. Essential hypertension     --------------------------------------------------------------------    [name], MD  [facility]

## 2018-11-15 ENCOUNTER — Other Ambulatory Visit: Payer: Self-pay | Admitting: Family Medicine

## 2018-11-15 DIAGNOSIS — I10 Essential (primary) hypertension: Secondary | ICD-10-CM

## 2018-11-24 ENCOUNTER — Ambulatory Visit: Payer: Self-pay | Admitting: Family Medicine

## 2018-12-01 ENCOUNTER — Telehealth: Payer: Self-pay

## 2018-12-12 ENCOUNTER — Ambulatory Visit: Payer: Self-pay | Admitting: Pharmacist

## 2018-12-12 ENCOUNTER — Telehealth: Payer: Self-pay

## 2018-12-13 NOTE — Chronic Care Management (AMB) (Signed)
  Care Management   Note  12/13/2018 Name: GEO SLONE MRN: [id number] DOB: 26-Oct-1955  63 y.o. year old male referred to Care Management by Dr. Rosanna Randy for medication assistance. CCM clinical pharmacist returning message patient had left on clinician's voicemail.   Was unable to reach patient via telephone today and have left HIPAA compliant voicemail asking patient to return my call. (unsuccessful outreach #1).   Follow up plan:  A HIPPA compliant phone message was left for the patient providing contact information and requesting a return call.  The care management team will reach out to the patient again over the next 5-10 days.   Ruben Reason, PharmD Clinical Pharmacist [place] [phone]

## 2018-12-14 ENCOUNTER — Encounter: Payer: Self-pay | Admitting: Family Medicine

## 2018-12-14 ENCOUNTER — Other Ambulatory Visit: Payer: Self-pay

## 2018-12-14 ENCOUNTER — Ambulatory Visit (INDEPENDENT_AMBULATORY_CARE_PROVIDER_SITE_OTHER): Payer: Self-pay | Admitting: Family Medicine

## 2018-12-14 VITALS — BP 120/70 | HR 96 | Temp 97.3°F | Resp 16 | Ht 70.0 in | Wt 219.4 lb

## 2018-12-14 DIAGNOSIS — G4733 Obstructive sleep apnea (adult) (pediatric): Secondary | ICD-10-CM

## 2018-12-14 DIAGNOSIS — E1159 Type 2 diabetes mellitus with other circulatory complications: Secondary | ICD-10-CM

## 2018-12-14 DIAGNOSIS — M0600 Rheumatoid arthritis without rheumatoid factor, unspecified site: Secondary | ICD-10-CM

## 2018-12-14 DIAGNOSIS — E782 Mixed hyperlipidemia: Secondary | ICD-10-CM

## 2018-12-14 DIAGNOSIS — N529 Male erectile dysfunction, unspecified: Secondary | ICD-10-CM

## 2018-12-14 DIAGNOSIS — I2510 Atherosclerotic heart disease of native coronary artery without angina pectoris: Secondary | ICD-10-CM

## 2018-12-14 LAB — POCT GLYCOSYLATED HEMOGLOBIN (HGB A1C)
Est. average glucose Bld gHb Est-mCnc: 209
Hemoglobin A1C: 8.9 % — AB (ref 4.0–5.6)

## 2018-12-14 MED ORDER — TADALAFIL 20 MG PO TABS: 20.0000 mg | ORAL_TABLET | Freq: Three times a day (TID) | ORAL | 5 refills | Status: DC | PRN

## 2018-12-14 MED ORDER — TADALAFIL 20 MG PO TABS: 20.0000 mg | ORAL_TABLET | ORAL | 5 refills | Status: DC | PRN

## 2018-12-14 NOTE — Progress Notes (Signed)
Patient: Samuel Gentry Male    DOB: [birth year]   63 y.o.   MRN: [id number] Visit Date: 12/14/2018  Today's Provider: [name], MD   Chief Complaint  Patient presents with  . Follow-up    T2DM.HTN,   Subjective:    I,Joseline E. Rosas,RMA am acting as a Education administrator for Jerrol Banana., MD.  HPI  Overall he is doing well.  His main complaint is joint stiffness which he has had for some time.  It is mainly in his hands now.  Not involving the wrist presently.  His left ankle has chronic pain and soreness due to old trauma. His cardiac status is been stable for some time.  He had a good report from his cardiologist on the last visit.  He does complain of some ED recently the he would love to have addressed medically. Type 2 diabetes mellitus with other circulatory complication, without long-term current use of insulin ([place]) From 08/18/2018-off Jardiance due cost and insurance. Hgb A1c 06/13/2018 was 9.2.  Lab Results  Component Value Date   HGBA1C 8.9 (A) 12/14/2018   HGBA1C 9.2 (H) 06/13/2018   HGBA1C 8.2 (H) 02/17/2018    Last seen for diabetes 4 months ago.  Management since then includes none. He reports excellent compliance with treatment. He is not having side effects.  Current symptoms include none and have been stable. Home blood sugar records: fasting range: 200  Episodes of hypoglycemia? no Eye Exam: 12/19/2018 [facility] in [place]   Pertinent Labs:    Component Value Date/Time   CHOL 152 10/14/2017 0956   TRIG 170 (H) 10/14/2017 0956   HDL 34 (L) 10/14/2017 0956   LDLCALC 84 10/14/2017 0956   LDLCALC 121 (H) 10/07/2016 1033   CREATININE 1.38 (H) 06/13/2018 1523   CREATININE 1.11 10/07/2016 1033     Coronary artery disease of native artery of native heart with stable angina pectoris ([place]) From 08/18/2018-All risk factors treated.  Essential hypertension From 08/18/2018-Controlled.  BP Readings from Last 3 Encounters:  12/14/18 120/70   08/18/18 120/77  07/12/18 113/73    He was last seen for hypertension 4 months ago.  BP at that visit was 120/77. Management since that visit includes none. He reports excellent compliance with treatment. He is not having side effects.  He average adherent to low salt diet.   Outside blood pressures are n/a. He is experiencing none.  Patient denies chest pain, chest pressure/discomfort, fatigue, irregular heart beat, lower extremity edema, near-syncope and palpitations.   Cardiovascular risk factors include advanced age (older than 79 for men, 56 for women), diabetes mellitus, dyslipidemia, hypertension and male gender.   Mixed hyperlipidemia From 10/14/2017-stable.   Allergies  Allergen Reactions  . Prednisone Other (See Comments)    Makes him very mean     Current Outpatient Medications:  .  acetaminophen (TYLENOL) 500 MG tablet, Take 1,000 mg by mouth every 6 (six) hours as needed for moderate pain or headache., Disp: , Rfl:  .  amLODipine (NORVASC) 5 MG tablet, TAKE 1 TABLET BY MOUTH EVERY DAY, Disp: 180 tablet, Rfl: 0 .  aspirin 325 MG tablet, Take 325 mg by mouth daily. , Disp: , Rfl:  .  BD PEN NEEDLE NANO U/F 32G X 4 MM MISC, USE AS DIRECTED WITH TRESIBA, Disp: 100 each, Rfl: 11 .  Blood Glucose Monitoring Suppl (CONTOUR NEXT MONITOR) w/Device KIT, USE AS DIRECTED, Disp: 1 kit, Rfl: 0 .  CONTOUR NEXT  TEST test strip, CHECK SUGAR ONCE DAILY AS DIRECTED, Disp: 100 each, Rfl: 5 .  Dulaglutide (TRULICITY) 1.5 SW/9.6PR SOPN, Inject 1 pen into the skin once a week. (Patient taking differently: Inject 1.5 mg into the skin every Friday. ), Disp: 4 pen, Rfl: 11 .  isosorbide mononitrate (IMDUR) 30 MG 24 hr tablet, Take by mouth., Disp: , Rfl:  .  lisinopril (ZESTRIL) 20 MG tablet, TAKE 1 TABLET(20 MG) BY MOUTH DAILY, Disp: 90 tablet, Rfl: 4 .  metFORMIN (GLUCOPHAGE) 1000 MG tablet, TAKE 1 TABLET(1000 MG) BY MOUTH TWICE DAILY WITH A MEAL (Patient taking differently: Take 1,000 mg  by mouth 2 (two) times a day. ), Disp: 180 tablet, Rfl: 4 .  MICROLET LANCETS MISC, USE AS DIRECTED ONCE DAILY, Disp: 100 each, Rfl: 3 .  MULTIPLE VITAMINS PO, Take 1 tablet by mouth daily., Disp: , Rfl:  .  nitroGLYCERIN (NITROSTAT) 0.4 MG SL tablet, Place 0.4 mg under the tongue every 5 (five) minutes as needed for chest pain. , Disp: , Rfl:  .  omeprazole (PRILOSEC) 20 MG capsule, Take 1 capsule (20 mg total) by mouth 2 (two) times daily before a meal. (Patient taking differently: Take 20 mg by mouth daily. ), Disp: 60 capsule, Rfl: 12 .  rosuvastatin (CRESTOR) 40 MG tablet, TAKE 1 TABLET(40 MG) BY MOUTH DAILY (Patient taking differently: Take 40 mg by mouth daily. ), Disp: 30 tablet, Rfl: 11 .  sertraline (ZOLOFT) 50 MG tablet, TAKE 1 TABLET(50 MG) BY MOUTH AT BEDTIME (Patient taking differently: Take 50 mg by mouth daily. ), Disp: 90 tablet, Rfl: 3 .  TRESIBA FLEXTOUCH 100 UNIT/ML SOPN FlexTouch Pen, INJECT UNDER THE SKIN UP TO 30 UNITS DAILY EVERY DAY AS DIRECTED (Patient taking differently: Inject 26 Units into the skin daily. ), Disp: 12 pen, Rfl: 3 .  butalbital-acetaminophen-caffeine (FIORICET) 50-325-40 MG tablet, Take 1 tablet by mouth every 8 (eight) hours as needed for headache. (Patient not taking: Reported on 12/14/2018), Disp: 30 tablet, Rfl: 3 .  celecoxib (CELEBREX) 200 MG capsule, TAKE 1 CAPSULE(200 MG) BY MOUTH DAILY AS NEEDED (Patient not taking: No sig reported), Disp: 90 capsule, Rfl: 3  Review of Systems  Constitutional: Negative for appetite change, chills and fever.  HENT: Negative.   Eyes: Negative.   Respiratory: Negative for chest tightness, shortness of breath and wheezing.   Cardiovascular: Negative for chest pain and palpitations.  Gastrointestinal: Negative for abdominal pain, nausea and vomiting.  Endocrine: Negative.   Genitourinary:       ED  Musculoskeletal: Positive for arthralgias.  Allergic/Immunologic: Negative.   Hematological: Negative.    Psychiatric/Behavioral: Negative.     Social History   Tobacco Use  . Smoking status: Former Smoker    Packs/day: 2.00    Years: 8.00    Pack years: 16.00    Types: Cigarettes    Quit date: 02/08/1982    Years since quitting: 36.8  . Smokeless tobacco: Never Used  Substance Use Topics  . Alcohol use: No      Objective:   BP 120/70 (BP Location: Right Arm, Patient Position: Sitting, Cuff Size: Large)   Pulse 96   Temp (!) 97.3 F (36.3 C) (Temporal)   Resp 16   Ht 5' 10"  (1.778 m)   Wt 219 lb 6.4 oz (99.5 kg)   SpO2 97%   BMI 31.48 kg/m  Vitals:   12/14/18 1536  BP: 120/70  Pulse: 96  Resp: 16  Temp: (!) 97.3 F (36.3 C)  TempSrc: Temporal  SpO2: 97%  Weight: 219 lb 6.4 oz (99.5 kg)  Height: 5' 10"  (1.778 m)  Body mass index is 31.48 kg/m.   Physical Exam Vitals signs reviewed.  Constitutional:      Appearance: He is well-developed.  HENT:     Head: Normocephalic and atraumatic.     Right Ear: External ear normal.     Left Ear: External ear normal.  Eyes:     General: No scleral icterus.    Conjunctiva/sclera: Conjunctivae normal.  Neck:     Thyroid: No thyromegaly.  Cardiovascular:     Rate and Rhythm: Normal rate and regular rhythm.     Heart sounds: Normal heart sounds.  Pulmonary:     Effort: Pulmonary effort is normal.     Breath sounds: Normal breath sounds.  Abdominal:     Palpations: Abdomen is soft.  Genitourinary:    Penis: Normal.      Scrotum/Testes: Normal.  Skin:    General: Skin is warm and dry.  Neurological:     Mental Status: He is alert and oriented to person, place, and time.  Psychiatric:        Behavior: Behavior normal.        Thought Content: Thought content normal.        Judgment: Judgment normal.      Results for orders placed or performed in visit on 12/14/18  POCT HgB A1C  Result Value Ref Range   Hemoglobin A1C 8.9 (A) 4.0 - 5.6 %   Est. average glucose Bld gHb Est-mCnc 209        Assessment & Plan     1. Type 2 diabetes mellitus with other circulatory complication, without long-term current use of insulin ([facility]) A1c 8.9 today.  Have limited options to change things as he has no insurance.  This should change next year.  On Trulicity Metformin and Tresiba - POCT HgB A1C  2. Erectile dysfunction, unspecified erectile dysfunction type Patient is having no CAD symptoms.  Cardiologist states that he is doing well.  Try Cialis. - tadalafil (CIALIS) 20 MG tablet; Take 1 tablet (20 mg total) by mouth every three (3) days as needed for erectile dysfunction.  Dispense: 30 tablet; Refill: 5  3. CAD in native artery Stable, all risk factors treated.  No angina.  4. OSA (obstructive sleep apnea) On CPAP  5. Seronegative rheumatoid arthritis ([place]) Patient has is diagnosis.  It is unclear whether this is actually accurate at this time.  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  6. Mixed hyperlipidemia On Crestor 40.     [name], MD  [facility]

## 2019-01-04 ENCOUNTER — Telehealth: Payer: Self-pay | Admitting: Family Medicine

## 2019-01-04 NOTE — Telephone Encounter (Signed)
This is no longer on patient med list.  It appears it was d/c'ed on 12/14/18 due to 'change in therapy'

## 2019-01-04 NOTE — Telephone Encounter (Signed)
Noted  

## 2019-01-04 NOTE — Telephone Encounter (Signed)
LMOVM for pt to return call. Need clarification on if pt is still taking this medication. Okay for [facility] to obtain information.

## 2019-01-04 NOTE — Telephone Encounter (Signed)
Pt stated he no longer takes the atenolol (TENORMIN) 50 MG tablet .

## 2019-01-04 NOTE — Telephone Encounter (Signed)
[facility] faxed refill request for the following medications:  atenolol (TENORMIN) 50 MG tablet [[id number]]   Please advise.  Thanks, [employer]

## 2019-01-16 ENCOUNTER — Other Ambulatory Visit: Payer: Self-pay | Admitting: Family Medicine

## 2019-01-16 DIAGNOSIS — F320 Major depressive disorder, single episode, mild: Secondary | ICD-10-CM

## 2019-01-16 LAB — HM DIABETES EYE EXAM

## 2019-01-16 NOTE — Telephone Encounter (Signed)
[facility] faxed refill request for the following medications:  sertraline (ZOLOFT) 50 MG tablet    Please advise.  

## 2019-01-17 MED ORDER — SERTRALINE HCL 50 MG PO TABS: 50.0000 mg | ORAL_TABLET | Freq: Every day | ORAL | 3 refills | Status: DC

## 2019-03-29 ENCOUNTER — Encounter: Payer: Self-pay | Admitting: Family Medicine

## 2019-03-29 ENCOUNTER — Ambulatory Visit
Admission: RE | Admit: 2019-03-29 | Discharge: 2019-03-29 | Disposition: A | Discharge status: Home / Self Care | Payer: Self-pay | Source: Ambulatory Visit | Attending: Family Medicine | Admitting: Family Medicine

## 2019-03-29 ENCOUNTER — Ambulatory Visit (INDEPENDENT_AMBULATORY_CARE_PROVIDER_SITE_OTHER): Payer: Self-pay | Admitting: Family Medicine

## 2019-03-29 ENCOUNTER — Other Ambulatory Visit: Payer: Self-pay

## 2019-03-29 VITALS — BP 130/86 | HR 74 | Temp 96.6°F | Resp 16 | Wt 215.0 lb

## 2019-03-29 DIAGNOSIS — M25511 Pain in right shoulder: Secondary | ICD-10-CM | POA: Insufficient documentation

## 2019-03-29 MED ORDER — LIDOCAINE HCL (PF) 1 % IJ SOLN
2.0000 mL | Freq: Once | INTRAMUSCULAR | Status: AC
  Administered 2019-03-29: 2 mL via INTRADERMAL

## 2019-03-29 MED ORDER — METHYLPREDNISOLONE ACETATE 40 MG/ML IJ SUSP
40.0000 mg | Freq: Once | INTRAMUSCULAR | Status: AC
  Administered 2019-03-29: 40 mg via INTRAMUSCULAR

## 2019-03-29 NOTE — Progress Notes (Signed)
Patient: Samuel Gentry Male    DOB: [birth year]   64 y.o.   MRN: [id number] Visit Date: 03/29/2019  Today's Provider: Lavon Paganini, MD   Chief Complaint  Patient presents with  . Shoulder Pain   Subjective:     Patient reports he was seen by Chiropractor about 2 weeks ago, and was told that his cartilage is gone, and that most likely he will need surgery. Patient is hoping to get a cortisone injection today.  Shoulder Pain  The pain is present in the right shoulder, right arm and right wrist. This is a new problem. Episode onset: 2-3 months. There has been no history of extremity trauma. The problem occurs constantly. The problem has been gradually worsening. The quality of the pain is described as aching. The pain is at a severity of 9/10. The pain is moderate. Associated symptoms include an inability to bear weight, a limited range of motion and stiffness. Pertinent negatives include no fever, itching, joint locking, joint swelling or numbness. The symptoms are aggravated by activity. He has tried acetaminophen, NSAIDS and cold for the symptoms. The treatment provided no relief.   R shoulder 2-3 months Getting worse Aching constantly  Sometimes radiates into upper arm Rides motorcycle with tall handle bars and hurts to ride now Worse with overhead motion Wakes im up overnight Hurts to reach behind his back Aleve without relief chiropractor did not xray  Allergies  Allergen Reactions  . Prednisone Other (See Comments)    Makes him very mean     Current Outpatient Medications:  .  acetaminophen (TYLENOL) 500 MG tablet, Take 1,000 mg by mouth every 6 (six) hours as needed for moderate pain or headache., Disp: , Rfl:  .  amLODipine (NORVASC) 5 MG tablet, TAKE 1 TABLET BY MOUTH EVERY DAY, Disp: 180 tablet, Rfl: 0 .  aspirin 325 MG tablet, Take 325 mg by mouth daily. , Disp: , Rfl:  .  BD PEN NEEDLE NANO U/F 32G X 4 MM MISC, USE AS DIRECTED WITH TRESIBA, Disp: 100  each, Rfl: 11 .  Blood Glucose Monitoring Suppl (CONTOUR NEXT MONITOR) w/Device KIT, USE AS DIRECTED, Disp: 1 kit, Rfl: 0 .  CONTOUR NEXT TEST test strip, CHECK SUGAR ONCE DAILY AS DIRECTED, Disp: 100 each, Rfl: 5 .  Dulaglutide (TRULICITY) 1.5 PQ/9.8YM SOPN, Inject 1 pen into the skin once a week. (Patient taking differently: Inject 1.5 mg into the skin every Friday. ), Disp: 4 pen, Rfl: 11 .  isosorbide mononitrate (IMDUR) 30 MG 24 hr tablet, Take by mouth., Disp: , Rfl:  .  lisinopril (ZESTRIL) 20 MG tablet, TAKE 1 TABLET(20 MG) BY MOUTH DAILY, Disp: 90 tablet, Rfl: 4 .  metFORMIN (GLUCOPHAGE) 1000 MG tablet, TAKE 1 TABLET(1000 MG) BY MOUTH TWICE DAILY WITH A MEAL (Patient taking differently: Take 1,000 mg by mouth 2 (two) times a day. ), Disp: 180 tablet, Rfl: 4 .  MICROLET LANCETS MISC, USE AS DIRECTED ONCE DAILY, Disp: 100 each, Rfl: 3 .  MULTIPLE VITAMINS PO, Take 1 tablet by mouth daily., Disp: , Rfl:  .  nitroGLYCERIN (NITROSTAT) 0.4 MG SL tablet, Place 0.4 mg under the tongue every 5 (five) minutes as needed for chest pain. , Disp: , Rfl:  .  omeprazole (PRILOSEC) 20 MG capsule, Take 1 capsule (20 mg total) by mouth 2 (two) times daily before a meal. (Patient taking differently: Take 20 mg by mouth daily. ), Disp: 60 capsule, Rfl: 12 .  rosuvastatin (CRESTOR) 40 MG tablet, TAKE 1 TABLET(40 MG) BY MOUTH DAILY (Patient taking differently: Take 40 mg by mouth daily. ), Disp: 30 tablet, Rfl: 11 .  sertraline (ZOLOFT) 50 MG tablet, Take 1 tablet (50 mg total) by mouth daily., Disp: 90 tablet, Rfl: 3 .  tadalafil (CIALIS) 20 MG tablet, Take 1 tablet (20 mg total) by mouth every three (3) days as needed for erectile dysfunction., Disp: 30 tablet, Rfl: 5 .  TRESIBA FLEXTOUCH 100 UNIT/ML SOPN FlexTouch Pen, INJECT UNDER THE SKIN UP TO 30 UNITS DAILY EVERY DAY AS DIRECTED (Patient taking differently: Inject 26 Units into the skin daily. ), Disp: 12 pen, Rfl: 3 .  butalbital-acetaminophen-caffeine  (FIORICET) 50-325-40 MG tablet, Take 1 tablet by mouth every 8 (eight) hours as needed for headache. (Patient not taking: Reported on 12/14/2018), Disp: 30 tablet, Rfl: 3 .  celecoxib (CELEBREX) 200 MG capsule, TAKE 1 CAPSULE(200 MG) BY MOUTH DAILY AS NEEDED (Patient not taking: No sig reported), Disp: 90 capsule, Rfl: 3  Review of Systems  Constitutional: Negative for fever.  Musculoskeletal: Positive for myalgias and stiffness.  Skin: Negative for itching.  Neurological: Negative for numbness.    Social History   Tobacco Use  . Smoking status: Former Smoker    Packs/day: 2.00    Years: 8.00    Pack years: 16.00    Types: Cigarettes    Quit date: 02/08/1982    Years since quitting: 37.1  . Smokeless tobacco: Never Used  Substance Use Topics  . Alcohol use: No      Objective:   BP 130/86 (BP Location: Left Arm, Patient Position: Sitting, Cuff Size: Large)   Pulse 74   Temp (!) 96.6 F (35.9 C) (Temporal)   Resp 16   Wt 215 lb (97.5 kg)   BMI 30.85 kg/m  Vitals:   03/29/19 1006  BP: 130/86  Pulse: 74  Resp: 16  Temp: (!) 96.6 F (35.9 C)  TempSrc: Temporal  Weight: 215 lb (97.5 kg)  Body mass index is 30.85 kg/m.   Physical Exam Vitals reviewed.  Constitutional:      General: He is not in acute distress.    Appearance: Normal appearance.  HENT:     Head: Normocephalic and atraumatic.  Cardiovascular:     Rate and Rhythm: Normal rate and regular rhythm.  Pulmonary:     Effort: Pulmonary effort is normal. No respiratory distress.  Musculoskeletal:     Comments: R Shoulder: Inspection reveals no abnormalities, atrophy or asymmetry. Palpation with TTP over bicipital groove ROM decreased in internal rotation, abduction, and flexion Rotator cuff strength testing limited by pain with internal rotation and flexion + signs of impingement with positive Hawkin's tests No labral pathology noted with negative Obrien's, negative clunk and good stability.   Skin:     General: Skin is warm and dry.     Findings: No rash.  Neurological:     Mental Status: He is alert and oriented to person, place, and time. Mental status is at baseline.     Sensory: No sensory deficit.      No results found for any visits on 03/29/19.     Assessment & Plan    1. Acute pain of right shoulder - new problem x~3 months - worsening - on exam seems to have no labral pathology - has signs of impingement - concern about possible rotator cuff pathology, but it is difficult to determine if he really has weakness or just interfered with  pain - subacrominal corticosteroid injection today - XRay to assess degree of arthritis - f/u with PCP in one month to reassess rotator cuff function - ok to continue rest, ice, NSAIDs - HEP given - DG Shoulder Right; Future  Meds ordered this encounter  Medications  . methylPREDNISolone acetate (DEPO-MEDROL) injection 40 mg  . lidocaine (PF) (XYLOCAINE) 1 % injection 2 mL  . lidocaine (PF) (XYLOCAINE) 1 % injection 2 mL    INJECTION: Patient was given informed consent,. Appropriate time out was taken. Area prepped and draped in usual sterile fashion.  1 cc of depo-medrol 40 mg/ml plus 4 cc of 1% lidocaine without epinephrine was injected into the right shoulder using a(n) posterior subacromial approach. The patient tolerated the procedure well. There were no complications. Post procedure instructions were given.    Return in about 4 weeks (around 04/26/2019) for shoulder follow-up.   The entirety of the information documented in the History of Present Illness, Review of Systems and Physical Exam were personally obtained by me. Portions of this information were initially documented by Lynford Humphrey, CMA and reviewed by me for thoroughness and accuracy.    Aerionna Moravek, Dionne Bucy, MD MPH [facility]

## 2019-04-04 ENCOUNTER — Other Ambulatory Visit: Payer: Self-pay | Admitting: *Deleted

## 2019-04-04 DIAGNOSIS — E119 Type 2 diabetes mellitus without complications: Secondary | ICD-10-CM

## 2019-04-04 MED ORDER — TRULICITY 1.5 MG/0.5ML ~~LOC~~ SOAJ: 1.0000 "pen " | SUBCUTANEOUS | 11 refills | Status: DC

## 2019-04-17 ENCOUNTER — Ambulatory Visit: Payer: Self-pay | Admitting: Family Medicine

## 2019-04-20 NOTE — Progress Notes (Signed)
Trena Platt Cummings,acting as a scribe for [name], MD.,have documented all relevant documentation on the behalf of [name], MD,as directed by  [name], MD while in the presence of [name], MD.  Established patient visit      Patient: Samuel Gentry   DOB: Jun 17, 1955   64 y.o. Male  MRN: [id number] Visit Date: 04/25/2019  Today's healthcare provider: [name], MD  Subjective:    Chief Complaint  Patient presents with  . Diabetes Mellitus   HPI  Diabetes Mellitus Type II, Follow-up:   Lab Results  Component Value Date   HGBA1C 8.9 (A) 12/14/2018   HGBA1C 9.2 (H) 06/13/2018   HGBA1C 8.2 (H) 02/17/2018   Last seen for diabetes 4 months ago.  Management since then includes; Have limited options to change things as he has no insurance.  This should change next year.  On Trulicity, Metformin and Tresiba . He reports good compliance with treatment. He is not having side effects.  Current symptoms include none and have been stable. Home blood sugar records: Stable  Episodes of hypoglycemia? no   Current Insulin Regimen: Tresiba 26 units daily Most Recent Eye Exam: 01/2019 Weight trend: stable Prior visit with dietician: no Current diet: well balanced Current exercise: housecleaning and yard work  ------------------------------------------------------------------------    Hypertension, follow-up:  BP Readings from Last 3 Encounters:  03/29/19 130/86  12/14/18 120/70  08/18/18 120/77    He was last seen for hypertension 9 months ago.  BP at that visit was 120/77. Management since that visit includes; no changes.He reports excellent compliance with treatment. He is not having side effects.  He is exercising. He is adherent to low salt diet.   Outside blood pressures are good. He is experiencing none.  Patient denies chest pain, chest pressure/discomfort, claudication, fatigue, irregular heart beat, lower  extremity edema and syncope.   Cardiovascular risk factors include advanced age (older than 43 for men, 30 for women), diabetes mellitus, hypertension and male gender.  Use of agents associated with hypertension: none.   ------------------------------------------------------------------------    Lipid/Cholesterol, Follow-up:   Last seen for this 4 months ago.  Management since that visit includes; last checked 10/14/2017.  Last Lipid Panel:    Component Value Date/Time   CHOL 152 10/14/2017 0956   TRIG 170 (H) 10/14/2017 0956   HDL 34 (L) 10/14/2017 0956   CHOLHDL 4.5 10/14/2017 0956   CHOLHDL 5.3 (H) 10/07/2016 1033   LDLCALC 84 10/14/2017 0956   LDLCALC 121 (H) 10/07/2016 1033    He reports excellent compliance with treatment. He is not having side effects.   Wt Readings from Last 3 Encounters:  03/29/19 215 lb (97.5 kg)  12/14/18 219 lb 6.4 oz (99.5 kg)  08/18/18 219 lb (99.3 kg)    ------------------------------------------------------------------------  Erectile dysfunction, unspecified erectile dysfunction type From 12/14/2018-Patient is having no CAD symptoms. Cardiologist states that he is doing well. Try Cialis.  CAD in native artery From 12/14/2018-Stable, all risk factors treated. No angina.  OSA (obstructive sleep apnea) From 12/14/2018-On CPAP.       Medications: Outpatient Medications Prior to Visit  Medication Sig  . acetaminophen (TYLENOL) 500 MG tablet Take 1,000 mg by mouth every 6 (six) hours as needed for moderate pain or headache.  Marland Kitchen amLODipine (NORVASC) 5 MG tablet TAKE 1 TABLET BY MOUTH EVERY DAY  . aspirin 325 MG tablet Take 325 mg by mouth daily.   . BD PEN  NEEDLE NANO U/F 32G X 4 MM MISC USE AS DIRECTED WITH TRESIBA  . Blood Glucose Monitoring Suppl (CONTOUR NEXT MONITOR) w/Device KIT USE AS DIRECTED  . butalbital-acetaminophen-caffeine (FIORICET) 50-325-40 MG tablet Take 1 tablet by mouth every 8 (eight) hours as needed for  headache. (Patient not taking: Reported on 12/14/2018)  . celecoxib (CELEBREX) 200 MG capsule TAKE 1 CAPSULE(200 MG) BY MOUTH DAILY AS NEEDED (Patient not taking: No sig reported)  . CONTOUR NEXT TEST test strip CHECK SUGAR ONCE DAILY AS DIRECTED  . Dulaglutide (TRULICITY) 1.5 VE/7.2CN SOPN Inject 1 pen into the skin once a week.  . isosorbide mononitrate (IMDUR) 30 MG 24 hr tablet Take by mouth.  Marland Kitchen lisinopril (ZESTRIL) 20 MG tablet TAKE 1 TABLET(20 MG) BY MOUTH DAILY  . metFORMIN (GLUCOPHAGE) 1000 MG tablet TAKE 1 TABLET(1000 MG) BY MOUTH TWICE DAILY WITH A MEAL (Patient taking differently: Take 1,000 mg by mouth 2 (two) times a day. )  . MICROLET LANCETS MISC USE AS DIRECTED ONCE DAILY  . MULTIPLE VITAMINS PO Take 1 tablet by mouth daily.  . nitroGLYCERIN (NITROSTAT) 0.4 MG SL tablet Place 0.4 mg under the tongue every 5 (five) minutes as needed for chest pain.   Marland Kitchen omeprazole (PRILOSEC) 20 MG capsule Take 1 capsule (20 mg total) by mouth 2 (two) times daily before a meal. (Patient taking differently: Take 20 mg by mouth daily. )  . rosuvastatin (CRESTOR) 40 MG tablet TAKE 1 TABLET(40 MG) BY MOUTH DAILY (Patient taking differently: Take 40 mg by mouth daily. )  . sertraline (ZOLOFT) 50 MG tablet Take 1 tablet (50 mg total) by mouth daily.  . tadalafil (CIALIS) 20 MG tablet Take 1 tablet (20 mg total) by mouth every three (3) days as needed for erectile dysfunction.  . TRESIBA FLEXTOUCH 100 UNIT/ML SOPN FlexTouch Pen INJECT UNDER THE SKIN UP TO 30 UNITS DAILY EVERY DAY AS DIRECTED (Patient taking differently: Inject 26 Units into the skin daily. )   No facility-administered medications prior to visit.    Review of Systems  Constitutional: Negative for appetite change, chills and fever.  HENT: Negative.   Eyes: Negative.   Respiratory: Negative for chest tightness, shortness of breath and wheezing.   Cardiovascular: Negative for chest pain and palpitations.  Gastrointestinal: Negative for  abdominal pain, nausea and vomiting.  Endocrine: Negative.   Musculoskeletal: Positive for arthralgias.       Ongoing right shoulder pain with any movement of the shoulder.  No relief from cortisone shot.  Allergic/Immunologic: Negative.   Neurological: Negative.   Psychiatric/Behavioral: Negative.     Last hemoglobin A1c Lab Results  Component Value Date   HGBA1C 9.6 (A) 04/25/2019        Objective:    There were no vitals taken for this visit. BP Readings from Last 3 Encounters:  04/25/19 132/71  03/29/19 130/86  12/14/18 120/70   Wt Readings from Last 3 Encounters:  04/25/19 215 lb (97.5 kg)  03/29/19 215 lb (97.5 kg)  12/14/18 219 lb 6.4 oz (99.5 kg)      Physical Exam Vitals reviewed.  Constitutional:      Appearance: He is well-developed.  HENT:     Head: Normocephalic and atraumatic.     Right Ear: External ear normal.     Left Ear: External ear normal.  Eyes:     General: No scleral icterus.    Conjunctiva/sclera: Conjunctivae normal.  Neck:     Thyroid: No thyromegaly.  Cardiovascular:  Rate and Rhythm: Normal rate and regular rhythm.     Heart sounds: Normal heart sounds.  Pulmonary:     Effort: Pulmonary effort is normal.     Breath sounds: Normal breath sounds.  Abdominal:     Palpations: Abdomen is soft.  Genitourinary:    Penis: Normal.      Testes: Normal.  Musculoskeletal:     Comments: He has decreased range of motion and pain with abduction of the right shoulder and some anterior tenderness.  Skin:    General: Skin is warm and dry.  Neurological:     General: No focal deficit present.     Mental Status: He is alert and oriented to person, place, and time.     Comments: Grip strength is decreased in both hands because of soreness I think in his hands but strength is normal in both upper extremities  Psychiatric:        Behavior: Behavior normal.        Thought Content: Thought content normal.        Judgment: Judgment normal.        No results found for any visits on 04/25/19.    Assessment & Plan:    1. Type 2 diabetes mellitus with other circulatory complication, without long-term current use of insulin (HCC) A1c is 9.6 today.  Increase Tresiba from 26 to 28 units.  Slight increase in the last visit of 8.9 could be from the steroid injection he had in his shoulder a few weeks ago. - POCT HgB A1C  2. Arthropathy of shoulder region Refer to orthopedics as this is really bothering him.  Moderate arthritis noted on x-ray. - Ambulatory referral to Orthopedic Surgery  3. Coronary artery disease of native artery of native heart with stable angina pectoris ([place]) All risk factors treated  4. OSA (obstructive sleep apnea)   5. Mixed hyperlipidemia On statin.  I will see him back this summer for physical.      [name], MD  [facility] [phone] (phone) [phone] (fax)  [place]

## 2019-04-25 ENCOUNTER — Encounter: Payer: Self-pay | Admitting: Family Medicine

## 2019-04-25 ENCOUNTER — Other Ambulatory Visit: Payer: Self-pay

## 2019-04-25 ENCOUNTER — Ambulatory Visit (INDEPENDENT_AMBULATORY_CARE_PROVIDER_SITE_OTHER): Payer: Self-pay | Admitting: Family Medicine

## 2019-04-25 VITALS — BP 132/71 | HR 88 | Temp 97.3°F | Ht 70.0 in | Wt 215.0 lb

## 2019-04-25 DIAGNOSIS — M19019 Primary osteoarthritis, unspecified shoulder: Secondary | ICD-10-CM

## 2019-04-25 DIAGNOSIS — E1159 Type 2 diabetes mellitus with other circulatory complications: Secondary | ICD-10-CM

## 2019-04-25 DIAGNOSIS — I25118 Atherosclerotic heart disease of native coronary artery with other forms of angina pectoris: Secondary | ICD-10-CM

## 2019-04-25 DIAGNOSIS — G4733 Obstructive sleep apnea (adult) (pediatric): Secondary | ICD-10-CM

## 2019-04-25 DIAGNOSIS — E782 Mixed hyperlipidemia: Secondary | ICD-10-CM

## 2019-04-25 LAB — POCT GLYCOSYLATED HEMOGLOBIN (HGB A1C)
Estimated Average Glucose: 229
Hemoglobin A1C: 9.6 % — AB (ref 4.0–5.6)

## 2019-04-25 NOTE — Patient Instructions (Signed)
Increase Tresiba from 26 to 28U per day!!

## 2019-05-18 ENCOUNTER — Other Ambulatory Visit: Payer: Self-pay | Admitting: Family Medicine

## 2019-05-18 DIAGNOSIS — I10 Essential (primary) hypertension: Secondary | ICD-10-CM

## 2019-06-09 ENCOUNTER — Other Ambulatory Visit: Payer: Self-pay | Admitting: Family Medicine

## 2019-06-09 DIAGNOSIS — E782 Mixed hyperlipidemia: Secondary | ICD-10-CM

## 2019-06-09 MED ORDER — ROSUVASTATIN CALCIUM 40 MG PO TABS: ORAL_TABLET | ORAL | 11 refills | Status: DC

## 2019-06-09 NOTE — Telephone Encounter (Signed)
[facility] faxed refill request for the following medications:   rosuvastatin (CRESTOR) 40 MG tablet   Please advise.  Thanks, TGH 

## 2019-06-17 ENCOUNTER — Other Ambulatory Visit: Payer: Self-pay | Admitting: Family Medicine

## 2019-06-17 DIAGNOSIS — E119 Type 2 diabetes mellitus without complications: Secondary | ICD-10-CM

## 2019-06-17 NOTE — Telephone Encounter (Signed)
Requested Prescriptions  Pending Prescriptions Disp Refills  . TRESIBA FLEXTOUCH 100 UNIT/ML FlexTouch Pen [Pharmacy Med Name: TRESIBA FLEXTOUCH PEN ([id number]] 30 mL 1    Sig: INJECT UNDER THE SKIN UP TO 30 UNITS DAILY AS DIRECTED     Endocrinology:  Diabetes - Insulins Failed - 06/17/2019 11:34 AM      Failed - HBA1C is between 0 and 7.9 and within 180 days    Hemoglobin A1C  Date Value Ref Range Status  04/25/2019 9.6 (A) 4.0 - 5.6 % Final   Hgb A1c MFr Bld  Date Value Ref Range Status  06/13/2018 9.2 (H) 4.8 - 5.6 % Final    Comment:             Prediabetes: 5.7 - 6.4          Diabetes: >6.4          Glycemic control for adults with diabetes: <7.0          Passed - Valid encounter within last 6 months    Recent Outpatient Visits          1 month ago Type 2 diabetes mellitus with other circulatory complication, without long-term current use of insulin [facility])   [facility] Maple Hudson., MD   2 months ago Acute pain of right shoulder   [facility] [place], Marzella Schlein, MD   6 months ago Type 2 diabetes mellitus with other circulatory complication, without long-term current use of insulin [facility])   [facility] Maple Hudson., MD   10 months ago Type 2 diabetes mellitus with other circulatory complication, without long-term current use of insulin [facility])   [facility] Maple Hudson., MD   12 months ago Nonintractable headache, unspecified chronicity pattern, unspecified headache type   [facility] Maple Hudson., MD

## 2019-06-30 ENCOUNTER — Other Ambulatory Visit: Payer: Self-pay | Admitting: Family Medicine

## 2019-06-30 DIAGNOSIS — E119 Type 2 diabetes mellitus without complications: Secondary | ICD-10-CM

## 2019-08-08 ENCOUNTER — Other Ambulatory Visit: Payer: Self-pay | Admitting: Family Medicine

## 2019-08-29 ENCOUNTER — Encounter: Payer: Self-pay | Admitting: Family Medicine

## 2019-09-20 NOTE — Progress Notes (Signed)
[facility],acting as a scribe for [name], MD.,have documented all relevant documentation on the behalf of [name], MD,as directed by  [name], MD while in the presence of [name], MD.  Complete physical exam   Patient: Samuel Gentry   DOB: 1955-03-15   64 y.o. Male  MRN: [id number] Visit Date: 09/21/2019  Today's healthcare provider: [name], MD   Chief Complaint  Patient presents with  . Annual Exam   Subjective    Samuel Gentry is a 64 y.o. male who presents today for a complete physical exam.  He reports consuming a general diet. The patient does not participate in regular exercise at present. He generally feels well. He reports sleeping fairly well. He does not have additional problems to discuss today.  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 HPI    Past Medical History:  Diagnosis Date  . Anxiety   . Arthritis   . Coronary atherosclerosis of native coronary artery 11/19/2004   2 stents placed to the LAD in [place], [place].   . DDD (degenerative disc disease), lumbar   . GERD (gastroesophageal reflux disease)   . Other and unspecified hyperlipidemia   . Status post cardiac catheterization 2003   [facility]  . Type II or unspecified type diabetes mellitus without mention of complication, not stated as uncontrolled   . Unspecified essential hypertension   . Wears glasses    Past Surgical History:  Procedure Laterality Date  . ANKLE FUSION  2003   left ankle  . BACK SURGERY  93,97   lumb x2  . CARDIAC CATHETERIZATION  2003  . COLONOSCOPY WITH PROPOFOL N/A 11/06/2015   Procedure: COLONOSCOPY WITH PROPOFOL;  Surgeon: Robert Bellow, MD;  Location: [facility] ENDOSCOPY;  Service: Endoscopy;  Laterality: N/A;  . CORONARY ANGIOPLASTY  2006  . DORSAL COMPARTMENT RELEASE Left 12/29/2013   Procedure: LEFT WRIST RELEASE DORSAL COMPARTMENT (DEQUERVAIN);  Surgeon: Jolyn Nap, MD;  Location: [facility];  Service: Orthopedics;  Laterality: Left;  . ESOPHAGOGASTRODUODENOSCOPY (EGD) WITH PROPOFOL N/A 11/06/2015   Procedure: ESOPHAGOGASTRODUODENOSCOPY (EGD) WITH PROPOFOL;  Surgeon: Robert Bellow, MD;  Location: [facility] ENDOSCOPY;  Service: Endoscopy;  Laterality: N/A;  . KNEE SURGERY  1978   rt  . LEFT HEART CATH AND CORONARY ANGIOGRAPHY Left 07/12/2018   Procedure: LEFT HEART CATH AND CORONARY ANGIOGRAPHY;  Surgeon: Corey Skains, MD;  Location: [facility];  Service: Cardiovascular;  Laterality: Left;  . NECK SURGERY  2010   Social History   Socioeconomic History  . Marital status: Married    Spouse name: Not on file  . Number of children: Not on file  . Years of education: Not on file  . Highest education level: Not on file  Occupational History  . Not on file  Tobacco Use  . Smoking status: Former Smoker    Packs/day: 2.00    Years: 8.00    Pack years: 16.00    Types: Cigarettes    Quit date: 02/08/1982    Years since quitting: 37.6  . Smokeless tobacco: Never Used  Vaping Use  . Vaping Use: Never used  Substance and Sexual Activity  . Alcohol use: No  . Drug use: No  . Sexual activity: Not on file  Other Topics Concern  . Not on file  Social History Narrative  . Not on file   Social Determinants of Health  Financial Resource Strain:   . Difficulty of Paying Living Expenses: Not on file  Food Insecurity:   . Worried About Charity fundraiser in the Last Year: Not on file  . Ran Out of Food in the Last Year: Not on file  Transportation Needs:   . Lack of Transportation (Medical): Not on file  . Lack of Transportation (Non-Medical): Not on file  Physical Activity:   . Days of Exercise per Week: Not on file  . Minutes of Exercise per Session: Not on file  Stress:   . Feeling of Stress : Not on file  Social Connections:   . Frequency of Communication with Friends and Family: Not on file  . Frequency  of Social Gatherings with Friends and Family: Not on file  . Attends Religious Services: Not on file  . Active Member of Clubs or Organizations: Not on file  . Attends Archivist Meetings: Not on file  . Marital Status: Not on file  Intimate Partner Violence:   . Fear of Current or Ex-Partner: Not on file  . Emotionally Abused: Not on file  . Physically Abused: Not on file  . Sexually Abused: Not on file   Family Status  Relation Name Status  . Mother  Alive  . Father  Deceased       diabetes  . Brother  Alive   Family History  Problem Relation Age of Onset  . Hyperlipidemia Mother   . Diabetes Father   . Prostate cancer Father    Allergies  Allergen Reactions  . Prednisone Other (See Comments)    Makes him very mean    Patient Care Team: Jerrol Banana., MD as PCP - General (Unknown Physician Specialty) Minna Merritts, MD as Consulting Physician (Cardiology) Jerrol Banana., MD (Family Medicine) Bary Castilla, Forest Gleason, MD (General Surgery) Cathi Roan, [facility] (Pharmacist)   Medications: Outpatient Medications Prior to Visit  Medication Sig  . amLODipine (NORVASC) 5 MG tablet TAKE 1 TABLET BY MOUTH EVERY DAY  . aspirin 325 MG tablet Take 325 mg by mouth daily.   . BD PEN NEEDLE NANO 2ND GEN 32G X 4 MM MISC USE AS DIRECTED WITH TRESIBA EVERY DAY  . Blood Glucose Monitoring Suppl (CONTOUR NEXT MONITOR) w/Device KIT USE AS DIRECTED  . CONTOUR NEXT TEST test strip CHECK SUGAR ONCE DAILY AS DIRECTED  . Dulaglutide (TRULICITY) 1.5 UK/0.2RK SOPN Inject 1 pen into the skin once a week.  Marland Kitchen lisinopril (ZESTRIL) 20 MG tablet TAKE 1 TABLET(20 MG) BY MOUTH DAILY  . metFORMIN (GLUCOPHAGE) 1000 MG tablet TAKE 1 TABLET BY MOUTH TWICE DAILY WITH MEALS  . MICROLET LANCETS MISC USE AS DIRECTED ONCE DAILY  . MULTIPLE VITAMINS PO Take 1 tablet by mouth daily.  . nitroGLYCERIN (NITROSTAT) 0.4 MG SL tablet Place 0.4 mg under the tongue every 5 (five) minutes as  needed for chest pain.   Marland Kitchen omeprazole (PRILOSEC) 20 MG capsule Take 1 capsule (20 mg total) by mouth 2 (two) times daily before a meal. (Patient taking differently: Take 20 mg by mouth daily. )  . rosuvastatin (CRESTOR) 40 MG tablet TAKE 1 TABLET(40 MG) BY MOUTH DAILY  . sertraline (ZOLOFT) 50 MG tablet Take 1 tablet (50 mg total) by mouth daily.  . tadalafil (CIALIS) 20 MG tablet Take 1 tablet (20 mg total) by mouth every three (3) days as needed for erectile dysfunction.  . TRESIBA FLEXTOUCH 100 UNIT/ML FlexTouch Pen INJECT UNDER THE SKIN  UP TO 30 UNITS DAILY AS DIRECTED  . acetaminophen (TYLENOL) 500 MG tablet Take 1,000 mg by mouth every 6 (six) hours as needed for moderate pain or headache. (Patient not taking: Reported on 09/21/2019)  . celecoxib (CELEBREX) 200 MG capsule TAKE 1 CAPSULE(200 MG) BY MOUTH DAILY AS NEEDED (Patient not taking: No sig reported)  . isosorbide mononitrate (IMDUR) 30 MG 24 hr tablet Take by mouth.   No facility-administered medications prior to visit.    Review of Systems  Constitutional: Negative.   HENT: Positive for hearing loss.   Eyes: Negative.   Respiratory: Negative.   Cardiovascular: Negative.   Gastrointestinal: Negative.   Endocrine: Negative.   Genitourinary: Negative.   Musculoskeletal: Positive for arthralgias and joint swelling.  Skin: Negative.   Allergic/Immunologic: Negative.   Neurological: Negative.   Hematological: Negative.   Psychiatric/Behavioral: Negative.        Objective    BP 124/77 (BP Location: Right Arm, Patient Position: Sitting, Cuff Size: Large)   Pulse 71   Temp 97.8 F (36.6 C) (Oral)   Ht 5\' 10"  (1.778 m)   Wt 212 lb 9.6 oz (96.4 kg)   BMI 30.50 kg/m  BP Readings from Last 3 Encounters:  09/21/19 124/77  04/25/19 132/71  03/29/19 130/86   Wt Readings from Last 3 Encounters:  09/21/19 212 lb 9.6 oz (96.4 kg)  04/25/19 215 lb (97.5 kg)  03/29/19 215 lb (97.5 kg)      Physical Exam Vitals reviewed.    Constitutional:      Appearance: Normal appearance.  HENT:     Head: Normocephalic and atraumatic.     Right Ear: External ear normal.     Left Ear: External ear normal.  Eyes:     General: No scleral icterus.    Conjunctiva/sclera: Conjunctivae normal.  Cardiovascular:     Rate and Rhythm: Normal rate and regular rhythm.     Pulses: Normal pulses.     Heart sounds: Normal heart sounds.  Pulmonary:     Effort: Pulmonary effort is normal.     Breath sounds: Normal breath sounds.  Genitourinary:    Penis: Normal.      Testes: Normal.     Prostate: Normal.     Rectum: Normal.  Musculoskeletal:     Right lower leg: No edema.     Left lower leg: No edema.  Skin:    General: Skin is warm and dry.  Neurological:     General: No focal deficit present.     Mental Status: He is alert and oriented to person, place, and time.  Psychiatric:        Mood and Affect: Mood normal.        Behavior: Behavior normal.        Thought Content: Thought content normal.        Judgment: Judgment normal.       Last depression screening scores PHQ 2/9 Scores 08/18/2018 10/14/2017 10/07/2016  PHQ - 2 Score 1 0 2  PHQ- 9 Score 3 0 3   Last fall risk screening Fall Risk  08/18/2018  Falls in the past year? 0  Follow up Falls evaluation completed   Last Audit-C alcohol use screening Alcohol Use Disorder Test (AUDIT) 08/18/2018  1. How often do you have a drink containing alcohol? 0  2. How many drinks containing alcohol do you have on a typical day when you are drinking? -  3. How often do you have six or more  drinks on one occasion? 0  AUDIT-C Score -   A score of 3 or more in women, and 4 or more in men indicates increased risk for alcohol abuse, EXCEPT if all of the points are from question 1   No results found for any visits on 09/21/19.  Assessment & Plan    Routine Health Maintenance and Physical Exam  Exercise Activities and Dietary recommendations Goals    .  "I need help affording  my medications" (pt-stated)      Pharmacist Clinical Goal(s): Over the next 10 days, Mr.. Gombos will provide the necessary supplementary documents (proof of out of pocket prescription expenditure, proof of household income) needed for medication assistance applications to [facility] pharmacist.   Interventions:  [facility] pharmacist will investigate different medication assistance foundations and programs for Mr. Netherton to apply for, primarily for [place] and [place].   Partially completed: 02/21/2018, patient has been approved for medication assistance for Trulicity and [place]var[place], expires 01 28 2021  Updated 06/20/18: [facility] pharmacist completed refill, copy uploaded under media tab.  As of 02/28/18, patient was denied for [place] assistance [facility]) for "over income"; contacted [name], Technical brewer for [employer], who recommended sending in denial letter for redetermination. Decision will be made within 2 weeks   03/14/18: Update from [employer], representative [place] on 02/28/18 provided incorrect information, patient needs to supply a financial hardship letter and an out of pocket test claim from the patient's pharmacy.   Current Barriers:  . No prescription insurance coverage . Not able to use manufacturer's coupons due to lack of commercial coverage  Patient Self Care Activities:  . Collaborate with [facility] pharmacist and provide necessary paperwork    Please see past updates related to this goal by clicking on the "Past Updates" button in the selected goal          Immunization History  Administered Date(s) Administered  . Influenza,inj,Quad PF,6+ Mos 11/25/2015, 10/07/2016, 10/14/2017  . Pneumococcal Polysaccharide-23 10/07/2016  . Tdap 01/02/2010    Health Maintenance  Topic Date Due  . COVID-19 Vaccine (1) Never done  . FOOT EXAM  10/07/2017  . INFLUENZA VACCINE  08/13/2019  . HEMOGLOBIN A1C  10/25/2019  . TETANUS/TDAP  01/03/2020  . OPHTHALMOLOGY EXAM  01/16/2020  .  COLONOSCOPY  11/05/2025  . PNEUMOCOCCAL POLYSACCHARIDE VACCINE AGE 31-64 HIGH RISK  Completed  . Hepatitis C Screening  Completed  . HIV Screening  Completed    Discussed health benefits of physical activity, and encouraged him to engage in regular exercise appropriate for his age and condition.  1. Annual physical exam   2. Type 2 diabetes mellitus with other circulatory complication, without long-term current use of insulin (HCC)  - CBC with Differential/Platelet - Comprehensive metabolic panel - Hemoglobin A1c  3. Other hyperlipidemia  - CBC with Differential/Platelet - Comprehensive metabolic panel - Lipid panel  4. Mixed hyperlipidemia  - CBC with Differential/Platelet - Comprehensive metabolic panel  5. Coronary artery disease of native artery of native heart with stable angina pectoris (HCC)  - CBC with Differential/Platelet - Comprehensive metabolic panel  6. Prostate cancer screening  - PSA  7. Flu vaccine need  - Flu Vaccine QUAD 6+ mos PF IM (Fluarix Quad PF) 8.Seronegative RA Failed MTX.  No follow-ups on file.     I, Megan Mans, MD, have reviewed all documentation for this visit. The documentation on 09/30/19 for the exam, diagnosis, procedures, and orders are all accurate and complete.    Richard Sullivan Lone  Brooke Bonito, [place] [phone] (phone) [phone] (fax)  [place]

## 2019-09-21 ENCOUNTER — Encounter: Payer: Self-pay | Admitting: Family Medicine

## 2019-09-21 ENCOUNTER — Other Ambulatory Visit: Payer: Self-pay

## 2019-09-21 ENCOUNTER — Ambulatory Visit (INDEPENDENT_AMBULATORY_CARE_PROVIDER_SITE_OTHER): Payer: Self-pay | Admitting: Family Medicine

## 2019-09-21 VITALS — BP 124/77 | HR 71 | Temp 97.8°F | Ht 70.0 in | Wt 212.6 lb

## 2019-09-21 DIAGNOSIS — Z23 Encounter for immunization: Secondary | ICD-10-CM

## 2019-09-21 DIAGNOSIS — M0600 Rheumatoid arthritis without rheumatoid factor, unspecified site: Secondary | ICD-10-CM

## 2019-09-21 DIAGNOSIS — Z0000 Encounter for general adult medical examination without abnormal findings: Secondary | ICD-10-CM

## 2019-09-21 DIAGNOSIS — E782 Mixed hyperlipidemia: Secondary | ICD-10-CM

## 2019-09-21 DIAGNOSIS — E1159 Type 2 diabetes mellitus with other circulatory complications: Secondary | ICD-10-CM

## 2019-09-21 DIAGNOSIS — Z125 Encounter for screening for malignant neoplasm of prostate: Secondary | ICD-10-CM

## 2019-09-21 DIAGNOSIS — E7849 Other hyperlipidemia: Secondary | ICD-10-CM

## 2019-09-21 DIAGNOSIS — I25118 Atherosclerotic heart disease of native coronary artery with other forms of angina pectoris: Secondary | ICD-10-CM

## 2019-09-22 LAB — CBC WITH DIFFERENTIAL/PLATELET
Basophils Absolute: 0 10*3/uL (ref 0.0–0.2)
Basos: 1 %
EOS (ABSOLUTE): 0.5 10*3/uL — ABNORMAL HIGH (ref 0.0–0.4)
Eos: 9 %
Hematocrit: 41.1 % (ref 37.5–51.0)
Hemoglobin: 13.9 g/dL (ref 13.0–17.7)
Immature Grans (Abs): 0 10*3/uL (ref 0.0–0.1)
Immature Granulocytes: 1 %
Lymphocytes Absolute: 1.5 10*3/uL (ref 0.7–3.1)
Lymphs: 29 %
MCH: 28 pg (ref 26.6–33.0)
MCHC: 33.8 g/dL (ref 31.5–35.7)
MCV: 83 fL (ref 79–97)
Monocytes Absolute: 0.5 10*3/uL (ref 0.1–0.9)
Monocytes: 9 %
Neutrophils Absolute: 2.7 10*3/uL (ref 1.4–7.0)
Neutrophils: 51 %
Platelets: 220 10*3/uL (ref 150–450)
RBC: 4.96 x10E6/uL (ref 4.14–5.80)
RDW: 14.2 % (ref 11.6–15.4)
WBC: 5.2 10*3/uL (ref 3.4–10.8)

## 2019-09-22 LAB — COMPREHENSIVE METABOLIC PANEL
ALT: 53 IU/L — ABNORMAL HIGH (ref 0–44)
AST: 48 IU/L — ABNORMAL HIGH (ref 0–40)
Albumin/Globulin Ratio: 1.7 (ref 1.2–2.2)
Albumin: 4.7 g/dL (ref 3.8–4.8)
Alkaline Phosphatase: 65 IU/L (ref 48–121)
BUN/Creatinine Ratio: 22 (ref 10–24)
BUN: 22 mg/dL (ref 8–27)
Bilirubin Total: 0.4 mg/dL (ref 0.0–1.2)
CO2: 19 mmol/L — ABNORMAL LOW (ref 20–29)
Calcium: 9.7 mg/dL (ref 8.6–10.2)
Chloride: 101 mmol/L (ref 96–106)
Creatinine, Ser: 0.98 mg/dL (ref 0.76–1.27)
GFR calc Af Amer: 94 mL/min/{1.73_m2} (ref >59)
GFR calc non Af Amer: 81 mL/min/{1.73_m2} (ref >59)
Globulin, Total: 2.8 g/dL (ref 1.5–4.5)
Glucose: 164 mg/dL — ABNORMAL HIGH (ref 65–99)
Potassium: 4.8 mmol/L (ref 3.5–5.2)
Sodium: 136 mmol/L (ref 134–144)
Total Protein: 7.5 g/dL (ref 6.0–8.5)

## 2019-09-22 LAB — LIPID PANEL
Chol/HDL Ratio: 4.1 ratio (ref 0.0–5.0)
Cholesterol, Total: 154 mg/dL (ref 100–199)
HDL: 38 mg/dL — ABNORMAL LOW (ref >39)
LDL Chol Calc (NIH): 95 mg/dL (ref 0–99)
Triglycerides: 118 mg/dL (ref 0–149)
VLDL Cholesterol Cal: 21 mg/dL (ref 5–40)

## 2019-09-22 LAB — PSA: Prostate Specific Ag, Serum: 2.4 ng/mL (ref 0.0–4.0)

## 2019-09-22 LAB — HEMOGLOBIN A1C
Est. average glucose Bld gHb Est-mCnc: 258 mg/dL
Hgb A1c MFr Bld: 10.6 % — ABNORMAL HIGH (ref 4.8–5.6)

## 2019-09-26 ENCOUNTER — Other Ambulatory Visit: Payer: Self-pay | Admitting: Family Medicine

## 2019-09-26 ENCOUNTER — Telehealth: Payer: Self-pay | Admitting: Family Medicine

## 2019-09-26 DIAGNOSIS — E119 Type 2 diabetes mellitus without complications: Secondary | ICD-10-CM

## 2019-09-26 NOTE — Telephone Encounter (Signed)
Medication Refill - Medication: OMIT never prescribed  Has the patient contacted their pharmacy? No. (Agent: If no, request that the patient contact the pharmacy for the refill.) (Agent: If yes, when and what did the pharmacy advise?)  Preferred Pharmacy (with phone number or street name): Not needed  Agent: Please be advised that RX refills may take up to 3 business days. We ask that you follow-up with your pharmacy.

## 2019-09-26 NOTE — Telephone Encounter (Signed)
Requested Prescriptions  °Pending Prescriptions Disp Refills  °• metFORMIN (GLUCOPHAGE) 1000 MG tablet [Pharmacy Med Name: METFORMIN 1000MG TABLETS] 180 tablet 0  °  Sig: TAKE 1 TABLET BY MOUTH TWICE DAILY WITH MEALS  °  ° Endocrinology:  Diabetes - Biguanides Failed - 09/26/2019 10:07 AM  °  °  Failed - HBA1C is between 0 and 7.9 and within 180 days  °  Hgb A1c MFr Bld  °Date Value Ref Range Status  °09/21/2019 10.6 (H) 4.8 - 5.6 % Final  °  Comment:  °           Prediabetes: 5.7 - 6.4 °         Diabetes: >6.4 °         Glycemic control for adults with diabetes: <7.0 °  °   °  °  Passed - Cr in normal range and within 360 days  °  Creat  °Date Value Ref Range Status  °10/07/2016 1.11 0.70 - 1.25 mg/dL Final  °  Comment:  °  For patients >49 years of age, the reference limit °for Creatinine is approximately 13% higher for people °identified as African-American. °. °  ° °Creatinine, Ser  °Date Value Ref Range Status  °09/21/2019 0.98 0.76 - 1.27 mg/dL Final  °   °  °  Passed - eGFR in normal range and within 360 days  °  GFR calc Af Amer  °Date Value Ref Range Status  °09/21/2019 94 >59 mL/min/1.73 Final  °  Comment:  °  **[facility] currently reports eGFR in compliance with the current** °  recommendations of the National Kidney Foundation. [facility] will °  update reporting as new guidelines are published from the NKF-ASN °  Task force. °  ° °GFR calc non Af Amer  °Date Value Ref Range Status  °09/21/2019 81 >59 mL/min/1.73 Final  °   °  °  Passed - Valid encounter within last 6 months  °  Recent Outpatient Visits   °      ° 5 days ago Type 2 diabetes mellitus with other circulatory complication, without long-term current use of insulin (HCC)  ° [facility] Gilbert, [name], MD  ° 5 months ago Type 2 diabetes mellitus with other circulatory complication, without long-term current use of insulin (HCC)  ° [facility] Gilbert, [name], MD  ° 6 months ago Acute pain of right shoulder   ° [facility] Bacigalupo, Angela M, MD  ° 9 months ago Type 2 diabetes mellitus with other circulatory complication, without long-term current use of insulin (HCC)  ° [facility] Gilbert, [name], MD  ° 1 year ago Type 2 diabetes mellitus with other circulatory complication, without long-term current use of insulin (HCC)  ° [facility] Gilbert, [name], MD  °  °  °Future Appointments   °        ° In 4 months Gilbert, [name], MD [facility]  °  ° °  °  °  ° ° °

## 2019-09-27 ENCOUNTER — Telehealth: Payer: Self-pay

## 2019-09-27 MED ORDER — TRAMADOL HCL 50 MG PO TABS: 50.0000 mg | ORAL_TABLET | Freq: Four times a day (QID) | ORAL | 2 refills | Status: DC | PRN

## 2019-09-27 NOTE — Telephone Encounter (Signed)
Duplicate message. 

## 2019-09-27 NOTE — Telephone Encounter (Signed)
Medication sent to patient's pharmacy.

## 2019-09-27 NOTE — Telephone Encounter (Signed)
Medication was filled

## 2019-09-27 NOTE — Telephone Encounter (Signed)
Copied from CRM [id number]. Topic: General - Inquiry >> Sep 26, 2019  4:58 PM Adrian Prince D wrote: Reason for CRM: Patient's wife called in about a prescription Dr. Sullivan Lone was calling in for Tramadol. He has went to the pharmacy and it wasn't there. Can you return the call to [phone]. Please advise

## 2019-10-01 ENCOUNTER — Other Ambulatory Visit: Payer: Self-pay | Admitting: Family Medicine

## 2019-11-22 ENCOUNTER — Telehealth: Payer: Self-pay

## 2019-11-22 NOTE — Telephone Encounter (Signed)
LMOVM for pt to return call 

## 2019-11-22 NOTE — Telephone Encounter (Signed)
Copied from CRM [id number]. Topic: General - Other >> Nov 22, 2019  2:18 PM Jaquita Rector A wrote: Reason for CRM: Patient wife Happy called to inform Dr Sullivan Lone that patient saw the [facility] who will not do surgery until the patient gets his A1c under control lower than an 8. Want to know if he need to come back in for a visit Please advise.  Asking for some recommendations Ph# [phone]

## 2019-11-22 NOTE — Telephone Encounter (Signed)
Patient returning nurses call and requesting callback

## 2019-11-22 NOTE — Telephone Encounter (Signed)
Use fasting blood sugars as the measure.  For every 2 days that fasting blood sugar is above 120 increase Tresiba by 2 units.  Continue this pattern and then see me back in early  December

## 2019-11-22 NOTE — Telephone Encounter (Signed)
Please advise 

## 2019-11-23 NOTE — Telephone Encounter (Signed)
Patient was advised and scheduled for ov 12/19/2019.

## 2019-12-19 ENCOUNTER — Encounter: Payer: Self-pay | Admitting: Family Medicine

## 2019-12-19 ENCOUNTER — Other Ambulatory Visit: Payer: Self-pay

## 2019-12-19 ENCOUNTER — Ambulatory Visit (INDEPENDENT_AMBULATORY_CARE_PROVIDER_SITE_OTHER): Payer: Self-pay | Admitting: Family Medicine

## 2019-12-19 VITALS — BP 137/92 | HR 70 | Temp 98.2°F | Resp 16 | Ht 70.0 in | Wt 221.0 lb

## 2019-12-19 DIAGNOSIS — M25511 Pain in right shoulder: Secondary | ICD-10-CM

## 2019-12-19 DIAGNOSIS — I2510 Atherosclerotic heart disease of native coronary artery without angina pectoris: Secondary | ICD-10-CM

## 2019-12-19 DIAGNOSIS — E1159 Type 2 diabetes mellitus with other circulatory complications: Secondary | ICD-10-CM

## 2019-12-19 DIAGNOSIS — M25512 Pain in left shoulder: Secondary | ICD-10-CM

## 2019-12-19 DIAGNOSIS — G8929 Other chronic pain: Secondary | ICD-10-CM

## 2019-12-19 NOTE — Patient Instructions (Addendum)
Increase Tresiba to 42 units. Also try over-the-counter CBD oil.

## 2019-12-19 NOTE — Progress Notes (Signed)
I,[name],acting as a scribe for [name], MD.,have documented all relevant documentation on the behalf of [name], MD,as directed by  [name], MD while in the presence of [name], MD.  Established patient visit   Patient: Samuel Gentry   DOB: Jun 30, 1955   64 y.o. Male  MRN: [id number] Visit Date: 12/19/2019  Today's healthcare provider: [name], MD   Chief Complaint  Patient presents with  . Diabetes  . Follow-up  . Hypertension   Subjective    HPI  Patient needs bilateral shoulder surgery.  He is trying to get his A1c below 8 to accomplish this. His diabetes medicines are very expensive and he has no insurance. Diabetes Mellitus Type II, follow-up  Lab Results  Component Value Date   HGBA1C 10.6 (H) 09/21/2019   HGBA1C 9.6 (A) 04/25/2019   HGBA1C 8.9 (A) 12/14/2018   Last seen for diabetes 3 months ago.  Management since then includes; Labs stable but diabetes slightly worse. Work on diet and exercise but would increase Tresiba insulin by 2 units per shot. He reports good compliance with treatment. He is not having side effects. none  Home blood sugar records: fasting range: 150  Episodes of hypoglycemia? No none   Current insulin regiment: Tresiba Most Recent Eye Exam: 01/16/19  --------------------------------------------------------------------  Hypertension, follow-up  BP Readings from Last 3 Encounters:  12/19/19 (!) 137/92  09/21/19 124/77  04/25/19 132/71   Wt Readings from Last 3 Encounters:  12/19/19 221 lb (100.2 kg)  09/21/19 212 lb 9.6 oz (96.4 kg)  04/25/19 215 lb (97.5 kg)     He was last seen for hypertension 3 months ago.  BP at that visit was 124/77. Management since that visit includes; on Imdur and lisinopril. He reports good compliance with treatment. He is not having side effects. none He is not exercising. He is adherent to low salt diet.   Outside blood pressures  are not checking.  He does not smoke.  Use of agents associated with hypertension: none.   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        Medications: Outpatient Medications Prior to Visit  Medication Sig  . acetaminophen (TYLENOL) 500 MG tablet Take 1,000 mg by mouth every 6 (six) hours as needed for moderate pain or headache.   Marland Kitchen amLODipine (NORVASC) 5 MG tablet TAKE 1 TABLET BY MOUTH EVERY DAY  . aspirin 325 MG tablet Take 325 mg by mouth daily.   . BD PEN NEEDLE NANO 2ND GEN 32G X 4 MM MISC USE AS DIRECTED WITH TRESIBA EVERY DAY  . Blood Glucose Monitoring Suppl (CONTOUR NEXT MONITOR) w/Device KIT USE AS DIRECTED  . CONTOUR NEXT TEST test strip CHECK SUGAR ONCE DAILY AS DIRECTED  . lisinopril (ZESTRIL) 20 MG tablet TAKE 1 TABLET(20 MG) BY MOUTH DAILY  . metFORMIN (GLUCOPHAGE) 1000 MG tablet TAKE 1 TABLET BY MOUTH TWICE DAILY WITH MEALS  . MICROLET LANCETS MISC USE AS DIRECTED ONCE DAILY  . MULTIPLE VITAMINS PO Take 1 tablet by mouth daily.  Marland Kitchen omeprazole (PRILOSEC) 20 MG capsule Take 1 capsule (20 mg total) by mouth 2 (two) times daily before a meal. (Patient taking differently: Take 20 mg by mouth daily. )  . sertraline (ZOLOFT) 50 MG tablet Take 1 tablet (50 mg total) by mouth daily.  . tadalafil (CIALIS) 20 MG tablet Take 1 tablet (20 mg total) by mouth every three (3) days as needed for  erectile dysfunction.  . traMADol (ULTRAM) 50 MG tablet Take 1 tablet (50 mg total) by mouth every 6 (six) hours as needed.  . TRESIBA FLEXTOUCH 100 UNIT/ML FlexTouch Pen INJECT UNDER THE SKIN UP TO 30 UNITS DAILY AS DIRECTED  . celecoxib (CELEBREX) 200 MG capsule TAKE 1 CAPSULE(200 MG) BY MOUTH DAILY AS NEEDED (Patient not taking: No sig reported)  . Dulaglutide (TRULICITY) 1.5 IE/3.3IR SOPN Inject 1 pen into the skin once a week. (Patient not taking: Reported on 12/19/2019)  . isosorbide mononitrate (IMDUR) 30 MG 24 hr  tablet Take by mouth.  . nitroGLYCERIN (NITROSTAT) 0.4 MG SL tablet Place 0.4 mg under the tongue every 5 (five) minutes as needed for chest pain.  (Patient not taking: Reported on 12/19/2019)  . rosuvastatin (CRESTOR) 40 MG tablet TAKE 1 TABLET(40 MG) BY MOUTH DAILY   No facility-administered medications prior to visit.    Review of Systems  Constitutional: Negative for appetite change, chills and fever.  Respiratory: Negative for chest tightness, shortness of breath and wheezing.   Cardiovascular: Negative for chest pain and palpitations.  Gastrointestinal: Negative for abdominal pain, nausea and vomiting.    Last hemoglobin A1c Lab Results  Component Value Date   HGBA1C 10.6 (H) 09/21/2019      Objective    BP (!) 137/92 (BP Location: Right Arm, Patient Position: Sitting, Cuff Size: Large)   Pulse 70   Temp 98.2 F (36.8 C) (Oral)   Resp 16   Ht _0  (1.778 m)   Wt 221 lb (100.2 kg)   SpO2 100%   BMI 31.71 kg/m  BP Readings from Last 3 Encounters:  12/19/19 (!) 137/92  09/21/19 124/77  04/25/19 132/71   Wt Readings from Last 3 Encounters:  12/19/19 221 lb (100.2 kg)  09/21/19 212 lb 9.6 oz (96.4 kg)  04/25/19 215 lb (97.5 kg)      Physical Exam Vitals reviewed.  Constitutional:      Appearance: He is well-developed.  HENT:     Head: Normocephalic and atraumatic.     Right Ear: External ear normal.     Left Ear: External ear normal.  Eyes:     General: No scleral icterus.    Conjunctiva/sclera: Conjunctivae normal.  Neck:     Thyroid: No thyromegaly.  Cardiovascular:     Rate and Rhythm: Normal rate and regular rhythm.     Heart sounds: Normal heart sounds.  Pulmonary:     Effort: Pulmonary effort is normal.     Breath sounds: Normal breath sounds.  Abdominal:     Palpations: Abdomen is soft.  Genitourinary:    Penis: Normal.      Testes: Normal.  Musculoskeletal:     Comments: He has decreased range of motion and pain with abduction of the  right shoulder and some anterior tenderness.  Skin:    General: Skin is warm and dry.  Neurological:     General: No focal deficit present.     Mental Status: He is alert and oriented to person, place, and time.     Comments: Grip strength is decreased in both hands because of soreness I think in his hands but strength is normal in both upper extremities  Psychiatric:        Behavior: Behavior normal.        Thought Content: Thought content normal.        Judgment: Judgment normal.       No results found for any visits on 12/19/19.  Assessment & Plan     1. Type 2 diabetes mellitus with other circulatory complication, without long-term current use of insulin (HCC) A1c is 9.4 down from 10.6.  Goal will be less than 8 to get surgery.  Follow-up in 3 months.  Increase Tresiba to 42 units daily and go up by 2 units every few days if blood sugar fasting is not less than 150. - POCT glycosylated hemoglobin (Hb A1C)  2. CAD in native artery All risk factors treated.  3. Chronic pain of both shoulders Needs bilateral shoulder surgery.  Over-the-counter CBD oil.   No follow-ups on file.         Maryum Batterson Cranford Mon, MD  [facility] [phone] (phone) [phone] (fax)  [place]

## 2019-12-20 ENCOUNTER — Other Ambulatory Visit: Payer: Self-pay | Admitting: Family Medicine

## 2019-12-20 DIAGNOSIS — E119 Type 2 diabetes mellitus without complications: Secondary | ICD-10-CM

## 2019-12-24 ENCOUNTER — Other Ambulatory Visit: Payer: Self-pay | Admitting: Family Medicine

## 2019-12-24 DIAGNOSIS — E119 Type 2 diabetes mellitus without complications: Secondary | ICD-10-CM

## 2019-12-24 NOTE — Telephone Encounter (Signed)
Requested Prescriptions  Pending Prescriptions Disp Refills   TRESIBA FLEXTOUCH 100 UNIT/ML FlexTouch Pen [Pharmacy Med Name: TRESIBA FLEXTOUCH PEN ([id number]] 30 mL 1    Sig: ADMINISTER UP TO 30 UNITS UNDER THE SKIN DAILY AS DIRECTED     Endocrinology:  Diabetes - Insulins Failed - 12/24/2019  5:21 PM      Failed - HBA1C is between 0 and 7.9 and within 180 days    Hgb A1c MFr Bld  Date Value Ref Range Status  09/21/2019 10.6 (H) 4.8 - 5.6 % Final    Comment:             Prediabetes: 5.7 - 6.4          Diabetes: >6.4          Glycemic control for adults with diabetes: <7.0          Passed - Valid encounter within last 6 months    Recent Outpatient Visits          5 days ago Type 2 diabetes mellitus with other circulatory complication, without long-term current use of insulin [facility])   [facility] Maple Hudson., MD   3 months ago Annual physical exam   [facility] Maple Hudson., MD   8 months ago Type 2 diabetes mellitus with other circulatory complication, without long-term current use of insulin [facility])   [facility] Maple Hudson., MD   9 months ago Acute pain of right shoulder   [facility] Erasmo Downer, MD   1 year ago Type 2 diabetes mellitus with other circulatory complication, without long-term current use of insulin [facility])   [facility] Maple Hudson., MD      Future Appointments            In 2 months Maple Hudson., MD [facility]   In 2 months Maple Hudson., MD [facility]

## 2019-12-28 LAB — POCT GLYCOSYLATED HEMOGLOBIN (HGB A1C)
Est. average glucose Bld gHb Est-mCnc: 223
Hemoglobin A1C: 9.4 % — AB (ref 4.0–5.6)

## 2020-01-05 ENCOUNTER — Other Ambulatory Visit: Payer: Self-pay | Admitting: Family Medicine

## 2020-01-05 DIAGNOSIS — F320 Major depressive disorder, single episode, mild: Secondary | ICD-10-CM

## 2020-02-22 ENCOUNTER — Encounter: Payer: Self-pay | Admitting: Family Medicine

## 2020-02-22 ENCOUNTER — Other Ambulatory Visit: Payer: Self-pay

## 2020-02-22 ENCOUNTER — Ambulatory Visit (INDEPENDENT_AMBULATORY_CARE_PROVIDER_SITE_OTHER): Payer: Self-pay | Admitting: Family Medicine

## 2020-02-22 VITALS — BP 133/80 | HR 74 | Resp 16 | Wt 197.0 lb

## 2020-02-22 DIAGNOSIS — I10 Essential (primary) hypertension: Secondary | ICD-10-CM

## 2020-02-22 DIAGNOSIS — M25512 Pain in left shoulder: Secondary | ICD-10-CM

## 2020-02-22 DIAGNOSIS — E1159 Type 2 diabetes mellitus with other circulatory complications: Secondary | ICD-10-CM

## 2020-02-22 DIAGNOSIS — G8929 Other chronic pain: Secondary | ICD-10-CM

## 2020-02-22 DIAGNOSIS — M25511 Pain in right shoulder: Secondary | ICD-10-CM

## 2020-02-22 LAB — POCT GLYCOSYLATED HEMOGLOBIN (HGB A1C)
Est. average glucose Bld gHb Est-mCnc: 163
Hemoglobin A1C: 7.3 % — AB (ref 4.0–5.6)

## 2020-02-22 NOTE — Patient Instructions (Signed)
Decrease Tresiba by 2 units if fasting blood sugars get below 100.

## 2020-02-22 NOTE — Progress Notes (Signed)
I,Roshena L Chambers,acting as a scribe for [name], MD.,have documented all relevant documentation on the behalf of [name], MD,as directed by  [name], MD while in the presence of [name], MD.   Established patient visit   Patient: Samuel Gentry   DOB: Aug 09, 1955   65 y.o. Male  MRN: [id number] Visit Date: 02/22/2020  Today's healthcare provider: [name], MD   Chief Complaint  Patient presents with  . Diabetes  . Hypertension  . Hyperlipidemia   Subjective    HPI  Patient has been working very hard on diet and exercise and especially portion control and is lost 24 pounds since his last visit.  He wants to get his shoulder surgery done because it hurts all the time. He is having no chest pain shortness of breath dyspnea on exertion or any neurologic symptoms Diabetes Mellitus Type II, follow-up  Lab Results  Component Value Date   HGBA1C 7.3 (A) 02/22/2020   HGBA1C 9.4 (A) 12/28/2019   HGBA1C 10.6 (H) 09/21/2019   Last seen for diabetes 5 months ago.  Management since then includes; Goal will be less than 8 to get surgery.  Follow-up in 3 months.  Increase Tresiba to 42 units daily and go up by 2 units every few days if blood sugar fasting is not less than 150. He reports good compliance with treatment. He is not having side effects.   Home blood sugar records: fasting range: 125-135  Episodes of hypoglycemia? No    Current insulin regiment: Tresiba Most Recent Eye Exam: >1 year ago  --------------------------------------------------------------------------------------------------- Hypertension, follow-up  BP Readings from Last 3 Encounters:  02/22/20 133/80  12/19/19 (!) 137/92  09/21/19 124/77   Wt Readings from Last 3 Encounters:  02/22/20 197 lb (89.4 kg)  12/19/19 221 lb (100.2 kg)  09/21/19 212 lb 9.6 oz (96.4 kg)     He was last seen for hypertension 5 months ago.  BP at that visit was  124/77. Management since that visit includes; on amlodipine, lisinopril and isosorbide mononitrate. He reports good compliance with treatment. He is not having side effects.  He is exercising. He is not adherent to low salt diet.   Outside blood pressures are not checked.  He does not smoke.  Use of agents associated with hypertension: NSAIDS.   --------------------------------------------------------------------------------------------------- Lipid/Cholesterol, follow-up  Last Lipid Panel: Lab Results  Component Value Date   CHOL 154 09/21/2019   LDLCALC 95 09/21/2019   HDL 38 (L) 09/21/2019   TRIG 118 09/21/2019    He was last seen for this 5 months ago.  Management since that visit includes; on rosuvastatin.  He reports good compliance with treatment. He is not having side effects.  He is following a Regular diet. Current exercise: walking and yard work  Last metabolic panel Lab Results  Component Value Date   GLUCOSE 164 (H) 09/21/2019   NA 136 09/21/2019   K 4.8 09/21/2019   BUN 22 09/21/2019   CREATININE 0.98 09/21/2019   GFRNONAA 81 09/21/2019   GFRAA 94 09/21/2019   CALCIUM 9.7 09/21/2019   AST 48 (H) 09/21/2019   ALT 53 (H) 09/21/2019   The 10-year ASCVD risk score Mikey Bussing DC Jr., et al., 2013) is: 25.7%  ---------------------------------------------------------------------------------------------------  CAD in native artery From 09/21/2019-All risk factors treated.  Chronic pain of both shoulders From 09/21/2019-Needs bilateral shoulder surgery.  Over-the-counter CBD oil.       Medications: Outpatient Medications  Prior to Visit  Medication Sig  . amLODipine (NORVASC) 5 MG tablet TAKE 1 TABLET BY MOUTH EVERY DAY  . aspirin 325 MG tablet Take 325 mg by mouth daily.   . BD PEN NEEDLE NANO 2ND GEN 32G X 4 MM MISC USE AS DIRECTED WITH TRESIBA EVERY DAY  . Blood Glucose Monitoring Suppl (CONTOUR NEXT MONITOR) w/Device KIT USE AS DIRECTED  .  celecoxib (CELEBREX) 200 MG capsule TAKE 1 CAPSULE(200 MG) BY MOUTH DAILY AS NEEDED  . CONTOUR NEXT TEST test strip CHECK SUGAR ONCE DAILY AS DIRECTED  . metFORMIN (GLUCOPHAGE) 1000 MG tablet TAKE 1 TABLET BY MOUTH TWICE DAILY WITH MEALS  . MICROLET LANCETS MISC USE AS DIRECTED ONCE DAILY  . MULTIPLE VITAMINS PO Take 1 tablet by mouth daily.  . nitroGLYCERIN (NITROSTAT) 0.4 MG SL tablet Place 0.4 mg under the tongue every 5 (five) minutes as needed for chest pain.  Marland Kitchen omeprazole (PRILOSEC) 20 MG capsule Take 1 capsule (20 mg total) by mouth 2 (two) times daily before a meal. (Patient taking differently: Take 20 mg by mouth daily.)  . rosuvastatin (CRESTOR) 40 MG tablet TAKE 1 TABLET(40 MG) BY MOUTH DAILY  . sertraline (ZOLOFT) 50 MG tablet TAKE 1 TABLET(50 MG) BY MOUTH DAILY  . tadalafil (CIALIS) 20 MG tablet Take 1 tablet (20 mg total) by mouth every three (3) days as needed for erectile dysfunction.  . TRESIBA FLEXTOUCH 100 UNIT/ML FlexTouch Pen ADMINISTER UP TO 30 UNITS UNDER THE SKIN DAILY AS DIRECTED  . acetaminophen (TYLENOL) 500 MG tablet Take 1,000 mg by mouth every 6 (six) hours as needed for moderate pain or headache.  (Patient not taking: Reported on 02/22/2020)  . isosorbide mononitrate (IMDUR) 30 MG 24 hr tablet Take by mouth.  Marland Kitchen lisinopril (ZESTRIL) 20 MG tablet TAKE 1 TABLET(20 MG) BY MOUTH DAILY (Patient not taking: Reported on 02/22/2020)  . traMADol (ULTRAM) 50 MG tablet Take 1 tablet (50 mg total) by mouth every 6 (six) hours as needed. (Patient not taking: Reported on 02/22/2020)  . [DISCONTINUED] Dulaglutide (TRULICITY) 1.5 XI/3.3AS SOPN Inject 1 pen into the skin once a week. (Patient not taking: No sig reported)   No facility-administered medications prior to visit.    Review of Systems  Constitutional: Negative for appetite change, chills and fever.  Respiratory: Negative for chest tightness, shortness of breath and wheezing.   Cardiovascular: Negative for chest pain and  palpitations.  Gastrointestinal: Negative for abdominal pain, nausea and vomiting.  Musculoskeletal: Positive for arthralgias (shoulder pain).    Last hemoglobin A1c Lab Results  Component Value Date   HGBA1C 7.3 (A) 02/22/2020       Objective    BP 133/80 (BP Location: Right Arm, Patient Position: Sitting, Cuff Size: Normal)   Pulse 74   Resp 16   Wt 197 lb (89.4 kg)   BMI 28.27 kg/m  BP Readings from Last 3 Encounters:  02/22/20 133/80  12/19/19 (!) 137/92  09/21/19 124/77   Wt Readings from Last 3 Encounters:  02/22/20 197 lb (89.4 kg)  12/19/19 221 lb (100.2 kg)  09/21/19 212 lb 9.6 oz (96.4 kg)       Physical Exam Vitals reviewed.  Constitutional:      Appearance: He is well-developed.  HENT:     Head: Normocephalic and atraumatic.     Right Ear: External ear normal.     Left Ear: External ear normal.  Eyes:     General: No scleral icterus.    Conjunctiva/sclera:  Conjunctivae normal.  Neck:     Thyroid: No thyromegaly.  Cardiovascular:     Rate and Rhythm: Normal rate and regular rhythm.     Heart sounds: Normal heart sounds.  Pulmonary:     Effort: Pulmonary effort is normal.     Breath sounds: Normal breath sounds.  Abdominal:     Palpations: Abdomen is soft.  Genitourinary:    Penis: Normal.      Testes: Normal.  Musculoskeletal:     Comments: He has decreased range of motion and pain with abduction of the right shoulder and some anterior tenderness.  Skin:    General: Skin is warm and dry.  Neurological:     General: No focal deficit present.     Mental Status: He is alert and oriented to person, place, and time.  Psychiatric:        Behavior: Behavior normal.        Thought Content: Thought content normal.        Judgment: Judgment normal.       Results for orders placed or performed in visit on 02/22/20  POCT HgB A1C  Result Value Ref Range   Hemoglobin A1C 7.3 (A) 4.0 - 5.6 %   Est. average glucose Bld gHb Est-mCnc 163      Assessment & Plan     1. Type 2 diabetes mellitus with other circulatory complication, without long-term current use of insulin (HCC) A1c much improved today is 7.3 with 24 pound weight loss. We will try to avoid hypoglycemia by slowly adjusting insulin down with blood sugar Improved. Patient advised to decrease Tresiba by 2 units if fasting blood sugars get below 100. Samples given today.  - POCT HgB A1C Follow-up this summer for welcome to Medicare visit. 2. Essential hypertension Stable. Continue same medications.   Return in about 6 months (around 08/21/2020) for Welcome to Medicare physical.     3.  Chronic shoulder pain Patient medically cleared for surgery today I, [name], MD, have reviewed all documentation for this visit. The documentation on 02/27/20 for the exam, diagnosis, procedures, and orders are all accurate and complete.    Vasiliy Mccarry Cranford Mon, MD  [facility] [phone] (phone) [phone] (fax)  [place]

## 2020-03-16 ENCOUNTER — Other Ambulatory Visit: Payer: Self-pay | Admitting: Family Medicine

## 2020-03-16 DIAGNOSIS — E119 Type 2 diabetes mellitus without complications: Secondary | ICD-10-CM

## 2020-03-16 NOTE — Telephone Encounter (Signed)
Requested Prescriptions  Pending Prescriptions Disp Refills  . metFORMIN (GLUCOPHAGE) 1000 MG tablet [Pharmacy Med Name: METFORMIN 1000MG TABLETS] 180 tablet 1    Sig: TAKE 1 TABLET BY MOUTH TWICE DAILY WITH MEALS     Endocrinology:  Diabetes - Biguanides Passed - 03/16/2020 11:53 AM      Passed - Cr in normal range and within 360 days    Creat  Date Value Ref Range Status  10/07/2016 1.11 0.70 - 1.25 mg/dL Final    Comment:    For patients >65 years of age, the reference limit for Creatinine is approximately 13% higher for people identified as African-American. .    Creatinine, Ser  Date Value Ref Range Status  09/21/2019 0.98 0.76 - 1.27 mg/dL Final         Passed - HBA1C is between 0 and 7.9 and within 180 days    Hemoglobin A1C  Date Value Ref Range Status  02/22/2020 7.3 (A) 4.0 - 5.6 % Final   Hgb A1c MFr Bld  Date Value Ref Range Status  09/21/2019 10.6 (H) 4.8 - 5.6 % Final    Comment:             Prediabetes: 5.7 - 6.4          Diabetes: >6.4          Glycemic control for adults with diabetes: <7.0          Passed - eGFR in normal range and within 360 days    GFR calc Af Amer  Date Value Ref Range Status  09/21/2019 94 >59 mL/min/1.73 Final    Comment:    **[facility] currently reports eGFR in compliance with the current**   recommendations of the [employer]. [facility] will   update reporting as new guidelines are published from the NKF-ASN   Task force.    GFR calc non Af Amer  Date Value Ref Range Status  09/21/2019 81 >59 mL/min/1.73 Final         Passed - Valid encounter within last 6 months    Recent Outpatient Visits          3 weeks ago Type 2 diabetes mellitus with other circulatory complication, without long-term current use of insulin [facility])   [facility] Jerrol Banana., MD   2 months ago Type 2 diabetes mellitus with other circulatory complication, without long-term current use of insulin [facility]   [facility] Jerrol Banana., MD   5 months ago Annual physical exam   [facility] Jerrol Banana., MD   10 months ago Type 2 diabetes mellitus with other circulatory complication, without long-term current use of insulin [facility])   [facility] Jerrol Banana., MD   11 months ago Acute pain of right shoulder   [facility] Virginia Crews, MD      Future Appointments            In 5 months Jerrol Banana., MD [facility] [place], [place]

## 2020-03-18 ENCOUNTER — Ambulatory Visit: Payer: Self-pay | Admitting: Family Medicine

## 2020-03-20 ENCOUNTER — Other Ambulatory Visit: Payer: Self-pay | Admitting: Orthopedic Surgery

## 2020-03-20 DIAGNOSIS — Z01811 Encounter for preprocedural respiratory examination: Secondary | ICD-10-CM

## 2020-03-26 LAB — HM DIABETES EYE EXAM

## 2020-03-27 ENCOUNTER — Encounter: Payer: Self-pay | Admitting: *Deleted

## 2020-04-22 NOTE — Progress Notes (Signed)
DUE TO COVID-19 ONLY ONE VISITOR IS ALLOWED TO COME WITH YOU AND STAY IN THE WAITING ROOM ONLY DURING PRE OP AND PROCEDURE DAY OF SURGERY. THE 1 VISITOR  MAY VISIT WITH YOU AFTER SURGERY IN YOUR PRIVATE ROOM DURING VISITING HOURS ONLY!  YOU NEED TO HAVE A COVID 19 TEST ON__4/18/2022 _____ @_______ , THIS TEST MUST BE DONE BEFORE SURGERY,  COVID TESTING SITE [address] [place] , IT IS ON THE RIGHT GOING OUT [address] APPROXIMATELY  2 MINUTES PAST [employer] ON THE RIGHT. ONCE YOUR COVID TEST IS COMPLETED,  PLEASE BEGIN THE QUARANTINE INSTRUCTIONS AS OUTLINED IN YOUR HANDOUT.                Samuel Gentry  04/22/2020   Your procedure is scheduled on:                   05/02/2020   Report to [facility] Main  Entrance   Report to admitting at     0515 AM     Call this number if you have problems the morning of surgery [phone]    REMEMBER: NO  SOLID FOOD CANDY OR GUM AFTER MIDNIGHT. CLEAR LIQUIDS UNTIL   0415am       . NOTHING BY MOUTH EXCEPT CLEAR LIQUIDS UNTIL     0415am   . PLEASE FINISH ENSURE DRINK PER SURGEON ORDER  WHICH NEEDS TO BE COMPLETED AT0415am       .      CLEAR LIQUID DIET   Foods Allowed                                                                    Coffee and tea, regular and decaf                            Fruit ices (not with fruit pulp)                                      Iced Popsicles                                    Carbonated beverages, regular and diet                                    Cranberry, grape and apple juices Sports drinks like Gatorade Lightly seasoned clear broth or consume(fat free) Sugar, honey syrup ___________________________________________________________________      BRUSH YOUR TEETH MORNING OF SURGERY AND RINSE YOUR MOUTH OUT, NO CHEWING GUM CANDY OR MINTS.     Take these medicines the morning of surgery with A SIP OF WATER:                   Zoloft, prilosec, amlodipine   DO NOT TAKE ANY DIABETIC MEDICATIONS DAY OF YOUR SURGERY  You may not have any metal on your body including hair pins and              piercings  Do not wear jewelry, make-up, lotions, powders or perfumes, deodorant             Do not wear nail polish on your fingernails.  Do not shave  48 hours prior to surgery.              Men may shave face and neck.   Do not bring valuables to the hospital. [place].  Contacts, dentures or bridgework may not be worn into surgery.  Leave suitcase in the car. After surgery it may be brought to your room.     Patients discharged the day of surgery will not be allowed to drive home. IF YOU ARE HAVING SURGERY AND GOING HOME THE SAME DAY, YOU MUST HAVE AN ADULT TO DRIVE YOU HOME AND BE WITH YOU FOR 24 HOURS. YOU MAY GO HOME BY TAXI OR UBER OR ORTHERWISE, BUT AN ADULT MUST ACCOMPANY YOU HOME AND STAY WITH YOU FOR 24 HOURS.  Name and phone number of your driver:  Special Instructions: N/A              Please read over the following fact sheets you were given: _____________________________________________________________________  [facility] - Preparing for Surgery Before surgery, you can play an important role.  Because skin is not sterile, your skin needs to be as free of germs as possible.  You can reduce the number of germs on your skin by washing with CHG (chlorahexidine gluconate) soap before surgery.  CHG is an antiseptic cleaner which kills germs and bonds with the skin to continue killing germs even after washing. Please DO NOT use if you have an allergy to CHG or antibacterial soaps.  If your skin becomes reddened/irritated stop using the CHG and inform your nurse when you arrive at Short Stay. Do not shave (including legs and underarms) for at least 48 hours prior to the first CHG shower.  You may shave your face/neck. Please follow these instructions carefully:  1.   Shower with CHG Soap the night before surgery and the  morning of Surgery.  2.  If you choose to wash your hair, wash your hair first as usual with your  normal  shampoo.  3.  After you shampoo, rinse your hair and body thoroughly to remove the  shampoo.                           4.  Use CHG as you would any other liquid soap.  You can apply chg directly  to the skin and wash                       Gently with a scrungie or clean washcloth.  5.  Apply the CHG Soap to your body ONLY FROM THE NECK DOWN.   Do not use on face/ open                           Wound or open sores. Avoid contact with eyes, ears mouth and genitals (private parts).  Wash face,  Genitals (private parts) with your normal soap.             6.  Wash thoroughly, paying special attention to the area where your surgery  will be performed.  7.  Thoroughly rinse your body with warm water from the neck down.  8.  DO NOT shower/wash with your normal soap after using and rinsing off  the CHG Soap.                9.  Pat yourself dry with a clean towel.            10.  Wear clean pajamas.            11.  Place clean sheets on your bed the night of your first shower and do not  sleep with pets. Day of Surgery : Do not apply any lotions/deodorants the morning of surgery.  Please wear clean clothes to the hospital/surgery center.  FAILURE TO FOLLOW THESE INSTRUCTIONS MAY RESULT IN THE CANCELLATION OF YOUR SURGERY PATIENT SIGNATURE_________________________________  NURSE SIGNATURE__________________________________  ________________________________________________________________________             [facility]- Preparing for Total Shoulder Arthroplasty    Before surgery, you can play an important role. Because skin is not sterile, your skin needs to be as free of germs as possible. You can reduce the number of germs on your skin by using the following products. . Benzoyl Peroxide Gel o Reduces the number of  germs present on the skin o Applied twice a day to shoulder area starting two days before surgery    ==================================================================  Please follow these instructions carefully:  BENZOYL PEROXIDE 5% GEL  Please do not use if you have an allergy to benzoyl peroxide.   If your skin becomes reddened/irritated stop using the benzoyl peroxide.  Starting two days before surgery, apply as follows: 1. Apply benzoyl peroxide in the morning and at night. Apply after taking a shower. If you are not taking a shower clean entire shoulder front, back, and side along with the armpit with a clean wet washcloth.  2. Place a quarter-sized dollop on your shoulder and rub in thoroughly, making sure to cover the front, back, and side of your shoulder, along with the armpit.   2 days before ____ AM   ____ PM              1 day before ____ AM   ____ PM                         3. Do this twice a day for two days.  (Last application is the night before surgery, AFTER using the CHG soap as described below).  4. Do NOT apply benzoyl peroxide gel on the day of surgery.

## 2020-04-25 ENCOUNTER — Encounter (HOSPITAL_COMMUNITY)
Admission: RE | Admit: 2020-04-25 | Discharge: 2020-04-25 | Disposition: A | Discharge status: Home / Self Care | Payer: Self-pay | Source: Ambulatory Visit | Attending: Orthopedic Surgery | Admitting: Orthopedic Surgery

## 2020-04-25 ENCOUNTER — Encounter (HOSPITAL_COMMUNITY): Payer: Self-pay

## 2020-04-25 ENCOUNTER — Ambulatory Visit (HOSPITAL_COMMUNITY)
Admission: RE | Admit: 2020-04-25 | Discharge: 2020-04-25 | Disposition: A | Discharge status: Home / Self Care | Payer: Self-pay | Source: Ambulatory Visit | Attending: Orthopedic Surgery | Admitting: Orthopedic Surgery

## 2020-04-25 ENCOUNTER — Other Ambulatory Visit: Payer: Self-pay

## 2020-04-25 ENCOUNTER — Other Ambulatory Visit: Payer: Self-pay | Admitting: Orthopedic Surgery

## 2020-04-25 DIAGNOSIS — Z01811 Encounter for preprocedural respiratory examination: Secondary | ICD-10-CM

## 2020-04-25 HISTORY — DX: Depression, unspecified: F32.A

## 2020-04-25 LAB — CBC WITH DIFFERENTIAL/PLATELET
Abs Immature Granulocytes: 0.03 10*3/uL (ref 0.00–0.07)
Basophils Absolute: 0 10*3/uL (ref 0.0–0.1)
Basophils Relative: 0 %
Eosinophils Absolute: 0.1 10*3/uL (ref 0.0–0.5)
Eosinophils Relative: 2 %
HCT: 39.7 % (ref 39.0–52.0)
Hemoglobin: 13.2 g/dL (ref 13.0–17.0)
Immature Granulocytes: 1 %
Lymphocytes Relative: 24 %
Lymphs Abs: 1.4 10*3/uL (ref 0.7–4.0)
MCH: 29.4 pg (ref 26.0–34.0)
MCHC: 33.2 g/dL (ref 30.0–36.0)
MCV: 88.4 fL (ref 80.0–100.0)
Monocytes Absolute: 0.4 10*3/uL (ref 0.1–1.0)
Monocytes Relative: 8 %
Neutro Abs: 3.7 10*3/uL (ref 1.7–7.7)
Neutrophils Relative %: 65 %
Platelets: 224 10*3/uL (ref 150–400)
RBC: 4.49 MIL/uL (ref 4.22–5.81)
RDW: 12.7 % (ref 11.5–15.5)
WBC: 5.7 10*3/uL (ref 4.0–10.5)
nRBC: 0 % (ref 0.0–0.2)

## 2020-04-25 LAB — HEMOGLOBIN A1C
Hgb A1c MFr Bld: 7.1 % — ABNORMAL HIGH (ref 4.8–5.6)
Mean Plasma Glucose: 157.07 mg/dL

## 2020-04-25 LAB — COMPREHENSIVE METABOLIC PANEL
ALT: 36 U/L (ref 0–44)
AST: 42 U/L — ABNORMAL HIGH (ref 15–41)
Albumin: 4.4 g/dL (ref 3.5–5.0)
Alkaline Phosphatase: 58 U/L (ref 38–126)
Anion gap: 7 (ref 5–15)
BUN: 25 mg/dL — ABNORMAL HIGH (ref 8–23)
CO2: 23 mmol/L (ref 22–32)
Calcium: 9.5 mg/dL (ref 8.9–10.3)
Chloride: 107 mmol/L (ref 98–111)
Creatinine, Ser: 0.89 mg/dL (ref 0.61–1.24)
GFR, Estimated: >60 mL/min (ref >60)
Glucose, Bld: 142 mg/dL — ABNORMAL HIGH (ref 70–99)
Potassium: 4.7 mmol/L (ref 3.5–5.1)
Sodium: 137 mmol/L (ref 135–145)
Total Bilirubin: 0.7 mg/dL (ref 0.3–1.2)
Total Protein: 7.7 g/dL (ref 6.5–8.1)

## 2020-04-25 LAB — URINALYSIS, ROUTINE W REFLEX MICROSCOPIC
Bilirubin Urine: NEGATIVE
Glucose, UA: NEGATIVE mg/dL
Hgb urine dipstick: NEGATIVE
Ketones, ur: 5 mg/dL — AB
Leukocytes,Ua: NEGATIVE
Nitrite: NEGATIVE
Protein, ur: NEGATIVE mg/dL
Specific Gravity, Urine: 1.019 (ref 1.005–1.030)
pH: 5 (ref 5.0–8.0)

## 2020-04-25 LAB — SURGICAL PCR SCREEN
MRSA, PCR: NEGATIVE
Staphylococcus aureus: NEGATIVE

## 2020-04-25 LAB — GLUCOSE, CAPILLARY: Glucose-Capillary: 143 mg/dL — ABNORMAL HIGH (ref 70–99)

## 2020-04-25 LAB — APTT: aPTT: 29 seconds (ref 24–36)

## 2020-04-25 NOTE — Progress Notes (Addendum)
Anesthesia Review:  PCP:  Dr Julieanne Manson- [place] 02/22/2020 Cardiologist : DR Gwen Pounds- [facility] 02/01/2020- cleared for surgery per pt  Chest x-ray : 04/25/20 EKG :120/22- [facility] to fax 12 leak ekg tracing  EKG on chart  Echo : Stress test: Cardiac Cath : 2020 Activity level: can do a fight of stairs without difficuolty  Sleep Study/ CPAP :none Fasting Blood Sugar :      / Checks Blood Sugar -- times a day:   Blood Thinner/ Instructions /Last Dose: ASA / Instructions/ Last Dose :  DM- tyope 2 - checks glucose once daily per pt  hgba1c-04/25/20-  7.1 325 mg Aspiring- pt stated at preop he was told to stop a week prior to surgery per pt  U/a done 04/25/20 routed to DR Ave Filter.

## 2020-04-29 ENCOUNTER — Other Ambulatory Visit (HOSPITAL_COMMUNITY)
Admission: RE | Admit: 2020-04-29 | Discharge: 2020-04-29 | Disposition: A | Discharge status: Home / Self Care | Payer: Self-pay | Source: Ambulatory Visit | Attending: Orthopedic Surgery | Admitting: Orthopedic Surgery

## 2020-04-29 DIAGNOSIS — Z20822 Contact with and (suspected) exposure to covid-19: Secondary | ICD-10-CM | POA: Insufficient documentation

## 2020-04-29 DIAGNOSIS — Z01812 Encounter for preprocedural laboratory examination: Secondary | ICD-10-CM | POA: Insufficient documentation

## 2020-04-29 LAB — SARS CORONAVIRUS 2 (TAT 6-24 HRS): SARS Coronavirus 2: NEGATIVE

## 2020-05-01 ENCOUNTER — Encounter (HOSPITAL_COMMUNITY): Payer: Self-pay

## 2020-05-01 LAB — TYPE AND SCREEN
ABO/RH(D): B NEG
Antibody Screen: NEGATIVE

## 2020-05-01 NOTE — Anesthesia Preprocedure Evaluation (Addendum)
Anesthesia Evaluation  Patient identified by MRN, date of birth, ID band Patient awake    Reviewed: Allergy & Precautions, NPO status , Patient's Chart, lab work & pertinent test results  Airway Mallampati: II  TM Distance: >3 FB     Dental  (+) Teeth Intact   Pulmonary sleep apnea , former smoker,    Pulmonary exam normal        Cardiovascular hypertension, Pt. on medications + CAD and + Cardiac Stents (2006 )   Rhythm:Regular Rate:Normal     Neuro/Psych Anxiety Depression negative neurological ROS     GI/Hepatic Neg liver ROS, GERD  Medicated,  Endo/Other  diabetes, Type 2, Oral Hypoglycemic Agents  Renal/GU negative Renal ROS  negative genitourinary   Musculoskeletal  (+) Arthritis , Osteoarthritis,    Abdominal (+)  Abdomen: soft. Bowel sounds: normal.  Peds  Hematology negative hematology ROS (+)   Anesthesia Other Findings   Reproductive/Obstetrics                           Anesthesia Physical Anesthesia Plan  ASA: III  Anesthesia Plan: General and Regional   Post-op Pain Management:  Regional for Post-op pain   Induction: Intravenous  PONV Risk Score and Plan: 2 and Ondansetron, Dexamethasone, Midazolam and Treatment may vary due to age or medical condition  Airway Management Planned: Mask and Oral ETT  Additional Equipment: None  Intra-op Plan:   Post-operative Plan: Extubation in OR  Informed Consent: I have reviewed the patients History and Physical, chart, labs and discussed the procedure including the risks, benefits and alternatives for the proposed anesthesia with the patient or authorized representative who has indicated his/her understanding and acceptance.     Dental advisory given  Plan Discussed with: CRNA  Anesthesia Plan Comments: (Lab Results      Component                Value               Date                      WBC                      5.7                  04/25/2020                HGB                      13.2                04/25/2020                HCT                      39.7                04/25/2020                MCV                      88.4                04/25/2020                PLT  224                 04/25/2020           Lab Results      Component                Value               Date                      NA                       137                 04/25/2020                K                        4.7                 04/25/2020                CO2                      23                  04/25/2020                GLUCOSE                  142 (H)             04/25/2020                BUN                      25 (H)              04/25/2020                CREATININE               0.89                04/25/2020                CALCIUM                  9.5                 04/25/2020                GFRNONAA                 >60                 04/25/2020                GFRAA                    94                  09/21/2019           See APP note by Joslyn Hy, FNP  Nuclear stress test 08/03/18 (care everywhere):  - Normal treadmill EKG without evidence of ischemia or arrhythmia  - Normal myocardial perfusion without evidence of myocardial ischemia   Cardiac cath 07/12/18:   Prox LAD lesion  is 60% stenosed.  Mid LAD lesion is 35% stenosed.  Ost Cx to Prox Cx lesion is 30% stenosed.  Ost RCA to Prox RCA lesion is 85% stenosed.  Prox RCA lesion is 75% stenosed.  Mid RCA lesion is 60% stenosed.  Mid LAD to Dist LAD lesion is 10% stenosed. )      Anesthesia Quick Evaluation

## 2020-05-01 NOTE — Progress Notes (Signed)
Anesthesia Chart Review:   Case: [id number] Date/Time: 05/02/20 0715   Procedure: TOTAL SHOULDER ARTHROPLASTY (Right Shoulder)   Anesthesia type: Choice   Pre-op diagnosis: RIGHT SHOULDER ENDSTAGE OSTEOARTHRITIS   Location: [facility] ROOM 07 / [facility]   Surgeons: Tania Ade, MD      DISCUSSION: Pt is 65 years old with hx CAD (DES to CX, OM1, and LAD x2 in 2006), HTN, DM   VS: BP 128/79   Pulse 67   Temp 36.8 C (Oral)   Resp 16   SpO2 99%    PROVIDERS: - PCP Is Jerrol Banana., MD  - Cardiologist is Serafina Royals, MD. Cleared pt for surgery at last office visit 02/01/20 (notes in care everywhere)    LABS: Labs reviewed: Acceptable for surgery. (all labs ordered are listed, but only abnormal results are displayed)  Labs Reviewed  COMPREHENSIVE METABOLIC PANEL - Abnormal; Notable for the following components:      Result Value   Glucose, Bld 142 (*)    BUN 25 (*)    AST 42 (*)    All other components within normal limits  HEMOGLOBIN A1C - Abnormal; Notable for the following components:   Hgb A1c MFr Bld 7.1 (*)    All other components within normal limits  GLUCOSE, CAPILLARY - Abnormal; Notable for the following components:   Glucose-Capillary 143 (*)    All other components within normal limits  URINALYSIS, ROUTINE W REFLEX MICROSCOPIC - Abnormal; Notable for the following components:   Ketones, ur 5 (*)    All other components within normal limits  SURGICAL PCR SCREEN  CBC WITH DIFFERENTIAL/PLATELET  APTT  TYPE AND SCREEN     IMAGES: CXR 04/25/20: No acute cardiopulmonary process   EKG 02/01/20 (Dr. Alveria Apley office): NSR   CV: Nuclear stress test 08/03/18 (care everywhere):  - Normal treadmill EKG without evidence of ischemia or arrhythmia  - Normal myocardial perfusion without evidence of myocardial ischemia   Cardiac cath 07/12/18:   Prox LAD lesion is 60% stenosed.  Mid LAD lesion is 35% stenosed.  Ost Cx to Prox Cx lesion is 30%  stenosed.  Ost RCA to Prox RCA lesion is 85% stenosed.  Prox RCA lesion is 75% stenosed.  Mid RCA lesion is 60% stenosed.  Mid LAD to Dist LAD lesion is 10% stenosed.    Past Medical History:  Diagnosis Date  . Anxiety   . Arthritis   . Coronary atherosclerosis of native coronary artery 11/19/2004   DES to LAD x2, OM1, and [place] 11/2004  . DDD (degenerative disc disease), lumbar   . Depression   . GERD (gastroesophageal reflux disease)   . Other and unspecified hyperlipidemia   . Status post cardiac catheterization 2003   [facility]  . Type II or unspecified type diabetes mellitus without mention of complication, not stated as uncontrolled    TYPE 2   . Unspecified essential hypertension   . Wears glasses     Past Surgical History:  Procedure Laterality Date  . ANKLE FUSION  2003   left ankle  . BACK SURGERY  93,97   lumb x2  . CARDIAC CATHETERIZATION  2003  . COLONOSCOPY WITH PROPOFOL N/A 11/06/2015   Procedure: COLONOSCOPY WITH PROPOFOL;  Surgeon: Robert Bellow, MD;  Location: [facility] ENDOSCOPY;  Service: Endoscopy;  Laterality: N/A;  . CORONARY ANGIOPLASTY  2006  . DORSAL COMPARTMENT RELEASE Left 12/29/2013   Procedure: LEFT WRIST RELEASE DORSAL COMPARTMENT (DEQUERVAIN);  Surgeon: Rayvon Char  Grandville Silos, MD;  Location: [place];  Service: Orthopedics;  Laterality: Left;  . ESOPHAGOGASTRODUODENOSCOPY (EGD) WITH PROPOFOL N/A 11/06/2015   Procedure: ESOPHAGOGASTRODUODENOSCOPY (EGD) WITH PROPOFOL;  Surgeon: Robert Bellow, MD;  Location: [facility] ENDOSCOPY;  Service: Endoscopy;  Laterality: N/A;  . KNEE SURGERY  1978   rt  . LEFT HEART CATH AND CORONARY ANGIOGRAPHY Left 07/12/2018   Procedure: LEFT HEART CATH AND CORONARY ANGIOGRAPHY;  Surgeon: Corey Skains, MD;  Location: [place] CV LAB;  Service: Cardiovascular;  Laterality: Left;  . NECK SURGERY  2010    MEDICATIONS: . amLODipine (NORVASC) 5 MG tablet  . aspirin 325 MG tablet  . BD  PEN NEEDLE NANO 2ND GEN 32G X 4 MM MISC  . Blood Glucose Monitoring Suppl (CONTOUR NEXT MONITOR) w/Device KIT  . CONTOUR NEXT TEST test strip  . lisinopril (ZESTRIL) 20 MG tablet  . metFORMIN (GLUCOPHAGE) 1000 MG tablet  . MICROLET LANCETS MISC  . MULTIPLE VITAMINS PO  . omeprazole (PRILOSEC) 20 MG capsule  . rosuvastatin (CRESTOR) 40 MG tablet  . sertraline (ZOLOFT) 50 MG tablet  . tadalafil (CIALIS) 20 MG tablet  . TRESIBA FLEXTOUCH 100 UNIT/ML FlexTouch Pen   No current facility-administered medications for this encounter.   If no changes, I anticipate pt can proceed with surgery as scheduled.   Willeen Cass, PhD, FNP-BC [facility] Short Stay Surgical Center/Anesthesiology Phone: [phone] 05/01/2020 11:30 AM

## 2020-05-02 ENCOUNTER — Ambulatory Visit (HOSPITAL_COMMUNITY): Payer: Self-pay | Admitting: Certified Registered Nurse Anesthetist

## 2020-05-02 ENCOUNTER — Encounter (HOSPITAL_COMMUNITY)
Admission: RE | Disposition: A | Discharge status: Home / Self Care | Payer: Self-pay | Source: Other Acute Inpatient Hospital | Attending: Orthopedic Surgery

## 2020-05-02 ENCOUNTER — Encounter (HOSPITAL_COMMUNITY): Payer: Self-pay | Admitting: Orthopedic Surgery

## 2020-05-02 ENCOUNTER — Ambulatory Visit (HOSPITAL_COMMUNITY): Payer: Self-pay

## 2020-05-02 ENCOUNTER — Ambulatory Visit (HOSPITAL_COMMUNITY)
Admission: RE | Admit: 2020-05-02 | Discharge: 2020-05-02 | Disposition: A | Discharge status: Home / Self Care | Payer: Self-pay | Source: Other Acute Inpatient Hospital | Attending: Orthopedic Surgery | Admitting: Orthopedic Surgery

## 2020-05-02 DIAGNOSIS — Z955 Presence of coronary angioplasty implant and graft: Secondary | ICD-10-CM | POA: Insufficient documentation

## 2020-05-02 DIAGNOSIS — M25711 Osteophyte, right shoulder: Secondary | ICD-10-CM | POA: Insufficient documentation

## 2020-05-02 DIAGNOSIS — Z7984 Long term (current) use of oral hypoglycemic drugs: Secondary | ICD-10-CM | POA: Insufficient documentation

## 2020-05-02 DIAGNOSIS — Z87891 Personal history of nicotine dependence: Secondary | ICD-10-CM | POA: Insufficient documentation

## 2020-05-02 DIAGNOSIS — Z888 Allergy status to other drugs, medicaments and biological substances status: Secondary | ICD-10-CM | POA: Insufficient documentation

## 2020-05-02 DIAGNOSIS — Z7982 Long term (current) use of aspirin: Secondary | ICD-10-CM | POA: Insufficient documentation

## 2020-05-02 DIAGNOSIS — M19011 Primary osteoarthritis, right shoulder: Secondary | ICD-10-CM | POA: Insufficient documentation

## 2020-05-02 DIAGNOSIS — Z01811 Encounter for preprocedural respiratory examination: Secondary | ICD-10-CM

## 2020-05-02 DIAGNOSIS — Z794 Long term (current) use of insulin: Secondary | ICD-10-CM | POA: Insufficient documentation

## 2020-05-02 DIAGNOSIS — Z96611 Presence of right artificial shoulder joint: Secondary | ICD-10-CM

## 2020-05-02 DIAGNOSIS — Z79899 Other long term (current) drug therapy: Secondary | ICD-10-CM | POA: Insufficient documentation

## 2020-05-02 HISTORY — PX: TOTAL SHOULDER ARTHROPLASTY: SHX126

## 2020-05-02 LAB — ABO/RH: ABO/RH(D): B NEG

## 2020-05-02 LAB — GLUCOSE, CAPILLARY
Glucose-Capillary: 134 mg/dL — ABNORMAL HIGH (ref 70–99)
Glucose-Capillary: 131 mg/dL — ABNORMAL HIGH (ref 70–99)

## 2020-05-02 SURGERY — ARTHROPLASTY, SHOULDER, TOTAL
Anesthesia: Regional | Site: Shoulder | Laterality: Right

## 2020-05-02 MED ORDER — PHENYLEPHRINE HCL-NACL 10-0.9 MG/250ML-% IV SOLN
INTRAVENOUS | Status: DC | PRN
  Administered 2020-05-02: 50 ug/min via INTRAVENOUS

## 2020-05-02 MED ORDER — OXYCODONE-ACETAMINOPHEN 5-325 MG PO TABS: ORAL_TABLET | ORAL | 0 refills | Status: DC

## 2020-05-02 MED ORDER — ONDANSETRON HCL 4 MG/2ML IJ SOLN
INTRAMUSCULAR | Status: DC | PRN
  Administered 2020-05-02: 4 mg via INTRAVENOUS

## 2020-05-02 MED ORDER — WATER FOR IRRIGATION, STERILE IR SOLN
Status: DC | PRN
  Administered 2020-05-02: 2000 mL

## 2020-05-02 MED ORDER — FENTANYL CITRATE (PF) 100 MCG/2ML IJ SOLN: 25.0000 ug | INTRAMUSCULAR | Status: DC | PRN

## 2020-05-02 MED ORDER — TRANEXAMIC ACID-NACL 1000-0.7 MG/100ML-% IV SOLN: 1000.0000 mg | INTRAVENOUS | Status: DC

## 2020-05-02 MED ORDER — DEXAMETHASONE SODIUM PHOSPHATE 10 MG/ML IJ SOLN
INTRAMUSCULAR | Status: AC
  Filled 2020-05-02: qty 1

## 2020-05-02 MED ORDER — MIDAZOLAM HCL 5 MG/5ML IJ SOLN
INTRAMUSCULAR | Status: DC | PRN
  Administered 2020-05-02 (×2): 1 mg via INTRAVENOUS

## 2020-05-02 MED ORDER — CEFAZOLIN SODIUM-DEXTROSE 2-4 GM/100ML-% IV SOLN
2.0000 g | INTRAVENOUS | Status: AC
  Administered 2020-05-02: 2 g via INTRAVENOUS
  Filled 2020-05-02: qty 100

## 2020-05-02 MED ORDER — ORAL CARE MOUTH RINSE
15.0000 mL | Freq: Once | OROMUCOSAL | Status: AC
  Administered 2020-05-02: 15 mL via OROMUCOSAL

## 2020-05-02 MED ORDER — MIDAZOLAM HCL 2 MG/2ML IJ SOLN
INTRAMUSCULAR | Status: AC
  Filled 2020-05-02: qty 2

## 2020-05-02 MED ORDER — PHENYLEPHRINE HCL-NACL 10-0.9 MG/250ML-% IV SOLN
INTRAVENOUS | Status: AC
  Filled 2020-05-02: qty 250

## 2020-05-02 MED ORDER — FENTANYL CITRATE (PF) 100 MCG/2ML IJ SOLN
INTRAMUSCULAR | Status: AC
  Filled 2020-05-02: qty 2

## 2020-05-02 MED ORDER — LIDOCAINE 2% (20 MG/ML) 5 ML SYRINGE
INTRAMUSCULAR | Status: DC | PRN
  Administered 2020-05-02: 60 mg via INTRAVENOUS

## 2020-05-02 MED ORDER — BUPIVACAINE LIPOSOME 1.3 % IJ SUSP
INTRAMUSCULAR | Status: DC | PRN
  Administered 2020-05-02: 10 mL via PERINEURAL

## 2020-05-02 MED ORDER — BUPIVACAINE HCL (PF) 0.5 % IJ SOLN
INTRAMUSCULAR | Status: DC | PRN
  Administered 2020-05-02: 15 mL via PERINEURAL

## 2020-05-02 MED ORDER — ROCURONIUM BROMIDE 10 MG/ML (PF) SYRINGE
PREFILLED_SYRINGE | INTRAVENOUS | Status: AC
  Filled 2020-05-02: qty 10

## 2020-05-02 MED ORDER — TIZANIDINE HCL 4 MG PO TABS: 4.0000 mg | ORAL_TABLET | Freq: Three times a day (TID) | ORAL | 1 refills | Status: DC | PRN

## 2020-05-02 MED ORDER — OXYCODONE HCL 5 MG/5ML PO SOLN: 5.0000 mg | Freq: Once | ORAL | Status: DC | PRN

## 2020-05-02 MED ORDER — ONDANSETRON HCL 4 MG/2ML IJ SOLN
INTRAMUSCULAR | Status: AC
  Filled 2020-05-02: qty 2

## 2020-05-02 MED ORDER — ROCURONIUM BROMIDE 10 MG/ML (PF) SYRINGE
PREFILLED_SYRINGE | INTRAVENOUS | Status: DC | PRN
  Administered 2020-05-02: 70 mg via INTRAVENOUS

## 2020-05-02 MED ORDER — CEFAZOLIN SODIUM-DEXTROSE 2-4 GM/100ML-% IV SOLN: 2.0000 g | INTRAVENOUS | Status: DC

## 2020-05-02 MED ORDER — LACTATED RINGERS IV SOLN: INTRAVENOUS | Status: DC

## 2020-05-02 MED ORDER — CHLORHEXIDINE GLUCONATE 0.12 % MT SOLN: 15.0000 mL | Freq: Once | OROMUCOSAL | Status: AC

## 2020-05-02 MED ORDER — PROPOFOL 10 MG/ML IV BOLUS
INTRAVENOUS | Status: DC | PRN
  Administered 2020-05-02: 180 mg via INTRAVENOUS

## 2020-05-02 MED ORDER — PROPOFOL 10 MG/ML IV BOLUS
INTRAVENOUS | Status: AC
  Filled 2020-05-02: qty 20

## 2020-05-02 MED ORDER — SODIUM CHLORIDE 0.9 % IR SOLN
Status: DC | PRN
  Administered 2020-05-02 (×2): 1000 mL

## 2020-05-02 MED ORDER — SUGAMMADEX SODIUM 200 MG/2ML IV SOLN
INTRAVENOUS | Status: DC | PRN
  Administered 2020-05-02: 200 mg via INTRAVENOUS

## 2020-05-02 MED ORDER — PROMETHAZINE HCL 25 MG/ML IJ SOLN: 6.2500 mg | INTRAMUSCULAR | Status: DC | PRN

## 2020-05-02 MED ORDER — OXYCODONE HCL 5 MG PO TABS: 5.0000 mg | ORAL_TABLET | Freq: Once | ORAL | Status: DC | PRN

## 2020-05-02 MED ORDER — VANCOMYCIN HCL 1000 MG IV SOLR
INTRAVENOUS | Status: AC
  Filled 2020-05-02: qty 1000

## 2020-05-02 MED ORDER — ACETAMINOPHEN 10 MG/ML IV SOLN: 1000.0000 mg | Freq: Once | INTRAVENOUS | Status: DC | PRN

## 2020-05-02 MED ORDER — LIDOCAINE 2% (20 MG/ML) 5 ML SYRINGE
INTRAMUSCULAR | Status: AC
  Filled 2020-05-02: qty 5

## 2020-05-02 MED ORDER — TRANEXAMIC ACID-NACL 1000-0.7 MG/100ML-% IV SOLN
1000.0000 mg | INTRAVENOUS | Status: AC
  Administered 2020-05-02: 1000 mg via INTRAVENOUS
  Filled 2020-05-02: qty 100

## 2020-05-02 MED ORDER — FENTANYL CITRATE (PF) 100 MCG/2ML IJ SOLN
INTRAMUSCULAR | Status: DC | PRN
  Administered 2020-05-02 (×2): 50 ug via INTRAVENOUS

## 2020-05-02 MED ORDER — DEXAMETHASONE SODIUM PHOSPHATE 4 MG/ML IJ SOLN
INTRAMUSCULAR | Status: DC | PRN
  Administered 2020-05-02: 5 mg via INTRAVENOUS

## 2020-05-02 SURGICAL SUPPLY — 62 items
BAG ZIPLOCK 12X15 (MISCELLANEOUS) ×2 IMPLANT
BIT DRILL 1.6MX128 (BIT) ×2 IMPLANT
BLADE SAW SAG 73X25 THK (BLADE) ×1
BLADE SAW SGTL 73X25 THK (BLADE) ×1 IMPLANT
CEMENT BONE DEPUY (Cement) ×2 IMPLANT
COOLER ICEMAN CLASSIC (MISCELLANEOUS) ×2 IMPLANT
COVER BACK TABLE 60X90IN (DRAPES) ×2 IMPLANT
COVER SURGICAL LIGHT HANDLE (MISCELLANEOUS) ×2 IMPLANT
COVER WAND RF STERILE (DRAPES) IMPLANT
DRAPE INCISE IOBAN 66X45 STRL (DRAPES) ×2 IMPLANT
DRAPE ORTHO SPLIT 77X108 STRL (DRAPES) ×4
DRAPE POUCH INSTRU U-SHP 10X18 (DRAPES) ×2 IMPLANT
DRAPE SURG 17X11 SM STRL (DRAPES) ×2 IMPLANT
DRAPE SURG ORHT 6 SPLT 77X108 (DRAPES) ×2 IMPLANT
DRAPE TOP 10253 STERILE (DRAPES) ×2 IMPLANT
DRAPE U-SHAPE 47X51 STRL (DRAPES) ×2 IMPLANT
DRSG AQUACEL AG ADV 3.5X 6 (GAUZE/BANDAGES/DRESSINGS) ×2 IMPLANT
DURAPREP 26ML APPLICATOR (WOUND CARE) ×4 IMPLANT
ELECT BLADE TIP CTD 4 INCH (ELECTRODE) ×2 IMPLANT
ELECT REM PT RETURN 15FT ADLT (MISCELLANEOUS) ×2 IMPLANT
GLENOID PEGGED CORTILOC M40 (Orthopedic Implant) ×1 IMPLANT
GLOVE SRG 8 PF TXTR STRL LF DI (GLOVE) ×1 IMPLANT
GLOVE SURG ENC MOIS LTX SZ7 (GLOVE) ×2 IMPLANT
GLOVE SURG ENC MOIS LTX SZ7.5 (GLOVE) ×2 IMPLANT
GLOVE SURG UNDER POLY LF SZ7 (GLOVE) ×2 IMPLANT
GLOVE SURG UNDER POLY LF SZ8 (GLOVE) ×2
GOWN STRL REUS W/TWL LRG LVL3 (GOWN DISPOSABLE) ×2 IMPLANT
GOWN STRL REUS W/TWL XL LVL3 (GOWN DISPOSABLE) ×2 IMPLANT
GUIDEWIRE GLENOID 2.5X220 (WIRE) ×2 IMPLANT
HANDPIECE INTERPULSE COAX TIP (DISPOSABLE) ×2
HEAD HUMERAL AEQUALIS 52 SHLDR (Miscellaneous) ×2 IMPLANT
HEMOSTAT SURGICEL 2X14 (HEMOSTASIS) ×2 IMPLANT
HOOD PEEL AWAY FLYTE STAYCOOL (MISCELLANEOUS) ×6 IMPLANT
KIT BASIN OR (CUSTOM PROCEDURE TRAY) ×2 IMPLANT
KIT TURNOVER KIT A (KITS) ×2 IMPLANT
MANIFOLD NEPTUNE II (INSTRUMENTS) ×2 IMPLANT
NEEDLE TROCAR POINT SZ 2 1/2 (NEEDLE) ×2 IMPLANT
NS IRRIG 1000ML POUR BTL (IV SOLUTION) ×2 IMPLANT
PACK SHOULDER (CUSTOM PROCEDURE TRAY) ×2 IMPLANT
PAD COLD SHLDR WRAP-ON (PAD) ×2 IMPLANT
PEGGED GLENOID CORTILOC (Orthopedic Implant) ×1 IMPLANT
PROTECTOR NERVE ULNAR (MISCELLANEOUS) IMPLANT
RESTRAINT HEAD UNIVERSAL NS (MISCELLANEOUS) ×2 IMPLANT
RETRIEVER SUT HEWSON (MISCELLANEOUS) ×2 IMPLANT
SET HNDPC FAN SPRY TIP SCT (DISPOSABLE) ×1 IMPLANT
SLING ARM IMMOBILIZER LRG (SOFTGOODS) ×2 IMPLANT
SMARTMIX MINI TOWER (MISCELLANEOUS) ×2
SPONGE LAP 18X18 RF (DISPOSABLE) ×2 IMPLANT
STEM HUMERAL AEQUALIS 70XS2C (Stem) ×2 IMPLANT
STRIP CLOSURE SKIN 1/2X4 (GAUZE/BANDAGES/DRESSINGS) ×2 IMPLANT
SUCTION FRAZIER HANDLE 12FR (TUBING) ×2
SUCTION TUBE FRAZIER 12FR DISP (TUBING) ×1 IMPLANT
SUPPORT WRAP ARM LG (MISCELLANEOUS) ×2 IMPLANT
SUT ETHIBOND 2 V 37 (SUTURE) ×4 IMPLANT
SUT MNCRL AB 4-0 PS2 18 (SUTURE) ×2 IMPLANT
SUT VIC AB 2-0 CT1 27 (SUTURE) ×4
SUT VIC AB 2-0 CT1 TAPERPNT 27 (SUTURE) ×2 IMPLANT
TAPE LABRALWHITE 1.5X36 (TAPE) ×2 IMPLANT
TAPE SUT LABRALTAP WHT/BLK (SUTURE) ×2 IMPLANT
TOWEL OR 17X26 10 PK STRL BLUE (TOWEL DISPOSABLE) ×2 IMPLANT
TOWER SMARTMIX MINI (MISCELLANEOUS) ×1 IMPLANT
WATER STERILE IRR 1000ML POUR (IV SOLUTION) ×4 IMPLANT

## 2020-05-02 NOTE — Op Note (Signed)
Procedure(s): TOTAL SHOULDER ARTHROPLASTY Procedure Note  Samuel Gentry male 65 y.o. 05/02/2020  Preoperative diagnosis: Right shoulder end-stage osteoarthritis  Postoperative diagnosis: Same  Procedure(s) and Anesthesia Type:    Right TOTAL SHOULDER ARTHROPLASTY - Choice  Surgeon(s) and Role:    Jones Broom, MD - Primary   Indications:  65 y.o. male  With endstage right shoulder arthritis. Pain and dysfunction interfered with quality of life and nonoperative treatment with activity modification, NSAIDS and injections failed.     Surgeon: Berline Lopes   Assistants: Damita Lack PA-C [facility] was present and scrubbed throughout the procedure and was essential in positioning, retraction, exposure, and closure)  Anesthesia: General endotracheal anesthesia with preoperative interscalene block given by the attending anesthesiologist    Procedure Detail  TOTAL SHOULDER ARTHROPLASTY  Findings: Tornier flex anatomic press-fit size 2 stem with a 52 head, cemented size 37M Cortiloc glenoid.   A lesser tuberosity osteotomy was performed and repaired at the conclusion of the procedure.  Estimated Blood Loss:  200 mL         Drains: None   Blood Given: none          Specimens: none        Complications:  * No complications entered in OR log *         Disposition: PACU - hemodynamically stable.         Condition: stable    Procedure:   The patient was identified in the preoperative holding area where I personally marked the operative extremity after verifying with the patient and consent. He  was taken to the operating room where He was transferred to the   operative table.  The patient received an interscalene block in   the holding area by the attending anesthesiologist.  General anesthesia was induced   in the operating room without complication.  The patient did receive IV  Ancef prior to the commencement of the procedure.  The patient was   placed  in the beach-chair position with the back raised about 30   degrees.  The nonoperative extremity and head and neck were carefully   positioned and padded protecting against neurovascular compromise.  The   left upper extremity was then prepped and draped in the standard sterile   fashion.    The appropriate operative time-out was performed with   Anesthesia, the perioperative staff, as well as myself and we all agreed   that the right side was the correct operative site.  An approximately   10 cm incision was made from the tip of the coracoid to the center point of the   humerus at the level of the axilla.  Dissection was carried down sharply   through subcutaneous tissues and cephalic vein was identified and taken   laterally with the deltoid.  The pectoralis major was taken medially.  The   upper 1 cm of the pectoralis major was released from its attachment on   the humerus.  The clavipectoral fascia was incised just lateral to the   conjoined tendon.  This incision was carried up to but not into the   coracoacromial ligament.  Digital palpation was used to prove   integrity of the axillary nerve which was protected throughout the   procedure.  Musculocutaneous nerve was not palpated in the operative   field.  Conjoined tendon was then retracted gently medially and the   deltoid laterally.  Anterior circumflex humeral vessels were clamped and  coagulated.  The soft tissues overlying the biceps was incised and this   incision was carried across the transverse humeral ligament to the base   of the coracoid.  The biceps was noted to be severely degenerated. It was released from the superior labrum. The biceps was then tenodesed to the soft tissue just above   pectoralis major and the remaining portion of the biceps superiorly was   excised.  An osteotomy was performed at the lesser tuberosity.  The capsule was then   released all the way down to the 6 o'clock position of the humeral head.    The humeral head was then delivered with simultaneous adduction,   extension and external rotation.  All humeral osteophytes were removed   and the anatomic neck of the humerus was marked and cut free hand at   approximately 25 degrees retroversion within about 3 mm of the cuff   reflection posteriorly.  The head size was estimated to be a 70M medium   offset.  At that point, the humeral head was retracted posteriorly with   a Fukuda retractor.   Remaining portion of the capsule was released at the base of the   coracoid.  The remaining biceps anchor and the entire anterior-inferior   labrum was excised.  The posterior labrum was also excised but the   posterior capsule was not released.  The guidepin was placed bicortically with none elevated guide.  The reamer was used to ream to concentric bone with punctate bleeding.  This gave an excellent concentric surface.  The center hole was then drilled for an anchor peg glenoid followed by the three peripheral holes and none of the holes   exited the glenoid wall.  I then pulse irrigated these holes and dried   them with Surgicel.  The three peripheral holes were then   pressurized cemented and the anchor peg glenoid was placed and impacted   with an excellent fit.  The glenoid was a 70M component.  The proximal humerus was then again exposed taking care not to displace the glenoid.    The entry awl was used followed by sounding reamers and then sequentially broached from size1 to 2. This was then left in place and the calcar planer was used. Trial head was placed with a 52.  With the trial implantation of the component,  there was approximately 50% posterior translation with immediate snap back to the   anatomic position.  With forward elevation, there was no tendency   towards posterior subluxation.   The trial was removed and the final implant was prepared on a back table.  The trial was removed and the final implant was prepared on a back table.   3  small holes were drilled on the medial side of the lesser tuberosity osteotomy, through which 2 labral tapes were passed. The implant was then placed through the loop of the 2 labral tapes and impacted with an excellent press-fit. This achieved excellent anatomic reconstruction of the proximal humerus.  The joint was then copiously irrigated with pulse lavage.  The subscapularis and   lesser tuberosity osteotomy were then repaired using the 2 labral tapes previously passed in a double row fashion with horizontal mattress sutures medially brought over through bone tunnels tied over a bone bridge laterally.   One #1 Ethibond was placed at the rotator interval just above   the lesser tuberosity. Copious irrigation was used. Skin was closed with 2-0 Vicryl sutures in the deep  dermal layer and 4-0 Monocryl in a subcuticular  running fashion.  Sterile dressings were then applied including Aquacel.  The patient was placed in a sling and allowed to awaken from general anesthesia and taken to the recovery room in stable condition.      POSTOPERATIVE PLAN:  Early passive range of motion will be allowed with the goal of 0 degrees external rotation and 90 degrees forward elevation.  No internal rotation at this time.  No active motion of the arm until the lesser tuberosity heals.  The patient will be observed in the recovery room and if his pain is well controlled with the regional block and he is hemodynamically stable I think he could go home today with family.

## 2020-05-02 NOTE — Transfer of Care (Signed)
Immediate Anesthesia Transfer of Care Note  Patient: Samuel Gentry  Procedure(s) Performed: TOTAL SHOULDER ARTHROPLASTY (Right Shoulder)  Patient Location: PACU  Anesthesia Type:GA combined with regional for post-op pain  Level of Consciousness: awake and patient cooperative  Airway & Oxygen Therapy: Patient Spontanous Breathing and Patient connected to face mask  Post-op Assessment: Report given to RN and Post -op Vital signs reviewed and stable  Post vital signs: Reviewed and stable  Last Vitals:  Vitals Value Taken Time  BP    Temp    Pulse 72 05/02/20 0920  Resp    SpO2 100 % 05/02/20 0920  Vitals shown include unvalidated device data.  Last Pain:  Vitals:   05/02/20 0609  TempSrc: Oral         Complications: No complications documented.

## 2020-05-02 NOTE — Evaluation (Signed)
Occupational Therapy Evaluation Patient Details Name: DEMONE LYLES MRN: [id number] DOB: 12-30-1955 Today's Date: 05/02/2020    History of Present Illness Patient s/p R TSA   Clinical Impression   Mr. Kary Colaizzi is a 65 year old man s/p shoulder replacement without functional use of right non- dominant upper extremity secondary to effects of surgery, interscalene block and shoulder precautions. Therapist provided education and instruction to patient and spouse in regards to exercises, precautions, positioning, donning upper extremity clothing and bathing while maintaining shoulder precautions, ice and edema management with ice cuff and cooler and donning/doffing sling. Patient and spouse verbalized understanding and demonstrated as needed. Patient needed assistance to donn sling and shirt but otherwise able to don LB clothing with one hand. Recommended loose clothing and elastic waist pants. Patient provided with handouts to maximize retention of education. No further OT needs at this time. Patient to follow up with MD for further therapy needs.      Follow Up Recommendations  Follow surgeon's recommendation for DC plan and follow-up therapies    Equipment Recommendations  None recommended by OT    Recommendations for Other Services       Precautions / Restrictions        Mobility Bed Mobility                    Transfers                      Balance Overall balance assessment: No apparent balance deficits (not formally assessed)                                         ADL either performed or assessed with clinical judgement   ADL Overall ADL's : Needs assistance/impaired                 Upper Body Dressing : Moderate assistance;Sitting;Adhering to UE precautions   Lower Body Dressing: Modified independent                       Vision Baseline Vision/History: Wears glasses Wears Glasses: At all times        Perception     Praxis      Pertinent Vitals/Pain Pain Assessment: No/denies pain ((interscalene block))     Hand Dominance Left   Extremity/Trunk Assessment Upper Extremity Assessment Upper Extremity Assessment: RUE deficits/detail RUE Deficits / Details: able to grossly open and close fingers. otherwise active ROM limited by interscalene block RUE Sensation: decreased light touch   Lower Extremity Assessment Lower Extremity Assessment: Overall WFL for tasks assessed   Cervical / Trunk Assessment Cervical / Trunk Assessment: Normal   Communication Communication Communication: No difficulties   Cognition Arousal/Alertness: Awake/alert Behavior During Therapy: WFL for tasks assessed/performed Overall Cognitive Status: Within Functional Limits for tasks assessed                                     General Comments       Exercises     Shoulder Instructions Shoulder Instructions Donning/doffing shirt without moving shoulder: Caregiver independent with task;Patient able to independently direct caregiver Method for sponge bathing under operated UE: Caregiver independent with task;Patient able to independently direct caregiver Donning/doffing sling/immobilizer: Caregiver independent with task;Patient able to independently direct  caregiver Correct positioning of sling/immobilizer: Caregiver independent with task;Patient able to independently direct caregiver ROM for elbow, wrist and digits of operated UE: Patient able to independently direct caregiver Sling wearing schedule (on at all times/off for ADL's): Patient able to independently direct caregiver Proper positioning of operated UE when showering: Patient able to independently direct caregiver Dressing change: Patient able to independently direct caregiver Positioning of UE while sleeping: Patient able to independently direct caregiver    Home Living Family/patient expects to be discharged to:: Private  residence Living Arrangements: Spouse/significant other Available Help at Discharge: Family Type of Home: House             Bathroom Shower/Tub: Walk-in Soil scientist Toilet: Handicapped height     Home Equipment: None          Prior Functioning/Environment Level of Independence: Independent                 OT Problem List: Decreased strength;Decreased range of motion      OT Treatment/Interventions:      OT Goals(Current goals can be found in the care plan section) Acute Rehab OT Goals OT Goal Formulation: All assessment and education complete, DC therapy  OT Frequency:     Barriers to D/C:            Co-evaluation              AM-PAC OT "6 Clicks" Daily Activity     Outcome Measure Help from another person eating meals?: None Help from another person taking care of personal grooming?: None Help from another person toileting, which includes using toliet, bedpan, or urinal?: None Help from another person bathing (including washing, rinsing, drying)?: A Little Help from another person to put on and taking off regular upper body clothing?: A Lot Help from another person to put on and taking off regular lower body clothing?: None 6 Click Score: 21   End of Session Nurse Communication:  (OT education complete)  Activity Tolerance: Patient tolerated treatment well Patient left: in chair;with call bell/phone within reach;with family/visitor present  OT Visit Diagnosis: Muscle weakness (generalized) (M62.81)                Time: 0938-1829 OT Time Calculation (min): 27 min Charges:  OT General Charges $OT Visit: 1 Visit OT Evaluation $OT Eval Low Complexity: 1 Low OT Treatments $Self Care/Home Management : 8-22 mins  Deloyce Walthers, OTR/L Acute Care Rehab Services  Office [phone] Pager: [phone]   Kelli Churn 05/02/2020, 10:55 AM

## 2020-05-02 NOTE — H&P (Signed)
Samuel Gentry is an 65 y.o. male.   Chief Complaint: R shoulder pain and dysfunction HPI: Endstage R shoulder arthritis with significant pain and dysfunction, failed conservative measures.  Pain interferes with sleep and quality of life.   Past Medical History:  Diagnosis Date  . Anxiety   . Arthritis   . Coronary atherosclerosis of native coronary artery 11/19/2004   DES to LAD x2, OM1, and [place] 11/2004  . DDD (degenerative disc disease), lumbar   . Depression   . GERD (gastroesophageal reflux disease)   . Other and unspecified hyperlipidemia   . Status post cardiac catheterization 2003   [facility]  . Type II or unspecified type diabetes mellitus without mention of complication, not stated as uncontrolled    TYPE 2   . Unspecified essential hypertension   . Wears glasses     Past Surgical History:  Procedure Laterality Date  . ANKLE FUSION  2003   left ankle  . BACK SURGERY  93,97   lumb x2  . CARDIAC CATHETERIZATION  2003  . COLONOSCOPY WITH PROPOFOL N/A 11/06/2015   Procedure: COLONOSCOPY WITH PROPOFOL;  Surgeon: Robert Bellow, MD;  Location: [facility] ENDOSCOPY;  Service: Endoscopy;  Laterality: N/A;  . CORONARY ANGIOPLASTY  2006  . DORSAL COMPARTMENT RELEASE Left 12/29/2013   Procedure: LEFT WRIST RELEASE DORSAL COMPARTMENT (DEQUERVAIN);  Surgeon: Jolyn Nap, MD;  Location: [place];  Service: Orthopedics;  Laterality: Left;  . ESOPHAGOGASTRODUODENOSCOPY (EGD) WITH PROPOFOL N/A 11/06/2015   Procedure: ESOPHAGOGASTRODUODENOSCOPY (EGD) WITH PROPOFOL;  Surgeon: Robert Bellow, MD;  Location: [facility] ENDOSCOPY;  Service: Endoscopy;  Laterality: N/A;  . KNEE SURGERY  1978   rt  . LEFT HEART CATH AND CORONARY ANGIOGRAPHY Left 07/12/2018   Procedure: LEFT HEART CATH AND CORONARY ANGIOGRAPHY;  Surgeon: Corey Skains, MD;  Location: [facility];  Service: Cardiovascular;  Laterality: Left;  . NECK SURGERY  2010    Family History   Problem Relation Age of Onset  . Hyperlipidemia Mother   . Diabetes Father   . Prostate cancer Father    Social History:  reports that he quit smoking about 38 years ago. His smoking use included cigarettes. He has a 16.00 pack-year smoking history. He has never used smokeless tobacco. He reports that he does not drink alcohol and does not use drugs.  Allergies:  Allergies  Allergen Reactions  . Prednisone Other (See Comments)    Makes him very mean    Medications Prior to Admission  Medication Sig Dispense Refill  . amLODipine (NORVASC) 5 MG tablet TAKE 1 TABLET BY MOUTH EVERY DAY (Patient taking differently: Take 5 mg by mouth daily.) 180 tablet 1  . aspirin 325 MG tablet Take 325 mg by mouth daily.     . BD PEN NEEDLE NANO 2ND GEN 32G X 4 MM MISC USE AS DIRECTED WITH TRESIBA EVERY DAY 100 each 11  . Blood Glucose Monitoring Suppl (CONTOUR NEXT MONITOR) w/Device KIT USE AS DIRECTED 1 kit 0  . CONTOUR NEXT TEST test strip CHECK SUGAR ONCE DAILY AS DIRECTED 100 each 5  . lisinopril (ZESTRIL) 20 MG tablet TAKE 1 TABLET(20 MG) BY MOUTH DAILY (Patient taking differently: Take by mouth daily.) 90 tablet 4  . metFORMIN (GLUCOPHAGE) 1000 MG tablet TAKE 1 TABLET BY MOUTH TWICE DAILY WITH MEALS (Patient taking differently: Take 1,000 mg by mouth 2 (two) times daily with a meal.) 180 tablet 1  . MICROLET LANCETS MISC USE  AS DIRECTED ONCE DAILY 100 each 3  . MULTIPLE VITAMINS PO Take 1 tablet by mouth daily.    Marland Kitchen omeprazole (PRILOSEC) 20 MG capsule Take 1 capsule (20 mg total) by mouth 2 (two) times daily before a meal. (Patient taking differently: Take 20 mg by mouth daily.) 60 capsule 12  . rosuvastatin (CRESTOR) 40 MG tablet TAKE 1 TABLET(40 MG) BY MOUTH DAILY (Patient taking differently: Take 40 mg by mouth daily. TAKE 1 TABLET(40 MG) BY MOUTH DAILY) 30 tablet 11  . sertraline (ZOLOFT) 50 MG tablet TAKE 1 TABLET(50 MG) BY MOUTH DAILY (Patient taking differently: Take 50 mg by mouth daily.) 90  tablet 3  . tadalafil (CIALIS) 20 MG tablet Take 1 tablet (20 mg total) by mouth every three (3) days as needed for erectile dysfunction. 30 tablet 5  . TRESIBA FLEXTOUCH 100 UNIT/ML FlexTouch Pen ADMINISTER UP TO 30 UNITS UNDER THE SKIN DAILY AS DIRECTED (Patient taking differently: Inject 42 Units into the skin daily.) 30 mL 1    Results for orders placed or performed during the hospital encounter of 05/02/20 (from the past 48 hour(s))  ABO/Rh     Status: None   Collection Time: 05/02/20  5:58 AM  Result Value Ref Range   ABO/RH(D)      B NEG Performed at [facility] [address], [place], [place] [address]   Glucose, capillary     Status: Abnormal   Collection Time: 05/02/20  6:15 AM  Result Value Ref Range   Glucose-Capillary 134 (H) 70 - 99 mg/dL    Comment: Glucose reference range applies only to samples taken after fasting for at least 8 hours.   No results found.  Review of Systems  All other systems reviewed and are negative.   Blood pressure (!) 157/78, pulse 60, temperature 97.7 F (36.5 C), temperature source Oral, resp. rate 18, SpO2 99 %. Physical Exam Constitutional:      Appearance: He is well-developed.  HENT:     Head: Atraumatic.  Pulmonary:     Effort: Pulmonary effort is normal.  Musculoskeletal:     Comments: R shoulder pain with limited ROM.   Skin:    General: Skin is warm and dry.  Neurological:     Mental Status: He is alert and oriented to person, place, and time.      Assessment/Plan R endstage osteoarthritis  Plan R TSA Risks / benefits of surgery discussed Consent on chart  NPO for OR Preop antibiotics   Isabella Stalling, MD 05/02/2020, 7:16 AM

## 2020-05-02 NOTE — Anesthesia Procedure Notes (Signed)
Procedure Name: Intubation Date/Time: 05/02/2020 7:30 AM Performed by: Vanessa Covington, CRNA Pre-anesthesia Checklist: Patient identified, Emergency Drugs available, Suction available and Patient being monitored Patient Re-evaluated:Patient Re-evaluated prior to induction Oxygen Delivery Method: Circle system utilized Preoxygenation: Pre-oxygenation with 100% oxygen Induction Type: IV induction Ventilation: Mask ventilation without difficulty Laryngoscope Size: 2 and Miller Grade View: Grade I Tube type: Oral Tube size: 7.5 mm Number of attempts: 1 Airway Equipment and Method: Stylet Placement Confirmation: ETT inserted through vocal cords under direct vision,  positive ETCO2 and breath sounds checked- equal and bilateral Secured at: 21 cm Tube secured with: Tape Dental Injury: Teeth and Oropharynx as per pre-operative assessment

## 2020-05-02 NOTE — Discharge Instructions (Signed)
Discharge Instructions after Total Shoulder Arthroplasty   . A sling has been provided for you. Remove the sling 5 times each day to perform motion exercises.  . Use ice on the shoulder intermittently over the first 48 hours after surgery.  . Pain medication has been prescribed for you.  . Use your medication liberally over the first 48 hours, and then begin to taper your use. You may take Extra Strength Tylenol or Tylenol only in place of the pain pills. DO NOT take ANY nonsteroidal anti-inflammatory pain medications: Advil, Motrin, Ibuprofen, Aleve, Naproxen, or Naprosyn. . Take one aspirin a day for 2 weeks after surgery, unless you have an aspirin sensitivity/allergy or asthma. . Leave your dressing on until your first follow up visit.  You may shower with the dressing.  Hold your arm as if you still have your sling on while you shower. . Active reaching and lifting are not permitted. You may use the operative arm for activities of daily living that do not require the operative arm to leave the side of the body, such as eating, drinking, bathing, etc.  . Three to 5 times each day you should perform assisted overhead reaching and external rotation (outward turning) exercises with the operative arm. You were taught these exercises prior to discharge. Both exercises should be done with the non-operative arm used as the "therapist arm" while the operative arm remains relaxed. Ten of each exercise should be done three to five times each day.   Overhead reach is helping to lift your stiff arm up as high as it will go. To stretch your overhead reach, lie flat on your back, relax, and grasp the wrist of the tight shoulder with your opposite hand. Using the power in your opposite arm, bring the stiff arm up as far as it is comfortable. Start holding it for ten seconds and then work up to where you can hold it for a count of 30. Breathe slowly and deeply while the arm is moved. Repeat this stretch ten times,  trying to help the ar up a little higher each time.     External rotation is turning the arm out to the side while your elbow stays close to your body. External rotation is best stretched while you are lying on your back. Hold a cane, yardstick, broom handle, or dowel in both hands. Bend both elbows to a right angle. Use steady, gentle force from your normal arm to rotate the hand of the stiff shoulder out away from your body. Continue the rotation until it is straight in front of you holding it there for a count of 10. Do not go beyond this level of rotation until seen back by Dr. Chandler. Repeat this exercise ten times slowly.      Please call [phone] during normal business hours or [phone] after hours for any problems. Including the following:  - excessive redness of the incisions - drainage for more than 4 days - fever of more than 101.5 F  *Please note that pain medications will not be refilled after hours or on weekends.  Dental Antibiotics:  In most cases prophylactic antibiotics for Dental procdeures after total joint surgery are not necessary.  Exceptions are as follows:  1. History of prior total joint infection  2. Severely immunocompromised (Organ Transplant, cancer chemotherapy, Rheumatoid biologic meds such as Humera)  3. Poorly controlled diabetes (A1C &gt; 8.0, blood glucose over 200)  If you have one of these conditions, contact your surgeon   for an antibiotic prescription, prior to your dental procedure.   DIET:  As you were doing prior to hospitalization, we recommend a well-balanced diet  CONSTIPATION  Constipation is defined medically as fewer than three stools per week and severe constipation as less than one stool per week.  Even if you have a regular bowel pattern at home, your normal regimen is likely to be disrupted due to multiple reasons following surgery.  Combination of anesthesia, postoperative narcotics, change in appetite and fluid  intake all can affect your bowels.   YOU MUST use at least one of the following options; they are listed in order of increasing strength to get the job done.  They are all available over the counter, and you may need to use some, POSSIBLY even all of these options:    Drink plenty of fluids (prune juice may be helpful) and high fiber foods Colace 100 mg by mouth twice a day  Senokot for constipation as directed and as needed Dulcolax (bisacodyl), take with full glass of water  Miralax (polyethylene glycol) once or twice a day as needed.  If you have tried all these things and are unable to have a bowel movement in the first 3-4 days after surgery call either your surgeon or your primary doctor.    If you experience loose stools or diarrhea, hold the medications until you stool forms back up.  If your symptoms do not get better within 1 week or if they get worse, check with your doctor.  If you experience "the worst abdominal pain ever" or develop nausea or vomiting, please contact the office immediately for further recommendations for treatment.   ITCHING:  If you experience itching with your medications, try taking only a single pain pill, or even half a pain pill at a time.  You can also use Benadryl over the counter for itching or also to help with sleep.   MEDICATIONS:  See your medication summary on the "After Visit Summary" that nursing will review with you.  You may have some home medications which will be placed on hold until you complete the course of blood thinner medication.  It is important for you to complete the blood thinner medication as prescribed.  PRECAUTIONS:  If you experience chest pain or shortness of breath - call 911 immediately for transfer to the hospital emergency department.   If you develop a fever greater that 101 F, purulent drainage from wound, increased redness or drainage from wound, foul odor from the wound/dressing, or calf pain - CONTACT YOUR SURGEON.      MAKE SURE YOU:  . Understand these instructions.  . Get help right away if you are not doing well or get worse.    Thank you for letting us be a part of your medical care team.  It is a privilege we respect greatly.  We hope these instructions will help you stay on track for a fast and full recovery!         

## 2020-05-02 NOTE — Anesthesia Procedure Notes (Signed)
Anesthesia Regional Block: Interscalene brachial plexus block   Pre-Anesthetic Checklist: ,, timeout performed, Correct Patient, Correct Site, Correct Laterality, Correct Procedure, Correct Position, site marked, Risks and benefits discussed,  Surgical consent,  Pre-op evaluation,  At surgeon's request and post-op pain management  Laterality: Right  Prep: Dura Prep       Needles:  Injection technique: Single-shot  Needle Type: Echogenic Stimulator Needle     Needle Length: 5cm  Needle Gauge: 20     Additional Needles:   Procedures:,,,, ultrasound used (permanent image in chart),,,,  Narrative:  Start time: 05/02/2020 6:52 AM End time: 05/02/2020 6:58 AM Injection made incrementally with aspirations every 5 mL.  Performed by: Personally  Anesthesiologist: Atilano Median, DO  Additional Notes: Patient identified. Risks/Benefits/Options discussed with patient including but not limited to bleeding, infection, nerve damage, failed block, incomplete pain control. Patient expressed understanding and wished to proceed. All questions were answered. Sterile technique was used throughout the entire procedure. Please see nursing notes for vital signs. Aspirated in 5cc intervals with injection for negative confirmation. Patient was given instructions on fall risk and not to get out of bed. All questions and concerns addressed with instructions to call with any issues or inadequate analgesia.

## 2020-05-02 NOTE — Anesthesia Postprocedure Evaluation (Signed)
Anesthesia Post Note  Patient: Samuel Gentry  Procedure(s) Performed: TOTAL SHOULDER ARTHROPLASTY (Right Shoulder)     Patient location during evaluation: PACU Anesthesia Type: Regional and General Level of consciousness: awake and alert Pain management: pain level controlled Vital Signs Assessment: post-procedure vital signs reviewed and stable Respiratory status: spontaneous breathing, nonlabored ventilation, respiratory function stable and patient connected to nasal cannula oxygen Cardiovascular status: blood pressure returned to baseline and stable Postop Assessment: no apparent nausea or vomiting Anesthetic complications: no   No complications documented.  Last Vitals:  Vitals:   05/02/20 1000 05/02/20 1045  BP: 138/79 132/78  Pulse: 64 60  Resp: 16 16  Temp: 36.6 C   SpO2: 96% 96%    Last Pain:  Vitals:   05/02/20 1045  TempSrc:   PainSc: 0-No pain                 Earl Lites P Sukhraj Esquivias

## 2020-05-03 ENCOUNTER — Encounter (HOSPITAL_COMMUNITY): Payer: Self-pay | Admitting: Orthopedic Surgery

## 2020-05-15 ENCOUNTER — Other Ambulatory Visit: Payer: Self-pay | Admitting: Family Medicine

## 2020-05-15 ENCOUNTER — Telehealth: Payer: Self-pay

## 2020-05-15 DIAGNOSIS — L659 Nonscarring hair loss, unspecified: Secondary | ICD-10-CM

## 2020-05-15 DIAGNOSIS — I10 Essential (primary) hypertension: Secondary | ICD-10-CM

## 2020-05-15 MED ORDER — SPIRONOLACTONE 25 MG PO TABS: 25.0000 mg | ORAL_TABLET | Freq: Every day | ORAL | 0 refills | Status: DC

## 2020-05-15 NOTE — Telephone Encounter (Signed)
  Notes to clinic:  Script has expired  Review for continued use and refil   Requested Prescriptions  Pending Prescriptions Disp Refills   amLODipine (NORVASC) 5 MG tablet [Pharmacy Med Name: AMLODIPINE BESYLATE 5MG  TABLETS] 180 tablet 1    Sig: TAKE 1 TABLET BY MOUTH EVERY DAY      Cardiovascular:  Calcium Channel Blockers Passed - 05/15/2020  8:39 AM      Passed - Last BP in normal range    BP Readings from Last 1 Encounters:  05/02/20 132/78          Passed - Valid encounter within last 6 months    Recent Outpatient Visits           2 months ago Type 2 diabetes mellitus with other circulatory complication, without long-term current use of insulin [facility])   [facility] MD   4 months ago Type 2 diabetes mellitus with other circulatory complication, without long-term current use of insulin [facility])   [facility] MD   7 months ago Annual physical exam   [facility] MD   1 year ago Type 2 diabetes mellitus with other circulatory complication, without long-term current use of insulin [facility])   [facility] MD   1 year ago Acute pain of right shoulder   [facility], MD       Future Appointments             In 3 months Erasmo Downer., MD [facility]

## 2020-05-15 NOTE — Telephone Encounter (Signed)
Copied from CRM [id number]. Topic: General - Other >> May 15, 2020  8:56 AM Jaquita Rector A wrote: Reason for CRM: Patient called in asking to speak to Dr Sullivan Lone nurse say that he need to discuss a medication he need Dr Sullivan Lone to prescribe him. Please call Ph# [phone]

## 2020-05-15 NOTE — Telephone Encounter (Signed)
Spironolactone 25 mg daily--RTC 1 month to recheck and will need renal panel

## 2020-05-15 NOTE — Telephone Encounter (Signed)
Spoke with patient on phone who states that he has been suffering with hair loss for several years and states that he loses hair is circle like patches. Patient states that over weekend while getting hair cutt his stylist advised him that he has alopecia and recommended that he discuss with his PCP about starting Spironolactone to help with hair loss. Patient is wanting to know if medication can be prescribed or if an office visit would be needed. KW

## 2020-06-12 DIAGNOSIS — Z8616 Personal history of COVID-19: Secondary | ICD-10-CM

## 2020-06-12 HISTORY — DX: Personal history of COVID-19: Z86.16

## 2020-06-13 ENCOUNTER — Other Ambulatory Visit: Payer: Self-pay | Admitting: Family Medicine

## 2020-06-13 DIAGNOSIS — L659 Nonscarring hair loss, unspecified: Secondary | ICD-10-CM

## 2020-06-13 NOTE — Telephone Encounter (Signed)
   Notes to clinic:  Patient was due back in 1 month to recheck lab Review for another refill    Requested Prescriptions  Pending Prescriptions Disp Refills   spironolactone (ALDACTONE) 25 MG tablet [Pharmacy Med Name: SPIRONOLACTONE 25MG  TABLETS] 30 tablet 0    Sig: TAKE 1 TABLET(25 MG) BY MOUTH DAILY      Cardiovascular: Diuretics - Aldosterone Antagonist Passed - 06/13/2020 10:01 AM      Passed - Cr in normal range and within 360 days    Creat  Date Value Ref Range Status  10/07/2016 1.11 0.70 - 1.25 mg/dL Final    Comment:    For patients >56 years of age, the reference limit for Creatinine is approximately 13% higher for people identified as African-American. .    Creatinine, Ser  Date Value Ref Range Status  04/25/2020 0.89 0.61 - 1.24 mg/dL Final          Passed - K in normal range and within 360 days    Potassium  Date Value Ref Range Status  04/25/2020 4.7 3.5 - 5.1 mmol/L Final          Passed - Na in normal range and within 360 days    Sodium  Date Value Ref Range Status  04/25/2020 137 135 - 145 mmol/L Final  09/21/2019 136 134 - 144 mmol/L Final          Passed - Last BP in normal range    BP Readings from Last 1 Encounters:  05/02/20 132/78          Passed - Valid encounter within last 6 months    Recent Outpatient Visits           3 months ago Type 2 diabetes mellitus with other circulatory complication, without long-term current use of insulin [facility])   [facility] MD   5 months ago Type 2 diabetes mellitus with other circulatory complication, without long-term current use of insulin [facility])   [facility]., MD   8 months ago Annual physical exam   [facility] MD   1 year ago Type 2 diabetes mellitus with other circulatory complication, without long-term current use of insulin [facility])   [facility].,  MD   1 year ago Acute pain of right shoulder   [facility], MD       Future Appointments             In 2 months Erasmo Downer., MD [facility], [facility]

## 2020-07-02 ENCOUNTER — Other Ambulatory Visit: Payer: Self-pay | Admitting: Family Medicine

## 2020-07-02 DIAGNOSIS — E119 Type 2 diabetes mellitus without complications: Secondary | ICD-10-CM

## 2020-07-02 NOTE — Telephone Encounter (Signed)
   Notes to clinic: verify how many units for refill   Requested Prescriptions  Pending Prescriptions Disp Refills   TRESIBA FLEXTOUCH 100 UNIT/ML FlexTouch Pen [Pharmacy Med Name: TRESIBA FLEXTOUCH PEN ([id number]] 30 mL 1    Sig: ADMINISTER UP TO 30 UNITS UNDER THE SKIN DAILY AS DIRECTED      Endocrinology:  Diabetes - Insulins Passed - 07/02/2020 10:43 AM      Passed - HBA1C is between 0 and 7.9 and within 180 days    Hgb A1c MFr Bld  Date Value Ref Range Status  04/25/2020 7.1 (H) 4.8 - 5.6 % Final    Comment:    (NOTE) Pre diabetes:          5.7%-6.4%  Diabetes:              >6.4%  Glycemic control for   <7.0% adults with diabetes           Passed - Valid encounter within last 6 months    Recent Outpatient Visits           4 months ago Type 2 diabetes mellitus with other circulatory complication, without long-term current use of insulin [facility])   [facility] Maple Hudson., MD   6 months ago Type 2 diabetes mellitus with other circulatory complication, without long-term current use of insulin [facility])   [facility] Maple Hudson., MD   9 months ago Annual physical exam   [facility]., MD   1 year ago Type 2 diabetes mellitus with other circulatory complication, without long-term current use of insulin [facility])   [facility] Maple Hudson., MD   1 year ago Acute pain of right shoulder   [facility] Erasmo Downer, MD       Future Appointments             In 1 month Maple Hudson., MD [facility], [facility]

## 2020-08-13 ENCOUNTER — Other Ambulatory Visit: Payer: Self-pay | Admitting: Family Medicine

## 2020-08-13 NOTE — Telephone Encounter (Signed)
Requested medication (s) are due for refill today: Yes  Requested medication (s) are on the active medication list: Yes  Last refill:  08/08/19  Future visit scheduled: Yes  Notes to clinic:  Prescription expired.    Requested Prescriptions  Pending Prescriptions Disp Refills   BD PEN NEEDLE NANO 2ND GEN 32G X 4 MM MISC [Pharmacy Med Name: B-D NANO 2ND GEN PEN NDL [id number]] 100 each 11    Sig: USE AS DIRECTED WITH TRESIBA EVERY DAY      Endocrinology: Diabetes - Testing Supplies Passed - 08/13/2020  2:21 PM      Passed - Valid encounter within last 12 months    Recent Outpatient Visits           5 months ago Type 2 diabetes mellitus with other circulatory complication, without long-term current use of insulin [facility])   [facility] Maple Hudson., MD   7 months ago Type 2 diabetes mellitus with other circulatory complication, without long-term current use of insulin [facility])   [facility] Maple Hudson., MD   10 months ago Annual physical exam   [facility] Maple Hudson., MD   1 year ago Type 2 diabetes mellitus with other circulatory complication, without long-term current use of insulin [facility])   [facility] Maple Hudson., MD   1 year ago Acute pain of right shoulder   [facility] Erasmo Downer, MD       Future Appointments             In 2 weeks Maple Hudson., MD [facility], [facility]

## 2020-08-29 ENCOUNTER — Ambulatory Visit (INDEPENDENT_AMBULATORY_CARE_PROVIDER_SITE_OTHER): Payer: HMO | Admitting: Family Medicine

## 2020-08-29 ENCOUNTER — Other Ambulatory Visit: Payer: Self-pay

## 2020-08-29 ENCOUNTER — Encounter: Payer: Self-pay | Admitting: Family Medicine

## 2020-08-29 VITALS — BP 121/74 | HR 81 | Temp 98.6°F | Ht 70.0 in | Wt 193.0 lb

## 2020-08-29 DIAGNOSIS — I10 Essential (primary) hypertension: Secondary | ICD-10-CM | POA: Diagnosis not present

## 2020-08-29 DIAGNOSIS — H903 Sensorineural hearing loss, bilateral: Secondary | ICD-10-CM

## 2020-08-29 DIAGNOSIS — G4733 Obstructive sleep apnea (adult) (pediatric): Secondary | ICD-10-CM | POA: Diagnosis not present

## 2020-08-29 DIAGNOSIS — E1159 Type 2 diabetes mellitus with other circulatory complications: Secondary | ICD-10-CM

## 2020-08-29 DIAGNOSIS — Z23 Encounter for immunization: Secondary | ICD-10-CM | POA: Diagnosis not present

## 2020-08-29 DIAGNOSIS — I2510 Atherosclerotic heart disease of native coronary artery without angina pectoris: Secondary | ICD-10-CM

## 2020-08-29 DIAGNOSIS — Z0000 Encounter for general adult medical examination without abnormal findings: Secondary | ICD-10-CM

## 2020-08-29 NOTE — Progress Notes (Signed)
Welcome to Medicare visit     Patient: Samuel Gentry, Male    DOB: 07-Nov-1955, 65 y.o.   MRN: [id number] Visit Date: 08/29/2020  Today's Provider: [name], MD   Chief Complaint  Patient presents with   Medicare Wellness   Subjective    Samuel Gentry is a 65 y.o. male who presents today for his Welcome to Medicare visit. He reports consuming a general diet.  Exercises regularly  He generally feels well. He reports sleeping well.  To have decreased hearing left greater than right. Already has diabetes he did do well on Trulicity we will consider trying this again.      Medications: Outpatient Medications Prior to Visit  Medication Sig   amLODipine (NORVASC) 5 MG tablet Take 1 tablet (5 mg total) by mouth daily.   aspirin 325 MG tablet Take 325 mg by mouth daily.    BD PEN NEEDLE NANO 2ND GEN 32G X 4 MM MISC USE AS DIRECTED WITH TRESIBA EVERY DAY   Blood Glucose Monitoring Suppl (CONTOUR NEXT MONITOR) w/Device KIT USE AS DIRECTED   CONTOUR NEXT TEST test strip CHECK SUGAR ONCE DAILY AS DIRECTED   lisinopril (ZESTRIL) 20 MG tablet TAKE 1 TABLET(20 MG) BY MOUTH DAILY (Patient taking differently: Take by mouth daily.)   metFORMIN (GLUCOPHAGE) 1000 MG tablet TAKE 1 TABLET BY MOUTH TWICE DAILY WITH MEALS (Patient taking differently: Take 1,000 mg by mouth 2 (two) times daily with a meal.)   MICROLET LANCETS MISC USE AS DIRECTED ONCE DAILY   MULTIPLE VITAMINS PO Take 1 tablet by mouth daily.   omeprazole (PRILOSEC) 20 MG capsule Take 1 capsule (20 mg total) by mouth 2 (two) times daily before a meal. (Patient taking differently: Take 20 mg by mouth daily.)   rosuvastatin (CRESTOR) 40 MG tablet TAKE 1 TABLET(40 MG) BY MOUTH DAILY (Patient taking differently: Take 40 mg by mouth daily. TAKE 1 TABLET(40 MG) BY MOUTH DAILY)   sertraline (ZOLOFT) 50 MG tablet TAKE 1 TABLET(50 MG) BY MOUTH DAILY (Patient taking differently: Take 50 mg by mouth daily.)   tadalafil  (CIALIS) 20 MG tablet Take 1 tablet (20 mg total) by mouth every three (3) days as needed for erectile dysfunction.   TRESIBA FLEXTOUCH 100 UNIT/ML FlexTouch Pen ADMINISTER UP TO 30 UNITS UNDER THE SKIN DAILY AS DIRECTED   oxyCODONE-acetaminophen (PERCOCET) 5-325 MG tablet Take 1-2 tablets every 4 hours as needed for post operative pain. MAX 6/day   spironolactone (ALDACTONE) 25 MG tablet TAKE 1 TABLET(25 MG) BY MOUTH DAILY   tiZANidine (ZANAFLEX) 4 MG tablet Take 1 tablet (4 mg total) by mouth every 8 (eight) hours as needed for muscle spasms.   No facility-administered medications prior to visit.    Allergies  Allergen Reactions   Prednisone Other (See Comments)    Makes him very mean    Patient Care Team: Jerrol Banana., MD as PCP - General (Unknown Physician Specialty) Minna Merritts, MD as Consulting Physician (Cardiology) Jerrol Banana., MD (Family Medicine) Bary Castilla, Forest Gleason, MD (General Surgery) Cathi Roan, [facility] (Inactive) (Pharmacist)  Review of Systems  Constitutional: Negative.   HENT:  Positive for hearing loss. Negative for congestion, dental problem, drooling, ear discharge, ear pain, facial swelling, mouth sores, nosebleeds, postnasal drip, rhinorrhea, sinus pressure, sinus pain, sneezing, sore throat, tinnitus, trouble swallowing and voice change.   Eyes: Negative.   Respiratory: Negative.    Cardiovascular: Negative.   Gastrointestinal: Negative.  Endocrine: Negative.   Genitourinary: Negative.   Musculoskeletal:  Positive for arthralgias, back pain and joint swelling. Negative for gait problem, myalgias, neck pain and neck stiffness.  Skin: Negative.   Allergic/Immunologic: Negative.   Neurological: Negative.   Hematological: Negative.   Psychiatric/Behavioral: Negative.         Objective    Vitals: BP 121/74 (BP Location: Left Arm, Patient Position: Sitting, Cuff Size: Large)   Pulse 81   Temp 98.6 F (37 C) (Oral)   Ht 5' 10"   (1.778 m)   Wt 193 lb (87.5 kg)   SpO2 97%   BMI 27.69 kg/m    Physical Exam Vitals reviewed.  Constitutional:      Appearance: Normal appearance.  HENT:     Head: Normocephalic and atraumatic.     Right Ear: External ear normal.     Left Ear: External ear normal.  Eyes:     General: No scleral icterus.    Conjunctiva/sclera: Conjunctivae normal.  Cardiovascular:     Rate and Rhythm: Normal rate and regular rhythm.     Pulses: Normal pulses.     Heart sounds: Normal heart sounds.  Pulmonary:     Effort: Pulmonary effort is normal.     Breath sounds: Normal breath sounds.  Genitourinary:    Penis: Normal.      Testes: Normal.     Prostate: Normal.     Rectum: Normal.  Musculoskeletal:     Right lower leg: No edema.     Left lower leg: No edema.  Skin:    General: Skin is warm and dry.  Neurological:     General: No focal deficit present.     Mental Status: He is alert and oriented to person, place, and time.  Psychiatric:        Mood and Affect: Mood normal.        Behavior: Behavior normal.        Thought Content: Thought content normal.        Judgment: Judgment normal.     Most recent functional status assessment: In your present state of health, do you have any difficulty performing the following activities: 04/25/2020  Hearing? Y  Vision? N  Difficulty concentrating or making decisions? N  Walking or climbing stairs? N  Dressing or bathing? N  Doing errands, shopping? N  Some recent data might be hidden   Most recent fall risk assessment: Fall Risk  02/22/2020  Falls in the past year? 0  Number falls in past yr: 0  Injury with Fall? 0  Follow up Falls evaluation completed    Most recent depression screenings: PHQ 2/9 Scores 02/22/2020 09/21/2019  PHQ - 2 Score 0 0  PHQ- 9 Score 0 2   Most recent cognitive screening: 6CIT Screen 08/29/2020  What Year? 0 points  What month? 0 points  What time? 0 points  Count back from 20 0 points  Months in  reverse 0 points  Repeat phrase 0 points  Total Score 0   Most recent Audit-C alcohol use screening Alcohol Use Disorder Test (AUDIT) 02/22/2020  1. How often do you have a drink containing alcohol? 0  2. How many drinks containing alcohol do you have on a typical day when you are drinking? 0  3. How often do you have six or more drinks on one occasion? 0  AUDIT-C Score 0  Alcohol Brief Interventions/Follow-up AUDIT Score <7 follow-up not indicated   A score of 3 or more  in women, and 4 or more in men indicates increased risk for alcohol abuse, EXCEPT if all of the points are from question 1   No results found for any visits on 08/29/20.  Assessment & Plan     Annual wellness visit done today including the all of the following: Reviewed patient's Family Medical History Reviewed and updated list of patient's medical providers Assessment of cognitive impairment was done Assessed patient's functional ability Established a written schedule for health screening [place] Completed and Reviewed  Exercise Activities and Dietary recommendations  Goals       "I need help affording my medications" (pt-stated)      Pharmacist Clinical Goal(s): Over the next 10 days, Mr.. Ploch will provide the necessary supplementary documents (proof of out of pocket prescription expenditure, proof of household income) needed for medication assistance applications to CCM pharmacist.   Interventions:  CCM pharmacist will investigate different medication assistance foundations and programs for Mr. Kuenzel to apply for, primarily for Trulicity and [place].   Partially completed: 02/21/2018, patient has been approved for medication assistance for Trulicity and [place] Aas, expires 01 28 2021  Updated 06/20/18: CCM pharmacist completed refill, copy uploaded under media tab.  As of 02/28/18, patient was denied for [place] assistance [facility]) for "over income"; contacted Marjory Lies, Wellsite geologist for  [employer], who recommended sending in denial letter for redetermination. Decision will be made within 2 weeks   03/14/18: Update from [employer], representative [place] on 02/28/18 provided incorrect information, patient needs to supply a financial hardship letter and an out of pocket test claim from the patient's pharmacy.   Current Barriers:  No prescription insurance coverage Not able to use manufacturer's coupons due to lack of commercial coverage  Patient Self Care Activities:  Collaborate with CCM pharmacist and provide necessary paperwork    Please see past updates related to this goal by clicking on the "Past Updates" button in the selected goal          EG reveals normal sinus rhythm at about 82 with no ischemic changes Immunization History  Administered Date(s) Administered   Influenza,inj,Quad PF,6+ Mos 11/25/2015, 10/07/2016, 10/14/2017, 09/21/2019   Pneumococcal Polysaccharide-23 10/07/2016   Tdap 01/02/2010    Health Maintenance  Topic Date Due   COVID-19 Vaccine (1) Never done   Zoster Vaccines- Shingrix (1 of 2) Never done   FOOT EXAM  10/07/2017   TETANUS/TDAP  01/03/2020   INFLUENZA VACCINE  08/12/2020   PNA vac Low Risk Adult (1 of 2 - PCV13) 08/25/2020   HEMOGLOBIN A1C  10/25/2020   OPHTHALMOLOGY EXAM  03/26/2021   COLONOSCOPY (Pts 45-10yr Insurance coverage will need to be confirmed)  11/05/2025   Hepatitis C Screening  Completed   HIV Screening  Completed   HPV VACCINES  Aged Out   1. Welcome to Medicare preventive visit  - EKG 12-Lead - Pneumococcal conjugate vaccine 20-valent (Prevnar 20)  2. CAD in native artery Risk factors treate  3. Benign essential HTN   4. OSA (obstructive sleep apnea)   5. Type 2 diabetes mellitus with other circulatory complication, without long-term current use of insulin (HCC) Start Trulicity 00.07mg weekly and decrease Tresiba from 44 to 35 units daily and try to get continuous glucose monitor through  insurance  6. Sensorineural hearing loss (SNHL) of both ears    Discussed health benefits of physical activity, and encouraged him to engage in regular exercise appropriate for his age and condition.  No follow-ups on file.     I, [name], MD, have reviewed all documentation for this visit. The documentation on 09/07/20 for the exam, diagnosis, procedures, and orders are all accurate and complete.    Venesha Petraitis Cranford Mon, MD  [facility] [phone] (phone) [phone] (fax)  [place]

## 2020-09-03 ENCOUNTER — Telehealth: Payer: Self-pay | Admitting: Family Medicine

## 2020-09-03 DIAGNOSIS — E1159 Type 2 diabetes mellitus with other circulatory complications: Secondary | ICD-10-CM

## 2020-09-03 DIAGNOSIS — L570 Actinic keratosis: Secondary | ICD-10-CM

## 2020-09-03 MED ORDER — TRULICITY 0.75 MG/0.5ML ~~LOC~~ SOAJ: 0.7500 mg | SUBCUTANEOUS | 3 refills | Status: DC

## 2020-09-03 NOTE — Telephone Encounter (Signed)
Medication sent into the pharmacy. Order for referral placed.

## 2020-09-03 NOTE — Telephone Encounter (Signed)
Dr. Sullivan Lone, what dose of trulicity did you want him to take? I thought we sent this in but I dont see that we have. And do you remember what the derm referral was for?

## 2020-09-03 NOTE — Telephone Encounter (Signed)
Pt saw Dr Sullivan Lone 8/18.  Pt states Dr Sullivan Lone was going to prescribe Trulicity .  It is not at the pharmacy.  [id number] - [place], [place] - [address] AT [facility] OF [address]  Pt states he was to also have a referral to dermatology.

## 2020-09-11 ENCOUNTER — Telehealth: Payer: Self-pay | Admitting: Family Medicine

## 2020-09-11 ENCOUNTER — Other Ambulatory Visit: Payer: Self-pay | Admitting: Family Medicine

## 2020-09-11 DIAGNOSIS — E119 Type 2 diabetes mellitus without complications: Secondary | ICD-10-CM

## 2020-09-11 NOTE — Telephone Encounter (Signed)
Copied from CRM [id number]. Topic: Quick Communication - Rx Refill/Question >> Sep 11, 2020  4:45 PM Marylen Ponto wrote: Medication: CONTOUR NEXT TEST test strip  Has the patient contacted their pharmacy? Yes.   (Agent: If no, request that the patient contact the pharmacy for the refill.) (Agent: If yes, when and what did the pharmacy advise?)  Preferred Pharmacy (with phone number or street name): [id number] [place], [place] - [address] AT [facility] OF [address] [facility] Phone: [phone]   Fax: [phone]  Agent: Please be advised that RX refills may take up to 3 business days. We ask that you follow-up with your pharmacy.

## 2020-09-11 NOTE — Telephone Encounter (Signed)
Requested medication (s) are due for refill today: expired medication  Requested medication (s) are on the active medication list: yes  Last refill:  06/07/2018 #100 each 5 refills   Future visit scheduled: yes in 1 month   Notes to clinic:  expired medication     Requested Prescriptions  Pending Prescriptions Disp Refills   glucose blood (CONTOUR NEXT TEST) test strip 100 each 5    Sig: Use as instructed     Endocrinology: Diabetes - Testing Supplies Passed - 09/11/2020  5:29 PM      Passed - Valid encounter within last 12 months    Recent Outpatient Visits           1 week ago Welcome to [employer] preventive visit   [facility] Maple Hudson., MD   6 months ago Type 2 diabetes mellitus with other circulatory complication, without long-term current use of insulin [facility])   [facility] Maple Hudson., MD   8 months ago Type 2 diabetes mellitus with other circulatory complication, without long-term current use of insulin [facility])   [facility] Maple Hudson., MD   11 months ago Annual physical exam   [facility] Maple Hudson., MD   1 year ago Type 2 diabetes mellitus with other circulatory complication, without long-term current use of insulin [facility])   [facility] Maple Hudson., MD       Future Appointments             In 1 month Maple Hudson., MD [facility]

## 2020-09-13 MED ORDER — CONTOUR NEXT TEST VI STRP: ORAL_STRIP | 5 refills | Status: DC

## 2020-09-13 NOTE — Telephone Encounter (Signed)
Patient is completely out of test strips. Says he has been trying to get this refilled X 1 week no.    Please call in strips to [facility] in [place]

## 2020-09-13 NOTE — Telephone Encounter (Signed)
Pt following up refill request for his test strips. Pt states he is completely out and cannot test his sugar.

## 2020-09-17 ENCOUNTER — Other Ambulatory Visit: Payer: Self-pay | Admitting: *Deleted

## 2020-09-17 DIAGNOSIS — E1159 Type 2 diabetes mellitus with other circulatory complications: Secondary | ICD-10-CM

## 2020-09-17 MED ORDER — BLOOD GLUCOSE METER KIT: PACK | 0 refills | Status: AC

## 2020-09-17 NOTE — Telephone Encounter (Signed)
Meter and supplies sent to pharmacy  

## 2020-09-17 NOTE — Telephone Encounter (Signed)
Pts insurance will not cover glucose blood (CONTOUR NEXT TEST) test strip / Pt needs to have a new Rx for Freestyle. Precision or Onetouch meter and strips/ these are the three that can be covered /please advise asap and send to [name] [place], [place] - [address] AT [facility] OF [address] [facility]  [address] [place], [place] [address]  Phone:  [phone]  Fax:  [phone]

## 2020-10-09 ENCOUNTER — Other Ambulatory Visit: Payer: Self-pay | Admitting: Family Medicine

## 2020-10-09 DIAGNOSIS — F320 Major depressive disorder, single episode, mild: Secondary | ICD-10-CM

## 2020-10-30 ENCOUNTER — Ambulatory Visit (INDEPENDENT_AMBULATORY_CARE_PROVIDER_SITE_OTHER): Payer: HMO | Admitting: Family Medicine

## 2020-10-30 ENCOUNTER — Other Ambulatory Visit: Payer: Self-pay

## 2020-10-30 VITALS — BP 116/81 | HR 71 | Temp 97.6°F | Wt 196.0 lb

## 2020-10-30 DIAGNOSIS — E782 Mixed hyperlipidemia: Secondary | ICD-10-CM | POA: Diagnosis not present

## 2020-10-30 DIAGNOSIS — M79642 Pain in left hand: Secondary | ICD-10-CM | POA: Diagnosis not present

## 2020-10-30 DIAGNOSIS — Z125 Encounter for screening for malignant neoplasm of prostate: Secondary | ICD-10-CM

## 2020-10-30 DIAGNOSIS — E1159 Type 2 diabetes mellitus with other circulatory complications: Secondary | ICD-10-CM | POA: Diagnosis not present

## 2020-10-30 DIAGNOSIS — R1310 Dysphagia, unspecified: Secondary | ICD-10-CM | POA: Diagnosis not present

## 2020-10-30 DIAGNOSIS — I889 Nonspecific lymphadenitis, unspecified: Secondary | ICD-10-CM

## 2020-10-30 DIAGNOSIS — Z23 Encounter for immunization: Secondary | ICD-10-CM | POA: Diagnosis not present

## 2020-10-30 DIAGNOSIS — I10 Essential (primary) hypertension: Secondary | ICD-10-CM | POA: Diagnosis not present

## 2020-10-30 MED ORDER — AMOXICILLIN-POT CLAVULANATE 875-125 MG PO TABS: 1.0000 | ORAL_TABLET | Freq: Two times a day (BID) | ORAL | 0 refills | Status: DC

## 2020-10-30 NOTE — Progress Notes (Signed)
Established patient visit   Patient: Samuel Gentry   DOB: 07/21/1955   65 y.o. Male  MRN: [id number] Visit Date: 10/30/2020  Today's healthcare provider: [name], MD   No chief complaint on file.  Subjective    HPI  Patient comes in today for follow-up of chronic medical problems.  Continues to have chronic hand and wrist pain. His diabetes is fairly stable as far as he knows.  He has no cardiovascular symptoms. Having hearing loss left greater than right. States recently in the past month or so it hurts to swallow at times with it being high up in his throat that it hurts.  He has had no food dysphagia no choking on food.  Currently seems to be more of a problem with solid food than with liquids.  Diabetes Mellitus Type II, Follow-up  Lab Results  Component Value Date   HGBA1C 7.1 (H) 04/25/2020   HGBA1C 7.3 (A) 02/22/2020   HGBA1C 9.4 (A) 12/28/2019   Wt Readings from Last 3 Encounters:  10/30/20 196 lb (88.9 kg)  08/29/20 193 lb (87.5 kg)  02/22/20 197 lb (89.4 kg)   Last seen for diabetes 2 months ago.  Management since then includes starting Trulicity and decreasing [place]. He reports good compliance with treatment. He is having side effects.  Symptoms: No fatigue No foot ulcerations  No appetite changes No nausea  No paresthesia of the feet  No polydipsia  No polyuria No visual disturbances   No vomiting     Home blood sugar records:  95-160  Episodes of hypoglycemia? No   Most Recent Eye Exam: 03/2020.  Patient is due for diabetic foot exam   Pertinent Labs: Lab Results  Component Value Date   CHOL 154 09/21/2019   HDL 38 (L) 09/21/2019   LDLCALC 95 09/21/2019   TRIG 118 09/21/2019   CHOLHDL 4.1 09/21/2019   Lab Results  Component Value Date   NA 137 04/25/2020   K 4.7 04/25/2020   CREATININE 0.89 04/25/2020   GFRNONAA >60 04/25/2020   GLUCOSE 142 (H) 04/25/2020      ---------------------------------------------------------------------------------------------------  Medications: Outpatient Medications Prior to Visit  Medication Sig   amLODipine (NORVASC) 5 MG tablet Take 1 tablet (5 mg total) by mouth daily.   aspirin 325 MG tablet Take 325 mg by mouth daily.    BD PEN NEEDLE NANO 2ND GEN 32G X 4 MM MISC USE AS DIRECTED WITH TRESIBA EVERY DAY   blood glucose meter kit and supplies Dispense based on patient and insurance preference. Use up to once daily as directed. (FOR ICD-10 E11.59).   Dulaglutide (TRULICITY) 4.43 XV/4.0GQ SOPN Inject 0.75 mg into the skin once a week.   glucose blood (CONTOUR NEXT TEST) test strip CHECK SUGAR ONCE DAILY AS DIRECTED   lisinopril (ZESTRIL) 20 MG tablet TAKE 1 TABLET(20 MG) BY MOUTH DAILY (Patient taking differently: Take by mouth daily.)   metFORMIN (GLUCOPHAGE) 1000 MG tablet TAKE 1 TABLET BY MOUTH TWICE DAILY WITH MEALS   MICROLET LANCETS MISC USE AS DIRECTED ONCE DAILY   MULTIPLE VITAMINS PO Take 1 tablet by mouth daily.   omeprazole (PRILOSEC) 20 MG capsule Take 1 capsule (20 mg total) by mouth 2 (two) times daily before a meal. (Patient taking differently: Take 20 mg by mouth daily.)   rosuvastatin (CRESTOR) 40 MG tablet TAKE 1 TABLET(40 MG) BY MOUTH DAILY (Patient taking differently: Take 40 mg by mouth daily. TAKE 1 TABLET(40 MG) BY  MOUTH DAILY)   sertraline (ZOLOFT) 50 MG tablet TAKE 1 TABLET(50 MG) BY MOUTH DAILY (Patient taking differently: Take 50 mg by mouth daily.)   tadalafil (CIALIS) 20 MG tablet Take 1 tablet (20 mg total) by mouth every three (3) days as needed for erectile dysfunction.   TRESIBA FLEXTOUCH 100 UNIT/ML FlexTouch Pen ADMINISTER UP TO 30 UNITS UNDER THE SKIN DAILY AS DIRECTED   oxyCODONE-acetaminophen (PERCOCET) 5-325 MG tablet Take 1-2 tablets every 4 hours as needed for post operative pain. MAX 6/day   spironolactone (ALDACTONE) 25 MG tablet TAKE 1 TABLET(25 MG) BY MOUTH DAILY    tiZANidine (ZANAFLEX) 4 MG tablet Take 1 tablet (4 mg total) by mouth every 8 (eight) hours as needed for muscle spasms.   No facility-administered medications prior to visit.    Review of Systems      Objective    BP 116/81 (BP Location: Right Arm, Patient Position: Sitting, Cuff Size: Large)   Pulse 71   Temp 97.6 F (36.4 C) (Oral)   Wt 196 lb (88.9 kg)   SpO2 100%   BMI 28.12 kg/m     Physical Exam Vitals reviewed.  Constitutional:      Appearance: He is well-developed.  HENT:     Head: Normocephalic and atraumatic.     Right Ear: External ear normal.     Left Ear: External ear normal.  Eyes:     General: No scleral icterus.    Conjunctiva/sclera: Conjunctivae normal.  Neck:     Thyroid: No thyromegaly.  Cardiovascular:     Rate and Rhythm: Normal rate and regular rhythm.     Heart sounds: Normal heart sounds.  Pulmonary:     Effort: Pulmonary effort is normal.     Breath sounds: Normal breath sounds.  Abdominal:     Palpations: Abdomen is soft.  Musculoskeletal:     Comments: Some mild tenderness at the base of the thumbs.  Skin:    General: Skin is warm and dry.  Neurological:     General: No focal deficit present.     Mental Status: He is alert and oriented to person, place, and time.  Psychiatric:        Mood and Affect: Mood normal.        Behavior: Behavior normal.        Thought Content: Thought content normal.        Judgment: Judgment normal.      No results found for any visits on 10/30/20.  Assessment & Plan     1. Benign essential HTN Controlled on amlodipine 5 and lisinopril 20 and spironolactone 25 - CBC with Differential/Platelet - Comprehensive metabolic panel - Lipid Panel With LDL/HDL Ratio - TSH  2. Type 2 diabetes mellitus with other circulatory complication, without long-term current use of insulin (HCC) Controlled on illicitly 0.75 metformin 1000 mg twice a day proceed with 30 units daily - Comprehensive metabolic panel -  Lipid Panel With LDL/HDL Ratio - Hemoglobin A1c  3. Mixed hyperlipidemia On rosuvastatin 40 for CAD - Lipid Panel With LDL/HDL Ratio  4. Need for influenza vaccination  - Flu Vaccine QUAD High Dose(Fluad)  5. Prostate cancer screening  - PSA  6. Dysphagia, unspecified type Has been ongoing worsening problem for a couple of months.  Refer to ENT.  Definitely describes this as an upper issue with initiation of swallowing - Ambulatory referral to ENT  7. Pain of left hand \To hand surgery - Ambulatory referral to Orthopedic Surgery    8. Lymphadenitis  - amoxicillin-clavulanate (AUGMENTIN) 875-125 MG tablet; Take 1 tablet by mouth 2 (two) times daily.  Dispense: 20 tablet; Refill: 0   No follow-ups on file.      I, [name], MD, have reviewed all documentation for this visit. The documentation on 11/05/20 for the exam, diagnosis, procedures, and orders are all accurate and complete.    Delmar Arriaga Cranford Mon, MD  [facility] [phone] (phone) [phone] (fax)  [place]

## 2020-10-30 NOTE — Patient Instructions (Signed)
Turmeric daily and over the counter Voltaren cream for hands

## 2020-10-31 LAB — CBC WITH DIFFERENTIAL/PLATELET
Basophils Absolute: 0 10*3/uL (ref 0.0–0.2)
Basos: 1 %
EOS (ABSOLUTE): 0.2 10*3/uL (ref 0.0–0.4)
Eos: 3 %
Hematocrit: 38.5 % (ref 37.5–51.0)
Hemoglobin: 13.1 g/dL (ref 13.0–17.7)
Immature Grans (Abs): 0 10*3/uL (ref 0.0–0.1)
Immature Granulocytes: 0 %
Lymphocytes Absolute: 1.7 10*3/uL (ref 0.7–3.1)
Lymphs: 30 %
MCH: 29 pg (ref 26.6–33.0)
MCHC: 34 g/dL (ref 31.5–35.7)
MCV: 85 fL (ref 79–97)
Monocytes Absolute: 0.5 10*3/uL (ref 0.1–0.9)
Monocytes: 9 %
Neutrophils Absolute: 3.3 10*3/uL (ref 1.4–7.0)
Neutrophils: 57 %
Platelets: 223 10*3/uL (ref 150–450)
RBC: 4.51 x10E6/uL (ref 4.14–5.80)
RDW: 12.7 % (ref 11.6–15.4)
WBC: 5.8 10*3/uL (ref 3.4–10.8)

## 2020-10-31 LAB — COMPREHENSIVE METABOLIC PANEL
ALT: 30 IU/L (ref 0–44)
AST: 34 IU/L (ref 0–40)
Albumin/Globulin Ratio: 1.8 (ref 1.2–2.2)
Albumin: 4.7 g/dL (ref 3.8–4.8)
Alkaline Phosphatase: 71 IU/L (ref 44–121)
BUN/Creatinine Ratio: 25 — ABNORMAL HIGH (ref 10–24)
BUN: 28 mg/dL — ABNORMAL HIGH (ref 8–27)
Bilirubin Total: 0.3 mg/dL (ref 0.0–1.2)
CO2: 19 mmol/L — ABNORMAL LOW (ref 20–29)
Calcium: 10.1 mg/dL (ref 8.6–10.2)
Chloride: 103 mmol/L (ref 96–106)
Creatinine, Ser: 1.1 mg/dL (ref 0.76–1.27)
Globulin, Total: 2.6 g/dL (ref 1.5–4.5)
Glucose: 110 mg/dL — ABNORMAL HIGH (ref 70–99)
Potassium: 5.3 mmol/L — ABNORMAL HIGH (ref 3.5–5.2)
Sodium: 139 mmol/L (ref 134–144)
Total Protein: 7.3 g/dL (ref 6.0–8.5)
eGFR: 74 mL/min/{1.73_m2} (ref >59)

## 2020-10-31 LAB — TSH: TSH: 2.37 u[IU]/mL (ref 0.450–4.500)

## 2020-10-31 LAB — LIPID PANEL WITH LDL/HDL RATIO
Cholesterol, Total: 145 mg/dL (ref 100–199)
HDL: 42 mg/dL (ref >39)
LDL Chol Calc (NIH): 88 mg/dL (ref 0–99)
LDL/HDL Ratio: 2.1 ratio (ref 0.0–3.6)
Triglycerides: 76 mg/dL (ref 0–149)
VLDL Cholesterol Cal: 15 mg/dL (ref 5–40)

## 2020-10-31 LAB — PSA: Prostate Specific Ag, Serum: 1.6 ng/mL (ref 0.0–4.0)

## 2020-10-31 LAB — HEMOGLOBIN A1C
Est. average glucose Bld gHb Est-mCnc: 143 mg/dL
Hgb A1c MFr Bld: 6.6 % — ABNORMAL HIGH (ref 4.8–5.6)

## 2020-11-12 DIAGNOSIS — M65332 Trigger finger, left middle finger: Secondary | ICD-10-CM | POA: Diagnosis not present

## 2020-11-12 DIAGNOSIS — M65322 Trigger finger, left index finger: Secondary | ICD-10-CM | POA: Diagnosis not present

## 2020-11-28 ENCOUNTER — Other Ambulatory Visit: Payer: Self-pay | Admitting: Family Medicine

## 2020-11-28 DIAGNOSIS — E782 Mixed hyperlipidemia: Secondary | ICD-10-CM

## 2020-11-28 NOTE — Telephone Encounter (Signed)
Requested medication (s) are due for refill today: Yes  Requested medication (s) are on the active medication list: Yes  Last refill:  06/09/19  Future visit scheduled: Yes  Notes to clinic:  Prescription expired.    Requested Prescriptions  Pending Prescriptions Disp Refills   rosuvastatin (CRESTOR) 40 MG tablet [Pharmacy Med Name: ROSUVASTATIN 40MG  TABLETS] 30 tablet 11    Sig: TAKE 1 TABLET(40 MG) BY MOUTH DAILY     Cardiovascular:  Antilipid - Statins Passed - 11/28/2020  8:19 AM      Passed - Total Cholesterol in normal range and within 360 days    Cholesterol, Total  Date Value Ref Range Status  10/30/2020 145 100 - 199 mg/dL Final          Passed - LDL in normal range and within 360 days    LDL Cholesterol (Calc)  Date Value Ref Range Status  10/07/2016 121 (H) mg/dL (calc) Final    Comment:    Reference range: <100 . Desirable range <100 mg/dL for primary prevention;   <70 mg/dL for patients with CHD or diabetic patients  with > or = 2 CHD risk factors. 10/09/2016 LDL-C is now calculated using the Martin-Hopkins  calculation, which is a validated novel method providing  better accuracy than the Friedewald equation in the  estimation of LDL-C.  Marland Kitchen et al. Horald Pollen. Lenox Ahr): 2061-2068  (http://education.[employer].com/faq/FAQ164)    LDL Chol Calc (NIH)  Date Value Ref Range Status  10/30/2020 88 0 - 99 mg/dL Final          Passed - HDL in normal range and within 360 days    HDL  Date Value Ref Range Status  10/30/2020 42 >39 mg/dL Final          Passed - Triglycerides in normal range and within 360 days    Triglycerides  Date Value Ref Range Status  10/30/2020 76 0 - 149 mg/dL Final          Passed - Patient is not pregnant      Passed - Valid encounter within last 12 months    Recent Outpatient Visits           4 weeks ago Benign essential HTN   [facility]   3 months ago Welcome to [place]  preventive visit   [facility] MD   9 months ago Type 2 diabetes mellitus with other circulatory complication, without long-term current use of insulin [facility])   [facility] MD   11 months ago Type 2 diabetes mellitus with other circulatory complication, without long-term current use of insulin [facility])   [facility] MD   1 year ago Annual physical exam   [facility] MD       Future Appointments             In 3 months [place], MD [facility]   In 4 months Deirdre Evener., MD [facility]

## 2020-12-05 ENCOUNTER — Other Ambulatory Visit: Payer: Self-pay | Admitting: Family Medicine

## 2020-12-05 DIAGNOSIS — E119 Type 2 diabetes mellitus without complications: Secondary | ICD-10-CM

## 2020-12-06 NOTE — Telephone Encounter (Signed)
Requested Prescriptions  Pending Prescriptions Disp Refills  . metFORMIN (GLUCOPHAGE) 1000 MG tablet [Pharmacy Med Name: METFORMIN 1000MG TABLETS] 180 tablet 0    Sig: TAKE 1 TABLET BY MOUTH TWICE DAILY WITH MEALS     Endocrinology:  Diabetes - Biguanides Passed - 12/05/2020 11:38 AM      Passed - Cr in normal range and within 360 days    Creat  Date Value Ref Range Status  10/07/2016 1.11 0.70 - 1.25 mg/dL Final    Comment:    For patients >65 years of age, the reference limit for Creatinine is approximately 13% higher for people identified as African-American. .    Creatinine, Ser  Date Value Ref Range Status  10/30/2020 1.10 0.76 - 1.27 mg/dL Final         Passed - HBA1C is between 0 and 7.9 and within 180 days    Hgb A1c MFr Bld  Date Value Ref Range Status  10/30/2020 6.6 (H) 4.8 - 5.6 % Final    Comment:             Prediabetes: 5.7 - 6.4          Diabetes: >6.4          Glycemic control for adults with diabetes: <7.0          Passed - eGFR in normal range and within 360 days    GFR calc Af Amer  Date Value Ref Range Status  09/21/2019 94 >59 mL/min/1.73 Final    Comment:    **[facility] currently reports eGFR in compliance with the current**   recommendations of the [employer]. [facility] will   update reporting as new guidelines are published from the NKF-ASN   Task force.    GFR, Estimated  Date Value Ref Range Status  04/25/2020 >60 >60 mL/min Final    Comment:    (NOTE) Calculated using the CKD-EPI Creatinine Equation (2021)    eGFR  Date Value Ref Range Status  10/30/2020 74 >59 mL/min/1.73 Final         Passed - Valid encounter within last 6 months    Recent Outpatient Visits          1 month ago Benign essential HTN   [facility] Jerrol Banana., MD   3 months ago Welcome to [employer] preventive visit   [facility] Jerrol Banana., MD   9 months ago Type 2 diabetes mellitus with other  circulatory complication, without long-term current use of insulin [facility])   [facility] Jerrol Banana., MD   11 months ago Type 2 diabetes mellitus with other circulatory complication, without long-term current use of insulin [facility])   [facility] Jerrol Banana., MD   1 year ago Annual physical exam   [facility] Jerrol Banana., MD      Future Appointments            In 3 months Ralene Bathe, MD [place]   In 4 months Jerrol Banana., MD [facility], [facility]

## 2020-12-13 ENCOUNTER — Other Ambulatory Visit: Payer: Self-pay | Admitting: Family Medicine

## 2020-12-14 NOTE — Telephone Encounter (Signed)
Requested Prescriptions  Pending Prescriptions Disp Refills  . Lancets (ONETOUCH DELICA PLUS LANCET33G) MISC [Pharmacy Med Name: ONE TOUCH DELICA PLUS 33G LANCETS] 100 each 3    Sig: TEST ONCE DAILY     Endocrinology: Diabetes - Testing Supplies Passed - 12/13/2020 11:23 AM      Passed - Valid encounter within last 12 months    Recent Outpatient Visits          1 month ago Benign essential HTN   [facility] [place]., [place]   3 months ago Welcome to [employer] preventive visit   [facility] [place]., MD   9 months ago Type 2 diabetes mellitus with other circulatory complication, without long-term current use of insulin [facility])   [facility] [place]., MD   12 months ago Type 2 diabetes mellitus with other circulatory complication, without long-term current use of insulin [facility])   [facility] [place]., MD   1 year ago Annual physical exam   [facility] [place]., MD      Future Appointments            In 2 months Deirdre Evener, MD [facility]   In 3 months [place]., MD [facility]

## 2020-12-17 ENCOUNTER — Other Ambulatory Visit: Payer: Self-pay | Admitting: Family Medicine

## 2020-12-17 DIAGNOSIS — M65332 Trigger finger, left middle finger: Secondary | ICD-10-CM | POA: Diagnosis not present

## 2020-12-17 DIAGNOSIS — M65322 Trigger finger, left index finger: Secondary | ICD-10-CM | POA: Diagnosis not present

## 2020-12-17 DIAGNOSIS — E1159 Type 2 diabetes mellitus with other circulatory complications: Secondary | ICD-10-CM

## 2020-12-21 ENCOUNTER — Other Ambulatory Visit: Payer: Self-pay | Admitting: Family Medicine

## 2020-12-21 DIAGNOSIS — E119 Type 2 diabetes mellitus without complications: Secondary | ICD-10-CM

## 2020-12-21 NOTE — Telephone Encounter (Signed)
last RF 09/13/20 #100  5 RF- requested too soon Requested Prescriptions  Refused Prescriptions Disp Refills  . ONETOUCH ULTRA test strip [employer] Med Name: ONE TOUCH ULTRA BLUE TESTST(NEW)100] 100 strip     Sig: TEST ONCE DAILY     Endocrinology: Diabetes - Testing Supplies Passed - 12/21/2020  3:59 PM      Passed - Valid encounter within last 12 months    Recent Outpatient Visits          1 month ago Benign essential HTN   [facility] [place]., MD   3 months ago Welcome to [employer] preventive visit   [facility] [place], Leonette Monarch., MD   10 months ago Type 2 diabetes mellitus with other circulatory complication, without long-term current use of insulin [facility])   [facility] [place]., MD   1 year ago Type 2 diabetes mellitus with other circulatory complication, without long-term current use of insulin [facility])   [facility] [place]., MD   1 year ago Annual physical exam   [facility] [place]., MD      Future Appointments            In 2 months Deirdre Evener, MD [facility]   In 3 months [place]., MD [facility]

## 2020-12-23 ENCOUNTER — Other Ambulatory Visit: Payer: Self-pay | Admitting: Family Medicine

## 2020-12-23 DIAGNOSIS — E119 Type 2 diabetes mellitus without complications: Secondary | ICD-10-CM

## 2020-12-25 ENCOUNTER — Other Ambulatory Visit: Payer: Self-pay | Admitting: *Deleted

## 2020-12-25 ENCOUNTER — Other Ambulatory Visit: Payer: Self-pay | Admitting: Family Medicine

## 2020-12-25 DIAGNOSIS — E119 Type 2 diabetes mellitus without complications: Secondary | ICD-10-CM

## 2020-12-25 MED ORDER — TRESIBA FLEXTOUCH 100 UNIT/ML ~~LOC~~ SOPN: PEN_INJECTOR | SUBCUTANEOUS | 1 refills | Status: DC

## 2020-12-25 NOTE — Telephone Encounter (Signed)
Attempted to pharmacy again- extended wait time message- rx resent to pharmacy- unable to verify received and patient needs it. Requested Prescriptions  Pending Prescriptions Disp Refills   insulin degludec (TRESIBA FLEXTOUCH) 100 UNIT/ML FlexTouch Pen 30 mL 1    Sig: ADMINISTER UP TO 30 UNITS UNDER THE SKIN DAILY AS DIRECTED Strength: 100 UNIT/ML     Endocrinology:  Diabetes - Insulins Passed - 12/25/2020 10:48 AM      Passed - HBA1C is between 0 and 7.9 and within 180 days    Hgb A1c MFr Bld  Date Value Ref Range Status  10/30/2020 6.6 (H) 4.8 - 5.6 % Final    Comment:             Prediabetes: 5.7 - 6.4          Diabetes: >6.4          Glycemic control for adults with diabetes: <7.0          Passed - Valid encounter within last 6 months    Recent Outpatient Visits          1 month ago Benign essential HTN   [facility] [place]., [place]   3 months ago Welcome to [employer] preventive visit   [facility] [place], Leonette Monarch., MD   10 months ago Type 2 diabetes mellitus with other circulatory complication, without long-term current use of insulin [facility])   [facility] [place]., MD   1 year ago Type 2 diabetes mellitus with other circulatory complication, without long-term current use of insulin [facility])   [facility] [place]., MD   1 year ago Annual physical exam   [facility] [place]., [facility]      Future Appointments            In 2 months Deirdre Evener, MD [facility]   In 3 months [place]., MD [facility]

## 2020-12-25 NOTE — Progress Notes (Signed)
Patient needs Rx sent to pharmacy- it is marked as print and unable to change the Rx- 2 attempts

## 2020-12-25 NOTE — Telephone Encounter (Signed)
Attempted to verify Rx at pharmacy- extended hold- more than 3 callers ahead- will call back later.

## 2020-12-25 NOTE — Telephone Encounter (Signed)
Medication Refill - Medication:  TRESIBA FLEXTOUCH 100 UNIT/ML FlexTouch Pen  Has the patient contacted their pharmacy? Yes.   (Agent: If no, request that the patient contact the pharmacy for the refill. If patient does not wish to contact the pharmacy document the reason why and proceed with request.) (Agent: If yes, when and what did the pharmacy advise?)  Preferred Pharmacy (with phone number or street name):  [id number] Cheree Ditto, [place] - [address] AT [facility] [address] [place]  [address] [place], [place] [address]  Phone:  [phone]  Fax:  [phone]   Has the patient been seen for an appointment in the last year OR does the patient have an upcoming appointment? Yes.    Agent: Please be advised that RX refills may take up to 3 business days. We ask that you follow-up with your pharmacy.

## 2020-12-26 ENCOUNTER — Other Ambulatory Visit: Payer: Self-pay | Admitting: *Deleted

## 2020-12-26 DIAGNOSIS — E119 Type 2 diabetes mellitus without complications: Secondary | ICD-10-CM

## 2020-12-26 MED ORDER — TRESIBA FLEXTOUCH 100 UNIT/ML ~~LOC~~ SOPN: PEN_INJECTOR | SUBCUTANEOUS | 1 refills | Status: DC

## 2020-12-26 NOTE — Progress Notes (Signed)
Faxed rx to pharmacy  

## 2021-01-14 ENCOUNTER — Other Ambulatory Visit: Payer: Self-pay | Admitting: Family Medicine

## 2021-01-14 DIAGNOSIS — M65332 Trigger finger, left middle finger: Secondary | ICD-10-CM | POA: Diagnosis not present

## 2021-01-14 DIAGNOSIS — F320 Major depressive disorder, single episode, mild: Secondary | ICD-10-CM

## 2021-01-14 DIAGNOSIS — M65322 Trigger finger, left index finger: Secondary | ICD-10-CM | POA: Diagnosis not present

## 2021-01-14 NOTE — Telephone Encounter (Signed)
Copied from CRM [id number]. Topic: Quick Communication - Rx Refill/Question >> Jan 14, 2021 11:04 AM Gaetana Michaelis A wrote: Medication: sertraline (ZOLOFT) 50 MG tablet [[id number]] - patient has zero tablets remaining   Has the patient contacted their pharmacy? Yes.  The patient has made previous attempts to contact both the practice and the pharmacy  (Agent: If no, request that the patient contact the pharmacy for the refill. If patient does not wish to contact the pharmacy document the reason why and proceed with request.) (Agent: If yes, when and what did the pharmacy advise?)  Preferred Pharmacy (with phone number or street name): [id number] Cheree Ditto, [place] - [address] AT [facility] OF [address] [facility]  Phone:  [phone] Fax:  [phone]  Has the patient been seen for an appointment in the last year OR does the patient have an upcoming appointment? Yes.    Agent: Please be advised that RX refills may take up to 3 business days. We ask that you follow-up with your pharmacy.

## 2021-01-15 ENCOUNTER — Other Ambulatory Visit: Payer: Self-pay | Admitting: Family Medicine

## 2021-01-15 DIAGNOSIS — E1159 Type 2 diabetes mellitus with other circulatory complications: Secondary | ICD-10-CM

## 2021-01-15 NOTE — Telephone Encounter (Signed)
Reordered by provider in another encounter.

## 2021-01-15 NOTE — Telephone Encounter (Signed)
Called pharmacy to check on status of refill requested. Unable to wait for confirmation, long wait time .

## 2021-01-15 NOTE — Telephone Encounter (Signed)
Requested Prescriptions  Pending Prescriptions Disp Refills   TRULICITY 0.75 MG/0.5ML SOPN [Pharmacy Med Name: TRULICITY 0.75MG /0.5ML SDP 0.5ML] 2 mL 1    Sig: ADMINISTER 0.75 MG UNDER THE SKIN 1 TIME A WEEK     Endocrinology:  Diabetes - GLP-1 Receptor Agonists Passed - 01/15/2021  8:09 AM      Passed - HBA1C is between 0 and 7.9 and within 180 days    Hgb A1c MFr Bld  Date Value Ref Range Status  10/30/2020 6.6 (H) 4.8 - 5.6 % Final    Comment:             Prediabetes: 5.7 - 6.4          Diabetes: >6.4          Glycemic control for adults with diabetes: <7.0          Passed - Valid encounter within last 6 months    Recent Outpatient Visits          2 months ago Benign essential HTN   [facility] [place]., [place]   4 months ago Welcome to [employer] preventive visit   [facility] [place], Leonette Monarch., MD   10 months ago Type 2 diabetes mellitus with other circulatory complication, without long-term current use of insulin [facility])   [facility] [place]., MD   1 year ago Type 2 diabetes mellitus with other circulatory complication, without long-term current use of insulin [facility])   [facility] [place]., MD   1 year ago Annual physical exam   [facility] [place]., MD      Future Appointments            In 2 months [place]., MD [facility]

## 2021-01-30 DIAGNOSIS — E782 Mixed hyperlipidemia: Secondary | ICD-10-CM | POA: Diagnosis not present

## 2021-01-30 DIAGNOSIS — I25118 Atherosclerotic heart disease of native coronary artery with other forms of angina pectoris: Secondary | ICD-10-CM | POA: Diagnosis not present

## 2021-01-30 DIAGNOSIS — G4733 Obstructive sleep apnea (adult) (pediatric): Secondary | ICD-10-CM | POA: Diagnosis not present

## 2021-01-30 DIAGNOSIS — I10 Essential (primary) hypertension: Secondary | ICD-10-CM | POA: Diagnosis not present

## 2021-02-01 ENCOUNTER — Other Ambulatory Visit: Payer: Self-pay | Admitting: Family Medicine

## 2021-02-01 DIAGNOSIS — I10 Essential (primary) hypertension: Secondary | ICD-10-CM

## 2021-02-01 NOTE — Telephone Encounter (Signed)
Requested Prescriptions  Pending Prescriptions Disp Refills   amLODipine (NORVASC) 5 MG tablet [Pharmacy Med Name: AMLODIPINE BESYLATE 5MG  TABLETS] 90 tablet 2    Sig: TAKE 1 TABLET(5 MG) BY MOUTH DAILY     Cardiovascular:  Calcium Channel Blockers Passed - 02/01/2021  2:47 PM      Passed - Last BP in normal range    BP Readings from Last 1 Encounters:  10/30/20 116/81         Passed - Valid encounter within last 6 months    Recent Outpatient Visits          3 months ago Benign essential HTN   [facility] MD   5 months ago Welcome to [place] preventive visit   [facility]., MD   11 months ago Type 2 diabetes mellitus with other circulatory complication, without long-term current use of insulin [facility])   [facility]., MD   1 year ago Type 2 diabetes mellitus with other circulatory complication, without long-term current use of insulin [facility])   [facility]., MD   1 year ago Annual physical exam   [facility] MD      Future Appointments            In 2 months [place]., MD [facility]

## 2021-02-11 ENCOUNTER — Other Ambulatory Visit: Payer: Self-pay | Admitting: Family Medicine

## 2021-02-11 DIAGNOSIS — E119 Type 2 diabetes mellitus without complications: Secondary | ICD-10-CM

## 2021-02-20 DIAGNOSIS — I25118 Atherosclerotic heart disease of native coronary artery with other forms of angina pectoris: Secondary | ICD-10-CM | POA: Diagnosis not present

## 2021-02-20 DIAGNOSIS — I6523 Occlusion and stenosis of bilateral carotid arteries: Secondary | ICD-10-CM | POA: Diagnosis not present

## 2021-02-27 DIAGNOSIS — I10 Essential (primary) hypertension: Secondary | ICD-10-CM | POA: Diagnosis not present

## 2021-02-27 DIAGNOSIS — I350 Nonrheumatic aortic (valve) stenosis: Secondary | ICD-10-CM | POA: Diagnosis not present

## 2021-02-27 DIAGNOSIS — E782 Mixed hyperlipidemia: Secondary | ICD-10-CM | POA: Diagnosis not present

## 2021-02-27 DIAGNOSIS — I6523 Occlusion and stenosis of bilateral carotid arteries: Secondary | ICD-10-CM | POA: Insufficient documentation

## 2021-02-27 DIAGNOSIS — I25118 Atherosclerotic heart disease of native coronary artery with other forms of angina pectoris: Secondary | ICD-10-CM | POA: Diagnosis not present

## 2021-02-27 DIAGNOSIS — Z794 Long term (current) use of insulin: Secondary | ICD-10-CM | POA: Diagnosis not present

## 2021-02-27 DIAGNOSIS — E119 Type 2 diabetes mellitus without complications: Secondary | ICD-10-CM | POA: Diagnosis not present

## 2021-03-01 ENCOUNTER — Other Ambulatory Visit: Payer: Self-pay | Admitting: Family Medicine

## 2021-03-01 DIAGNOSIS — E782 Mixed hyperlipidemia: Secondary | ICD-10-CM

## 2021-03-04 ENCOUNTER — Other Ambulatory Visit: Payer: Self-pay | Admitting: Family Medicine

## 2021-03-04 DIAGNOSIS — E119 Type 2 diabetes mellitus without complications: Secondary | ICD-10-CM

## 2021-03-10 ENCOUNTER — Ambulatory Visit: Payer: Self-pay | Admitting: Dermatology

## 2021-03-22 ENCOUNTER — Other Ambulatory Visit: Payer: Self-pay | Admitting: Family Medicine

## 2021-03-24 NOTE — Telephone Encounter (Signed)
Requested Prescriptions  ?Pending Prescriptions Disp Refills  ?? lisinopril (ZESTRIL) 20 MG tablet [Pharmacy Med Name: LISINOPRIL 20MG  TABLETS] 90 tablet 0  ?  Sig: TAKE 1 TABLET(20 MG) BY MOUTH DAILY  ?  ? Cardiovascular:  ACE Inhibitors Failed - 03/22/2021 10:48 AM  ?  ?  Failed - K in normal range and within 180 days  ?  Potassium  ?Date Value Ref Range Status  ?10/30/2020 5.3 (H) 3.5 - 5.2 mmol/L Final  ?   ?  ?  Passed - Cr in normal range and within 180 days  ?  Creat  ?Date Value Ref Range Status  ?10/07/2016 1.11 0.70 - 1.25 mg/dL Final  ?  Comment:  ?  For patients >26 years of age, the reference limit ?for Creatinine is approximately 13% higher for people ?identified as African-American. ?. ?  ? ?Creatinine, Ser  ?Date Value Ref Range Status  ?10/30/2020 1.10 0.76 - 1.27 mg/dL Final  ?   ?  ?  Passed - Patient is not pregnant  ?  ?  Passed - Last BP in normal range  ?  BP Readings from Last 1 Encounters:  ?10/30/20 116/81  ?   ?  ?  Passed - Valid encounter within last 6 months  ?  Recent Outpatient Visits   ?      ? 4 months ago Benign essential HTN  ? [facility] [place]  ? 6 months ago Welcome to [facility] preventive visit  ? [facility] MD  ? 1 year ago Type 2 diabetes mellitus with other circulatory complication, without long-term current use of insulin (HCC)  ? [facility] MD  ? 1 year ago Type 2 diabetes mellitus with other circulatory complication, without long-term current use of insulin (HCC)  ? [facility] MD  ? 1 year ago Annual physical exam  ? [facility] MD  ?  ?  ?Future Appointments   ?        ? In 2 weeks Maple Hudson., MD [facility]  ?  ? ?  ?  ?  ? ?

## 2021-04-01 ENCOUNTER — Other Ambulatory Visit: Payer: Self-pay | Admitting: Family Medicine

## 2021-04-01 DIAGNOSIS — E119 Type 2 diabetes mellitus without complications: Secondary | ICD-10-CM

## 2021-04-07 DIAGNOSIS — Z96611 Presence of right artificial shoulder joint: Secondary | ICD-10-CM | POA: Diagnosis not present

## 2021-04-07 DIAGNOSIS — Z471 Aftercare following joint replacement surgery: Secondary | ICD-10-CM | POA: Diagnosis not present

## 2021-04-08 ENCOUNTER — Other Ambulatory Visit: Payer: Self-pay | Admitting: Family Medicine

## 2021-04-08 DIAGNOSIS — E1159 Type 2 diabetes mellitus with other circulatory complications: Secondary | ICD-10-CM

## 2021-04-08 NOTE — Progress Notes (Signed)
?  ? ? ?Established patient visit ? ?I,[name],acting as a scribe for [name], MD.,have documented all relevant documentation on the behalf of [name], MD,as directed by  [name], MD while in the presence of [name], MD. ? ? ?Patient: Samuel Gentry   DOB: 1955-08-31   66 y.o. Male  MRN: [id number] ?Visit Date: 04/09/2021 ? ?Today's healthcare provider: [name], MD  ? ?Chief Complaint  ?Patient presents with  ? Follow-up  ? Diabetes  ? Hypertension  ? ?Subjective  ?  ?HPI  ?Patient comes in today for follow-up.  He feels well and has no complaints.  He has no hypoglycemia that he knows of. ?He has a laceration on his right hand that does not need any attention. ?Diabetes Mellitus Type II, follow-up ? ?Lab Results  ?Component Value Date  ? HGBA1C 6.2 (A) 04/09/2021  ? HGBA1C 6.6 (H) 10/30/2020  ? HGBA1C 7.1 (H) 04/25/2020  ? ?Last seen for diabetes 5 months ago.  ?Management since then includes; Controlled on illicitly 7.62 metformin 1000 mg twice a day proceed with 30 units daily. ?He reports good compliance with treatment. ?He is not having side effects. none ? ?Home blood sugar records: fasting range: 114/120 ? ?Episodes of hypoglycemia? No none ?  ?Current insulin regiment: Trulicity ?Most Recent Eye Exam: 03/26/2020 ? ?--------------------------------------------------------------------------------------------------- ?Hypertension, follow-up ? ?BP Readings from Last 3 Encounters:  ?04/09/21 115/75  ?10/30/20 116/81  ?08/29/20 121/74  ? Wt Readings from Last 3 Encounters:  ?04/09/21 195 lb (88.5 kg)  ?10/30/20 196 lb (88.9 kg)  ?08/29/20 193 lb (87.5 kg)  ?  ? ?He was last seen for hypertension 5 months ago.  ?BP at that visit was 116/81. ?Management since that visit include; Controlled on amlodipine 5 mg and lisinopril 20 mg and spironolactone 25 mg. ?He reports good compliance with treatment. ?He is not having side effects. none ?He is exercising. ?He  is adherent to low salt diet.   ?Outside blood pressures are not checking. ? ?He does not smoke. ? ?Use of agents associated with hypertension: none.  ? ?--------------------------------------------------------------------------------------------------- ? ? ?Medications: ?Outpatient Medications Prior to Visit  ?Medication Sig  ? amLODipine (NORVASC) 5 MG tablet TAKE 1 TABLET(5 MG) BY MOUTH DAILY  ? aspirin 325 MG tablet Take 325 mg by mouth daily.   ? BD PEN NEEDLE NANO 2ND GEN 32G X 4 MM MISC USE AS DIRECTED WITH TRESIBA EVERY DAY  ? blood glucose meter kit and supplies Dispense based on patient and insurance preference. Use up to once daily as directed. (FOR ICD-10 E11.59).  ? insulin degludec (TRESIBA FLEXTOUCH) 100 UNIT/ML FlexTouch Pen ADMINISTER UP TO 30 UNITS UNDER THE SKIN DAILY AS DIRECTED ?Strength: 100 UNIT/ML  ? Lancets (ONETOUCH DELICA PLUS [id number]) MISC TEST ONCE DAILY  ? lisinopril (ZESTRIL) 20 MG tablet TAKE 1 TABLET(20 MG) BY MOUTH DAILY  ? metFORMIN (GLUCOPHAGE) 1000 MG tablet TAKE 1 TABLET BY MOUTH TWICE DAILY WITH MEALS  ? MULTIPLE VITAMINS PO Take 1 tablet by mouth daily.  ? omeprazole (PRILOSEC) 20 MG capsule Take 1 capsule (20 mg total) by mouth 2 (two) times daily before a meal. (Patient taking differently: Take 20 mg by mouth daily.)  ? ONETOUCH ULTRA test strip TEST ONCE DAILY  ? rosuvastatin (CRESTOR) 40 MG tablet TAKE 1 TABLET(40 MG) BY MOUTH DAILY  ? sertraline (ZOLOFT) 50 MG tablet TAKE 1 TABLET(50 MG) BY MOUTH DAILY  ? TRULICITY 2.56 LS/9.3TD SOPN ADMINISTER  0.75 MG UNDER THE SKIN 1 TIME A WEEK  ? TRULICITY 1.5 MA/2.6JF SOPN INJECT 1.5 MG( 0.5 ML) UNDER THE SKIN ONCE A WEEK  ? [DISCONTINUED] amoxicillin-clavulanate (AUGMENTIN) 875-125 MG tablet Take 1 tablet by mouth 2 (two) times daily. (Patient not taking: Reported on 04/09/2021)  ? [DISCONTINUED] oxyCODONE-acetaminophen (PERCOCET) 5-325 MG tablet Take 1-2 tablets every 4 hours as needed for post operative pain. MAX 6/day (Patient  not taking: Reported on 04/09/2021)  ? [DISCONTINUED] spironolactone (ALDACTONE) 25 MG tablet TAKE 1 TABLET(25 MG) BY MOUTH DAILY (Patient not taking: Reported on 04/09/2021)  ? [DISCONTINUED] tadalafil (CIALIS) 20 MG tablet Take 1 tablet (20 mg total) by mouth every three (3) days as needed for erectile dysfunction. (Patient not taking: Reported on 04/09/2021)  ? [DISCONTINUED] tiZANidine (ZANAFLEX) 4 MG tablet Take 1 tablet (4 mg total) by mouth every 8 (eight) hours as needed for muscle spasms. (Patient not taking: Reported on 04/09/2021)  ? ?No facility-administered medications prior to visit.  ? ? ?Review of Systems  ?Constitutional:  Negative for appetite change, chills and fever.  ?Respiratory:  Negative for chest tightness, shortness of breath and wheezing.   ?Cardiovascular:  Negative for chest pain and palpitations.  ?Gastrointestinal:  Negative for abdominal pain, nausea and vomiting.  ? ?Last hemoglobin A1c ?Lab Results  ?Component Value Date  ? HGBA1C 6.2 (A) 04/09/2021  ? ?  ?  Objective  ?  ?BP 115/75 (BP Location: Left Arm, Patient Position: Sitting, Cuff Size: Large)   Pulse 70   Temp 98.3 ?F (36.8 ?C) (Temporal)   Resp 16   Ht $R'5\' 10"'od$  (1.778 m)   Wt 195 lb (88.5 kg)   SpO2 98%   BMI 27.98 kg/m?  ?BP Readings from Last 3 Encounters:  ?04/09/21 115/75  ?10/30/20 116/81  ?08/29/20 121/74  ? ?Wt Readings from Last 3 Encounters:  ?04/09/21 195 lb (88.5 kg)  ?10/30/20 196 lb (88.9 kg)  ?08/29/20 193 lb (87.5 kg)  ? ?  ? ?Physical Exam ?Vitals reviewed.  ?Constitutional:   ?   Appearance: He is well-developed.  ?HENT:  ?   Head: Normocephalic and atraumatic.  ?   Right Ear: External ear normal.  ?   Left Ear: External ear normal.  ?Eyes:  ?   General: No scleral icterus. ?   Conjunctiva/sclera: Conjunctivae normal.  ?Neck:  ?   Thyroid: No thyromegaly.  ?Cardiovascular:  ?   Rate and Rhythm: Normal rate and regular rhythm.  ?   Heart sounds: Normal heart sounds.  ?Pulmonary:  ?   Effort: Pulmonary  effort is normal.  ?   Breath sounds: Normal breath sounds.  ?Abdominal:  ?   Palpations: Abdomen is soft.  ?Skin: ?   General: Skin is warm and dry.  ?Neurological:  ?   General: No focal deficit present.  ?   Mental Status: He is alert and oriented to person, place, and time.  ?Psychiatric:     ?   Mood and Affect: Mood normal.     ?   Behavior: Behavior normal.     ?   Thought Content: Thought content normal.     ?   Judgment: Judgment normal.  ?  ? ? ?Results for orders placed or performed in visit on 04/09/21  ?POCT glycosylated hemoglobin (Hb A1C)  ?Result Value Ref Range  ? Hemoglobin A1C 6.2 (A) 4.0 - 5.6 %  ? Est. average glucose Bld gHb Est-mCnc 131   ? ? Assessment & Plan  ?  ? ?  1. Type 2 diabetes mellitus without complication, without long-term current use of insulin ([place]) ?A1c is gone from 6.6-6.2 and patient has no hypoglycemia but he is on insulin.  We will get him a continuous glucose monitor and decrease insulin from 40 to 35 units daily to try to avoid hypoglycemia.  Patient on Trulicity and metformin.  Consider adding Jardiance in this patient with heart disease ?- POCT glycosylated hemoglobin (Hb A1C)--6.2 today. ? ?2. Benign essential HTN ?Excellent control ? ?3. Laceration of skin of hand, unspecified laterality, initial encounter ?Update Tdap. ?- Tdap vaccine greater than or equal to 7yo IM ? ? ?4. Type 2 diabetes mellitus without complication, with long-term current use of insulin ([place]) ?As above. ?- Continuous Blood Gluc Receiver ([place]) DEVI; 1 each by Does not apply route once for 1 dose. DX Code: E11.59  Dispense: 1 each; Refill: EACH ?- Continuous Blood Gluc Sensor (DEXCOM G6 SENSOR) MISC; 1 each by Does not apply route once for 1 dose. DX Code: E11.59  Dispense: 3 each; Refill: 12 ?- Continuous Blood Gluc Transmit (DEXCOM G6 TRANSMITTER) MISC; 1 each by Does not apply route once for 1 dose. DX Code: E11.59  Dispense: 1 each; Refill: 0 ? ?5. CAD in native artery ?All risk  factors treated ? ?6. Sensorineural hearing loss (SNHL) of both ears ? ? ?7. Chronic pain of both shoulders ? ? ?8. OSA (obstructive sleep apnea) ?On CPAP ? ?9. Other hyperlipidemia ?On rosuvastatin 40 ? ? ? ?No fol

## 2021-04-09 ENCOUNTER — Ambulatory Visit (INDEPENDENT_AMBULATORY_CARE_PROVIDER_SITE_OTHER): Payer: HMO | Admitting: Family Medicine

## 2021-04-09 ENCOUNTER — Encounter: Payer: Self-pay | Admitting: Family Medicine

## 2021-04-09 ENCOUNTER — Other Ambulatory Visit: Payer: Self-pay

## 2021-04-09 VITALS — BP 115/75 | HR 70 | Temp 98.3°F | Resp 16 | Ht 70.0 in | Wt 195.0 lb

## 2021-04-09 DIAGNOSIS — E119 Type 2 diabetes mellitus without complications: Secondary | ICD-10-CM

## 2021-04-09 DIAGNOSIS — M25512 Pain in left shoulder: Secondary | ICD-10-CM

## 2021-04-09 DIAGNOSIS — M25511 Pain in right shoulder: Secondary | ICD-10-CM

## 2021-04-09 DIAGNOSIS — I2510 Atherosclerotic heart disease of native coronary artery without angina pectoris: Secondary | ICD-10-CM

## 2021-04-09 DIAGNOSIS — E7849 Other hyperlipidemia: Secondary | ICD-10-CM | POA: Diagnosis not present

## 2021-04-09 DIAGNOSIS — S61419A Laceration without foreign body of unspecified hand, initial encounter: Secondary | ICD-10-CM | POA: Diagnosis not present

## 2021-04-09 DIAGNOSIS — Z23 Encounter for immunization: Secondary | ICD-10-CM | POA: Diagnosis not present

## 2021-04-09 DIAGNOSIS — G8929 Other chronic pain: Secondary | ICD-10-CM | POA: Diagnosis not present

## 2021-04-09 DIAGNOSIS — I10 Essential (primary) hypertension: Secondary | ICD-10-CM | POA: Diagnosis not present

## 2021-04-09 DIAGNOSIS — G4733 Obstructive sleep apnea (adult) (pediatric): Secondary | ICD-10-CM

## 2021-04-09 DIAGNOSIS — Z794 Long term (current) use of insulin: Secondary | ICD-10-CM | POA: Diagnosis not present

## 2021-04-09 DIAGNOSIS — H903 Sensorineural hearing loss, bilateral: Secondary | ICD-10-CM | POA: Diagnosis not present

## 2021-04-09 LAB — POCT GLYCOSYLATED HEMOGLOBIN (HGB A1C)
Est. average glucose Bld gHb Est-mCnc: 131
Hemoglobin A1C: 6.2 % — AB (ref 4.0–5.6)

## 2021-04-09 MED ORDER — DEXCOM G6 RECEIVER DEVI: 1.0000 | Freq: Once | Status: DC

## 2021-04-09 MED ORDER — DEXCOM G6 SENSOR MISC: 1.0000 | Freq: Once | 12 refills | Status: DC

## 2021-04-09 MED ORDER — DEXCOM G6 TRANSMITTER MISC: 1.0000 | Freq: Once | 0 refills | Status: DC

## 2021-04-09 NOTE — Patient Instructions (Signed)
DECREASE INSULIN FROM 40 UNITS TO 35 UNITS. ?

## 2021-04-10 ENCOUNTER — Telehealth: Payer: Self-pay

## 2021-04-10 NOTE — Telephone Encounter (Signed)
Received and faxed back.

## 2021-04-10 NOTE — Telephone Encounter (Signed)
Have not received any faxes yet. This was just ordered yesterday. PA was started. ?

## 2021-04-10 NOTE — Telephone Encounter (Signed)
Copied from [place]. Topic: General - Other ?>> Apr 10, 2021 12:36 PM Loma Boston wrote: ?Caller, [employer], [place], states thaey have sent 2 request in re to pt starting Dexcom G6, Receiver, Transmitter. They have forwarded another fax today for a request of further info. Please send back requested info or if ? Please contact them at 1 [phone] ?

## 2021-04-14 DIAGNOSIS — H5203 Hypermetropia, bilateral: Secondary | ICD-10-CM | POA: Diagnosis not present

## 2021-04-14 DIAGNOSIS — E1339 Other specified diabetes mellitus with other diabetic ophthalmic complication: Secondary | ICD-10-CM | POA: Diagnosis not present

## 2021-04-14 LAB — HM DIABETES EYE EXAM

## 2021-04-15 NOTE — Telephone Encounter (Signed)
Tiajuana Amass, case manager from [employer] called to report that the Dexcom is not covered however the [facility] is covered. Please advise, is there a specific reason that the Dexcom was selected?  ? ?Best contact: [phone] ? ?Please call back ?

## 2021-04-16 ENCOUNTER — Other Ambulatory Visit: Payer: Self-pay | Admitting: *Deleted

## 2021-04-16 ENCOUNTER — Telehealth: Payer: Self-pay

## 2021-04-16 ENCOUNTER — Other Ambulatory Visit: Payer: Self-pay | Admitting: Physician Assistant

## 2021-04-16 DIAGNOSIS — E119 Type 2 diabetes mellitus without complications: Secondary | ICD-10-CM

## 2021-04-16 MED ORDER — FREESTYLE LIBRE 2 READER DEVI: 0 refills | Status: AC

## 2021-04-16 MED ORDER — FREESTYLE LIBRE SENSOR SYSTEM MISC: 1.0000 | Freq: Once | 0 refills | Status: DC

## 2021-04-16 MED ORDER — FREESTYLE LIBRE READER DEVI: 1.0000 | Freq: Once | 0 refills | Status: DC

## 2021-04-16 NOTE — Telephone Encounter (Signed)
Copied from CRM [id number]. Topic: Quick Communication - Rx Refill/Question ?>> Apr 16, 2021  1:26 PM Pawlus, Maxine Glenn A wrote: ?Pharmacy calling to follow up on an rx sent over Continuous Blood Gluc Receiver (FREESTYLE LIBRE READER) DEVI [[id number]].  ?Pharmacist stated this is no longer made and wanted to know if she could go ahead with the [facility] 2 instead, please advise. ?

## 2021-04-16 NOTE — Telephone Encounter (Signed)
Sent new rx for [employer]. Patient did not care which sensor he received, as long as insurance covered it. ?

## 2021-04-18 ENCOUNTER — Telehealth: Payer: Self-pay

## 2021-04-18 NOTE — Telephone Encounter (Signed)
Copied from CRM [id number]. Topic: General - Other ?>> Apr 18, 2021 10:11 AM Marylen Ponto wrote: ?Reason for CRM: Lupita Leash with [employer] reports that the [facility] 2 is covered by patient's insurance. Cb# [phone] ?

## 2021-04-18 NOTE — Telephone Encounter (Signed)
Freestyle Libre 2 rx was sent to patient's pharmacy 04/16/2021. ?

## 2021-04-28 ENCOUNTER — Other Ambulatory Visit: Payer: Self-pay | Admitting: Family Medicine

## 2021-04-28 DIAGNOSIS — I10 Essential (primary) hypertension: Secondary | ICD-10-CM

## 2021-04-28 NOTE — Telephone Encounter (Signed)
Requested Prescriptions  ?Pending Prescriptions Disp Refills  ?? amLODipine (NORVASC) 5 MG tablet [Pharmacy Med Name: AMLODIPINE BESYLATE 5MG  TABLETS] 90 tablet 1  ?  Sig: TAKE 1 TABLET(5 MG) BY MOUTH DAILY  ?  ? Cardiovascular: Calcium Channel Blockers 2 Passed - 04/28/2021 10:53 AM  ?  ?  Passed - Last BP in normal range  ?  BP Readings from Last 1 Encounters:  ?04/09/21 115/75  ?   ?  ?  Passed - Last Heart Rate in normal range  ?  Pulse Readings from Last 1 Encounters:  ?04/09/21 70  ?   ?  ?  Passed - Valid encounter within last 6 months  ?  Recent Outpatient Visits   ?      ? 2 weeks ago Type 2 diabetes mellitus without complication, without long-term current use of insulin (HCC)  ? [facility] [place]  ? 6 months ago Benign essential HTN  ? [facility] MD  ? 8 months ago Welcome to [facility] preventive visit  ? [facility] MD  ? 1 year ago Type 2 diabetes mellitus with other circulatory complication, without long-term current use of insulin (HCC)  ? [facility] MD  ? 1 year ago Type 2 diabetes mellitus with other circulatory complication, without long-term current use of insulin (HCC)  ? [facility] MD  ?  ?  ?Future Appointments   ?        ? In 5 months Maple Hudson., MD [facility]  ?  ? ?  ?  ?  ? ? ?

## 2021-05-05 ENCOUNTER — Ambulatory Visit: Payer: Self-pay | Admitting: *Deleted

## 2021-05-05 NOTE — Telephone Encounter (Signed)
Reason for Disposition ? [1] Caller has URGENT medicine question about med that PCP or specialist prescribed AND [2] triager unable to answer question ? ?Answer Assessment - Initial Assessment Questions ?1. NAME of MEDICATION: "What medicine are you calling about?" ?    Trulicity 0.75 mg ?2. QUESTION: "What is your question?" (e.g., double dose of medicine, side effect) ?    Which dose should I be taking?   I have 2 prescriptions one for 1.5 mg and one for 0.75 mg.   I'm taking the 0.75 mg which is what I think I'm supposed to be on.   I'm not aware of it changing.  The pharmacist on Sat. Asked me if my prescription had changed.    "I still have 5 weeks left on my 0.75 mg rx. ?3. PRESCRIBING HCP: "Who prescribed it?" Reason: if prescribed by specialist, call should be referred to that group. ?    Dr. Sullivan Lone ?4. SYMPTOMS: "Do you have any symptoms?" ?    No ?5. SEVERITY: If symptoms are present, ask "Are they mild, moderate or severe?" ?    N/A ?6. PREGNANCY:  "Is there any chance that you are pregnant?" "When was your last menstrual period?" ?    N/A ? ?Protocols used: Medication Question Call-A-AH ? ?

## 2021-05-05 NOTE — Telephone Encounter (Signed)
?  Chief Complaint: Trulicity dosage clarification.  Should he be taking 0.75 mg or 1.5 mg?   He is taking 0.75 mg and has 5 weeks left on his rx.     ?Symptoms: N/A ?Frequency: N/A ?Pertinent Negatives: Patient denies knowing anything about a dose change to 1.5 mg. ?Disposition: [] ED /[] Urgent Care (no appt availability in office) / [] Appointment(In office/virtual)/ []  [place] [facility]/ [] Home Care/ [] Refused Recommended Disposition /[] [place] [facility]/ [x]  Follow-up with PCP ?Additional Notes: Message sent to Dr. for clarification.   Pt can be reached at [phone] ok to leave a detailed voicemail.    ?

## 2021-05-06 ENCOUNTER — Other Ambulatory Visit: Payer: Self-pay

## 2021-05-06 NOTE — Telephone Encounter (Signed)
Advised 

## 2021-05-09 ENCOUNTER — Other Ambulatory Visit: Payer: Self-pay | Admitting: Family Medicine

## 2021-05-09 DIAGNOSIS — E119 Type 2 diabetes mellitus without complications: Secondary | ICD-10-CM

## 2021-05-09 MED ORDER — FREESTYLE LIBRE SENSOR SYSTEM MISC: 1.0000 | Freq: Once | 0 refills | Status: DC

## 2021-05-09 NOTE — Telephone Encounter (Signed)
Requested Prescriptions  ?Pending Prescriptions Disp Refills  ?? Continuous Blood Gluc Sensor (FREESTYLE LIBRE SENSOR SYSTEM) MISC 1 each 0  ?  Sig: 1 each by Does not apply route once for 1 dose. DX Code: E11.59  ?  ? Endocrinology: Diabetes - Testing Supplies Passed - 05/09/2021 12:56 PM  ?  ?  Passed - Valid encounter within last 12 months  ?  Recent Outpatient Visits   ?      ? 1 month ago Type 2 diabetes mellitus without complication, without long-term current use of insulin (HCC)  ? [facility]., MD  ? 6 months ago Benign essential HTN  ? [facility]., MD  ? 8 months ago Welcome to [employer] preventive visit  ? [facility] Maple Hudson., MD  ? 1 year ago Type 2 diabetes mellitus with other circulatory complication, without long-term current use of insulin (HCC)  ? [facility]., MD  ? 1 year ago Type 2 diabetes mellitus with other circulatory complication, without long-term current use of insulin (HCC)  ? [facility] Hudson., MD  ?  ?  ?Future Appointments   ?        ? In 4 months Maple Hudson., MD [facility], [facility]  ?  ? ?  ?  ?  ? ?

## 2021-05-09 NOTE — Telephone Encounter (Signed)
Samuel Gentry with Pharmacy called in to request pt's Rx for Continuous Blood Gluc Receiver (FREESTYLE LIBRE 2 READER) Censor ?She said that pt just transferred to their pharmacy.  ? ? ?Pharmacy info :  ?[facility] - [place], [place] - [address] Phone:  [phone]  ?Fax:  [phone]  ?  ? ?Future ov: 09/30/21  ?

## 2021-06-17 ENCOUNTER — Other Ambulatory Visit: Payer: Self-pay | Admitting: Family Medicine

## 2021-06-17 DIAGNOSIS — E119 Type 2 diabetes mellitus without complications: Secondary | ICD-10-CM

## 2021-06-18 NOTE — Telephone Encounter (Signed)
Requested medication (s) are due for refill today: yes  Requested medication (s) are on the active medication list: yes  Last refill:  05/09/21  Future visit scheduled: yes  Notes to clinic:  prescription ended 05/09/21   Requested Prescriptions  Pending Prescriptions Disp Refills   Continuous Blood Gluc Sensor (FREESTYLE LIBRE 2 SENSOR) MISC [Pharmacy Med Name: FREESTYLE LIBRE 2 SENSOR] 1 each 0    Sig: USE 1 AS DIRECTED     Endocrinology: Diabetes - Testing Supplies Passed - 06/17/2021  5:12 PM      Passed - Valid encounter within last 12 months    Recent Outpatient Visits           2 months ago Type 2 diabetes mellitus without complication, without long-term current use of insulin [facility])   [facility] Hudson., MD   7 months ago Benign essential HTN   [facility] Maple Hudson., MD   9 months ago Welcome to [employer] preventive visit   [facility] Hudson., MD   1 year ago Type 2 diabetes mellitus with other circulatory complication, without long-term current use of insulin [facility])   [facility] Maple Hudson., MD   1 year ago Type 2 diabetes mellitus with other circulatory complication, without long-term current use of insulin [facility])   [facility] Maple Hudson., MD       Future Appointments             In 3 months Maple Hudson., MD [facility]

## 2021-06-26 ENCOUNTER — Other Ambulatory Visit: Payer: Self-pay

## 2021-06-26 DIAGNOSIS — E119 Type 2 diabetes mellitus without complications: Secondary | ICD-10-CM

## 2021-06-26 MED ORDER — FREESTYLE LIBRE 2 SENSOR MISC: 3 refills | Status: DC

## 2021-06-30 ENCOUNTER — Ambulatory Visit: Payer: Self-pay | Admitting: *Deleted

## 2021-06-30 ENCOUNTER — Other Ambulatory Visit: Payer: Self-pay | Admitting: Family Medicine

## 2021-06-30 ENCOUNTER — Other Ambulatory Visit: Payer: Self-pay

## 2021-06-30 DIAGNOSIS — E119 Type 2 diabetes mellitus without complications: Secondary | ICD-10-CM

## 2021-06-30 MED ORDER — FREESTYLE LIBRE 2 SENSOR MISC: 11 refills | Status: AC

## 2021-06-30 MED ORDER — METFORMIN HCL 1000 MG PO TABS: 1000.0000 mg | ORAL_TABLET | Freq: Two times a day (BID) | ORAL | 2 refills | Status: AC

## 2021-06-30 MED ORDER — LISINOPRIL 20 MG PO TABS: ORAL_TABLET | ORAL | 1 refills | Status: DC

## 2021-06-30 NOTE — Telephone Encounter (Signed)
Requested medication (s) are due for refill today: yes  Requested medication (s) are on the active medication list: yes  Last refill:  03/24/21 #90 0 refills  Future visit scheduled: yes in 3 months  Notes to clinic:  protocol failed last labs 10/30/20 . Do you want to refill Rx?     Requested Prescriptions  Pending Prescriptions Disp Refills   lisinopril (ZESTRIL) 20 MG tablet 90 tablet 0     Cardiovascular:  ACE Inhibitors Failed - 06/30/2021  9:50 AM      Failed - Cr in normal range and within 180 days    Creat  Date Value Ref Range Status  10/07/2016 1.11 0.70 - 1.25 mg/dL Final    Comment:    For patients >44 years of age, the reference limit for Creatinine is approximately 13% higher for people identified as African-American. .    Creatinine, Ser  Date Value Ref Range Status  10/30/2020 1.10 0.76 - 1.27 mg/dL Final         Failed - K in normal range and within 180 days    Potassium  Date Value Ref Range Status  10/30/2020 5.3 (H) 3.5 - 5.2 mmol/L Final         Passed - Patient is not pregnant      Passed - Last BP in normal range    BP Readings from Last 1 Encounters:  04/09/21 115/75         Passed - Valid encounter within last 6 months    Recent Outpatient Visits           2 months ago Type 2 diabetes mellitus without complication, without long-term current use of insulin [facility])   [facility]., MD   8 months ago Benign essential HTN   [facility] Maple Hudson., MD   10 months ago Welcome to [employer] preventive visit   [facility] Maple Hudson., MD   1 year ago Type 2 diabetes mellitus with other circulatory complication, without long-term current use of insulin [facility])   [facility] Maple Hudson., MD   1 year ago Type 2 diabetes mellitus with other circulatory complication, without long-term current use of insulin [facility])   [facility] Maple Hudson., MD       Future Appointments             In 3 months Maple Hudson., MD [facility]

## 2021-06-30 NOTE — Telephone Encounter (Signed)
Medication Refill - Medication: metFORMIN (GLUCOPHAGE) 1000 MG tablet [[id number]]   Has the patient contacted their pharmacy? Yes.   (Agent: If no, request that the patient contact the pharmacy for the refill. If patient does not wish to contact the pharmacy document the reason why and proceed with request.) (Agent: If yes, when and what did the pharmacy advise?)  Preferred Pharmacy (with phone number or street name):  [facility] - [place], [place] - [address]  [address] [place] [address]  Phone: [phone] Fax: [phone]  Hours: Not open 24 hours   Has the patient been seen for an appointment in the last year OR does the patient have an upcoming appointment? Yes.    Agent: Please be advised that RX refills may take up to 3 business days. We ask that you follow-up with your pharmacy.

## 2021-06-30 NOTE — Telephone Encounter (Signed)
Message from Carleene Overlie sent at 06/30/2021  9:19 AM EDT  Summary: free style libre sensor   Patient states he was to be prescribed more than 1 free style libre sensor per elena   Patient inquiring why he is still only being prescribed 1   Please follow up w/ patient           Call History   Type Contact Phone/Fax User  06/30/2021 09:17 AM EDT Phone (Incoming) Samuel Massed "[name]" (Self) [phone] Judie Petit) Donnelly Angelica R   Reason for Disposition . [1] Caller has URGENT medicine question about med that PCP or specialist prescribed AND [2] triager unable to answer question    Regarding Free [employer].  Requesting to receive more than one censor per refill.  Answer Assessment - Initial Assessment Questions 1. NAME of MEDICATION: "What medicine are you calling about?"     Free Style Libre Sensor  2. QUESTION: "What is your question?" (e.g., double dose of medicine, side effect)      I'm only getting one sensor per refill but I would like to get more.   Michelle Nasuti [facility]) said I would get 6 sensors so I would not have to go to the pharmacy so often.  She was going to look into that for me.  I'm following up because as of this morning [facility] had not received anything.  I will need another one by the end of this week.  3. PRESCRIBING HCP: "Who prescribed it?" Reason: if prescribed by specialist, call should be referred to that group.     Dr. Sullivan Lone  4. SYMPTOMS: "Do you have any symptoms?"     No symptoms just don't want to go to the pharmacy so often.  5. SEVERITY: If symptoms are present, ask "Are they mild, moderate or severe?"     N/A 6. PREGNANCY:  "Is there any chance that you are pregnant?" "When was your last menstrual period?"     N/A  Protocols used: Medication Question Call-A-AH

## 2021-06-30 NOTE — Telephone Encounter (Signed)
  Chief Complaint: Requesting more than one Free Style Libre 2 Sensor on refills.  "Samuel Gentry was going to see about receiving 6 sensors per refill"  [facility] as of this morning had not received any information regarding this. Symptoms: N/A Frequency: N/A Pertinent Negatives: Patient denies N/A Disposition: [] ED /[] Urgent Care (no appt availability in office) / [] Appointment(In office/virtual)/ []  [place] [facility]/ [] Home Care/ [] Refused Recommended Disposition /[] [place] [facility]/ [x]  Follow-up with PCP Additional Notes: Message sent to [facility] for [place] and Dr. .  Pt agreeable to someone calling him back.

## 2021-06-30 NOTE — Telephone Encounter (Signed)
Medication Refill - Medication: metFORMIN (GLUCOPHAGE) 1000 MG tablet  Has the patient contacted their pharmacy? Yes.    (Agent: If yes, when and what did the pharmacy advise?) never been filled at location, contact provider  Preferred Pharmacy (with phone number or street name):  [facility] [place], [place] - [address] Phone:  [phone]  Fax:  [phone]      Has the patient been seen for an appointment in the last year OR does the patient have an upcoming appointment? Yes.    Agent: Please be advised that RX refills may take up to 3 business days. We ask that you follow-up with your pharmacy.

## 2021-06-30 NOTE — Telephone Encounter (Signed)
Medication Refill - Medication:  lisinopril (ZESTRIL) 20 MG tablet  Has the patient contacted their pharmacy? Yes.    (Agent: If yes, when and what did the pharmacy advise?) has never been filled at the location, call provider  Preferred Pharmacy (with phone number or street name):  [facility] [place], [place] - [address] Phone:  [phone]  Fax:  [phone]     Has the patient been seen for an appointment in the last year OR does the patient have an upcoming appointment? Yes.    Agent: Please be advised that RX refills may take up to 3 business days. We ask that you follow-up with your pharmacy.

## 2021-06-30 NOTE — Telephone Encounter (Signed)
Requested Prescriptions  Pending Prescriptions Disp Refills  . metFORMIN (GLUCOPHAGE) 1000 MG tablet 180 tablet 3    Sig: Take 1 tablet (1,000 mg total) by mouth 2 (two) times daily with a meal.     Endocrinology:  Diabetes - Biguanides Failed - 06/30/2021 10:48 AM      Failed - B12 Level in normal range and within 720 days    No results found for: "VITAMINB12"       Passed - Cr in normal range and within 360 days    Creat  Date Value Ref Range Status  10/07/2016 1.11 0.70 - 1.25 mg/dL Final    Comment:    For patients >26 years of age, the reference limit for Creatinine is approximately 13% higher for people identified as African-American. .    Creatinine, Ser  Date Value Ref Range Status  10/30/2020 1.10 0.76 - 1.27 mg/dL Final         Passed - HBA1C is between 0 and 7.9 and within 180 days    Hemoglobin A1C  Date Value Ref Range Status  04/09/2021 6.2 (A) 4.0 - 5.6 % Final   Hgb A1c MFr Bld  Date Value Ref Range Status  10/30/2020 6.6 (H) 4.8 - 5.6 % Final    Comment:             Prediabetes: 5.7 - 6.4          Diabetes: >6.4          Glycemic control for adults with diabetes: <7.0          Passed - eGFR in normal range and within 360 days    GFR calc Af Amer  Date Value Ref Range Status  09/21/2019 94 >59 mL/min/1.73 Final    Comment:    **[facility] currently reports eGFR in compliance with the current**   recommendations of the [employer]. [facility] will   update reporting as new guidelines are published from the NKF-ASN   Task force.    GFR, Estimated  Date Value Ref Range Status  04/25/2020 >60 >60 mL/min Final    Comment:    (NOTE) Calculated using the CKD-EPI Creatinine Equation (2021)    eGFR  Date Value Ref Range Status  10/30/2020 74 >59 mL/min/1.73 Final         Passed - Valid encounter within last 6 months    Recent Outpatient Visits          2 months ago Type 2 diabetes mellitus without complication, without long-term  current use of insulin [facility])   [facility] Jerrol Banana., MD   8 months ago Benign essential HTN   [facility] Jerrol Banana., MD   10 months ago Welcome to [employer] preventive visit   [facility] Jerrol Banana., MD   1 year ago Type 2 diabetes mellitus with other circulatory complication, without long-term current use of insulin [facility])   [facility] Jerrol Banana., MD   1 year ago Type 2 diabetes mellitus with other circulatory complication, without long-term current use of insulin [facility])   [facility] Jerrol Banana., MD      Future Appointments            In 3 months Jerrol Banana., MD [facility], [facility]           Passed - CBC within normal limits and completed in the last 12 months  WBC  Date Value Ref Range Status  10/30/2020 5.8 3.4 - 10.8 x10E3/uL Final  04/25/2020 5.7 4.0 - 10.5 K/uL Final   RBC  Date Value Ref Range Status  10/30/2020 4.51 4.14 - 5.80 x10E6/uL Final  04/25/2020 4.49 4.22 - 5.81 MIL/uL Final   Hemoglobin  Date Value Ref Range Status  10/30/2020 13.1 13.0 - 17.7 g/dL Final   Hematocrit  Date Value Ref Range Status  10/30/2020 38.5 37.5 - 51.0 % Final   MCHC  Date Value Ref Range Status  10/30/2020 34.0 31.5 - 35.7 g/dL Final  04/25/2020 33.2 30.0 - 36.0 g/dL Final   [facility]  Date Value Ref Range Status  10/30/2020 29.0 26.6 - 33.0 pg Final  04/25/2020 29.4 26.0 - 34.0 pg Final   MCV  Date Value Ref Range Status  10/30/2020 85 79 - 97 fL Final   No results found for: "PLTCOUNTKUC", "LABPLAT", "POCPLA" RDW  Date Value Ref Range Status  10/30/2020 12.7 11.6 - 15.4 % Final

## 2021-07-01 NOTE — Telephone Encounter (Signed)
Medication was refilled 06/30/21 for 180 and 1 refill. This is a duplicate request, will refuse.  Requested Prescriptions  Pending Prescriptions Disp Refills  . metFORMIN (GLUCOPHAGE) 1000 MG tablet 180 tablet 3    Sig: Take 1 tablet (1,000 mg total) by mouth 2 (two) times daily with a meal.     Endocrinology:  Diabetes - Biguanides Failed - 06/30/2021  5:33 PM      Failed - B12 Level in normal range and within 720 days    No results found for: "VITAMINB12"       Passed - Cr in normal range and within 360 days    Creat  Date Value Ref Range Status  10/07/2016 1.11 0.70 - 1.25 mg/dL Final    Comment:    For patients >71 years of age, the reference limit for Creatinine is approximately 13% higher for people identified as African-American. .    Creatinine, Ser  Date Value Ref Range Status  10/30/2020 1.10 0.76 - 1.27 mg/dL Final         Passed - HBA1C is between 0 and 7.9 and within 180 days    Hemoglobin A1C  Date Value Ref Range Status  04/09/2021 6.2 (A) 4.0 - 5.6 % Final   Hgb A1c MFr Bld  Date Value Ref Range Status  10/30/2020 6.6 (H) 4.8 - 5.6 % Final    Comment:             Prediabetes: 5.7 - 6.4          Diabetes: >6.4          Glycemic control for adults with diabetes: <7.0          Passed - eGFR in normal range and within 360 days    GFR calc Af Amer  Date Value Ref Range Status  09/21/2019 94 >59 mL/min/1.73 Final    Comment:    **[facility] currently reports eGFR in compliance with the current**   recommendations of the [employer]. [facility] will   update reporting as new guidelines are published from the NKF-ASN   Task force.    GFR, Estimated  Date Value Ref Range Status  04/25/2020 >60 >60 mL/min Final    Comment:    (NOTE) Calculated using the CKD-EPI Creatinine Equation (2021)    eGFR  Date Value Ref Range Status  10/30/2020 74 >59 mL/min/1.73 Final         Passed - Valid encounter within last 6 months    Recent Outpatient  Visits          2 months ago Type 2 diabetes mellitus without complication, without long-term current use of insulin [facility])   [facility] Jerrol Banana., MD   8 months ago Benign essential HTN   [facility] Jerrol Banana., MD   10 months ago Welcome to [employer] preventive visit   [facility] Jerrol Banana., MD   1 year ago Type 2 diabetes mellitus with other circulatory complication, without long-term current use of insulin [facility])   [facility] Jerrol Banana., MD   1 year ago Type 2 diabetes mellitus with other circulatory complication, without long-term current use of insulin [employer])   [facility] Jerrol Banana., MD      Future Appointments            In 3 months Jerrol Banana., MD [facility], [facility]  Passed - CBC within normal limits and completed in the last 12 months    WBC  Date Value Ref Range Status  10/30/2020 5.8 3.4 - 10.8 x10E3/uL Final  04/25/2020 5.7 4.0 - 10.5 K/uL Final   RBC  Date Value Ref Range Status  10/30/2020 4.51 4.14 - 5.80 x10E6/uL Final  04/25/2020 4.49 4.22 - 5.81 MIL/uL Final   Hemoglobin  Date Value Ref Range Status  10/30/2020 13.1 13.0 - 17.7 g/dL Final   Hematocrit  Date Value Ref Range Status  10/30/2020 38.5 37.5 - 51.0 % Final   MCHC  Date Value Ref Range Status  10/30/2020 34.0 31.5 - 35.7 g/dL Final  04/25/2020 33.2 30.0 - 36.0 g/dL Final   MCH  Date Value Ref Range Status  10/30/2020 29.0 26.6 - 33.0 pg Final  04/25/2020 29.4 26.0 - 34.0 pg Final   MCV  Date Value Ref Range Status  10/30/2020 85 79 - 97 fL Final   No results found for: "PLTCOUNTKUC", "LABPLAT", "POCPLA" RDW  Date Value Ref Range Status  10/30/2020 12.7 11.6 - 15.4 % Final           

## 2021-07-26 ENCOUNTER — Other Ambulatory Visit: Payer: Self-pay | Admitting: Family Medicine

## 2021-07-26 DIAGNOSIS — E1159 Type 2 diabetes mellitus with other circulatory complications: Secondary | ICD-10-CM

## 2021-08-09 ENCOUNTER — Other Ambulatory Visit: Payer: Self-pay | Admitting: Family Medicine

## 2021-08-09 DIAGNOSIS — I10 Essential (primary) hypertension: Secondary | ICD-10-CM

## 2021-08-11 NOTE — Telephone Encounter (Signed)
Refilled 04/28/2021 #90 1 refill. Requested Prescriptions  Pending Prescriptions Disp Refills  . amLODipine (NORVASC) 5 MG tablet [Pharmacy Med Name: AMLODIPINE BESYLATE 5 MG TAB] 90 tablet 1    Sig: TAKE 1 TABLET BY MOUTH ONCE DAILY     Cardiovascular: Calcium Channel Blockers 2 Passed - 08/09/2021  9:59 AM      Passed - Last BP in normal range    BP Readings from Last 1 Encounters:  04/09/21 115/75         Passed - Last Heart Rate in normal range    Pulse Readings from Last 1 Encounters:  04/09/21 70         Passed - Valid encounter within last 6 months    Recent Outpatient Visits          4 months ago Type 2 diabetes mellitus without complication, without long-term current use of insulin [facility])   [facility] Maple Hudson., MD   9 months ago Benign essential HTN   [facility] Maple Hudson., MD   11 months ago Welcome to [employer] preventive visit   [facility] Maple Hudson., MD   1 year ago Type 2 diabetes mellitus with other circulatory complication, without long-term current use of insulin [facility])   [facility] Maple Hudson., MD   1 year ago Type 2 diabetes mellitus with other circulatory complication, without long-term current use of insulin [facility])   [facility] Maple Hudson., MD      Future Appointments            In 1 month Maple Hudson., MD [facility], [facility]

## 2021-08-18 ENCOUNTER — Telehealth: Payer: Self-pay | Admitting: Family Medicine

## 2021-08-18 NOTE — Telephone Encounter (Signed)
Copied from CRM [id number]. Topic: Medicare AWV >> Aug 18, 2021 11:59 AM Zannie Kehr wrote: Reason for CRM:  Left message for patient to call back and schedule Medicare Annual Wellness Visit (AWVI in office.   If unable to come into the office for AWVI,  please offer to do virtually or by telephone.  Last AWV: 08/29/2020   Please schedule at anytime with [facility] Health Advisor.  45 minute appointment for in office or Initial virtual/phone  Any questions, please contact me at [phone]

## 2021-08-18 NOTE — Telephone Encounter (Signed)
Patient is returning call to schedule wellness visit. Please follow up with patient.

## 2021-08-27 IMAGING — DX DG SHOULDER 2+V PORT*R*
1 series · 1 of 1 positions shown · non-contrast
Comparison: 03/29/2019.

CLINICAL DATA: Right shoulder surgery.

EXAM:
PORTABLE RIGHT SHOULDER

[shoulder ap]
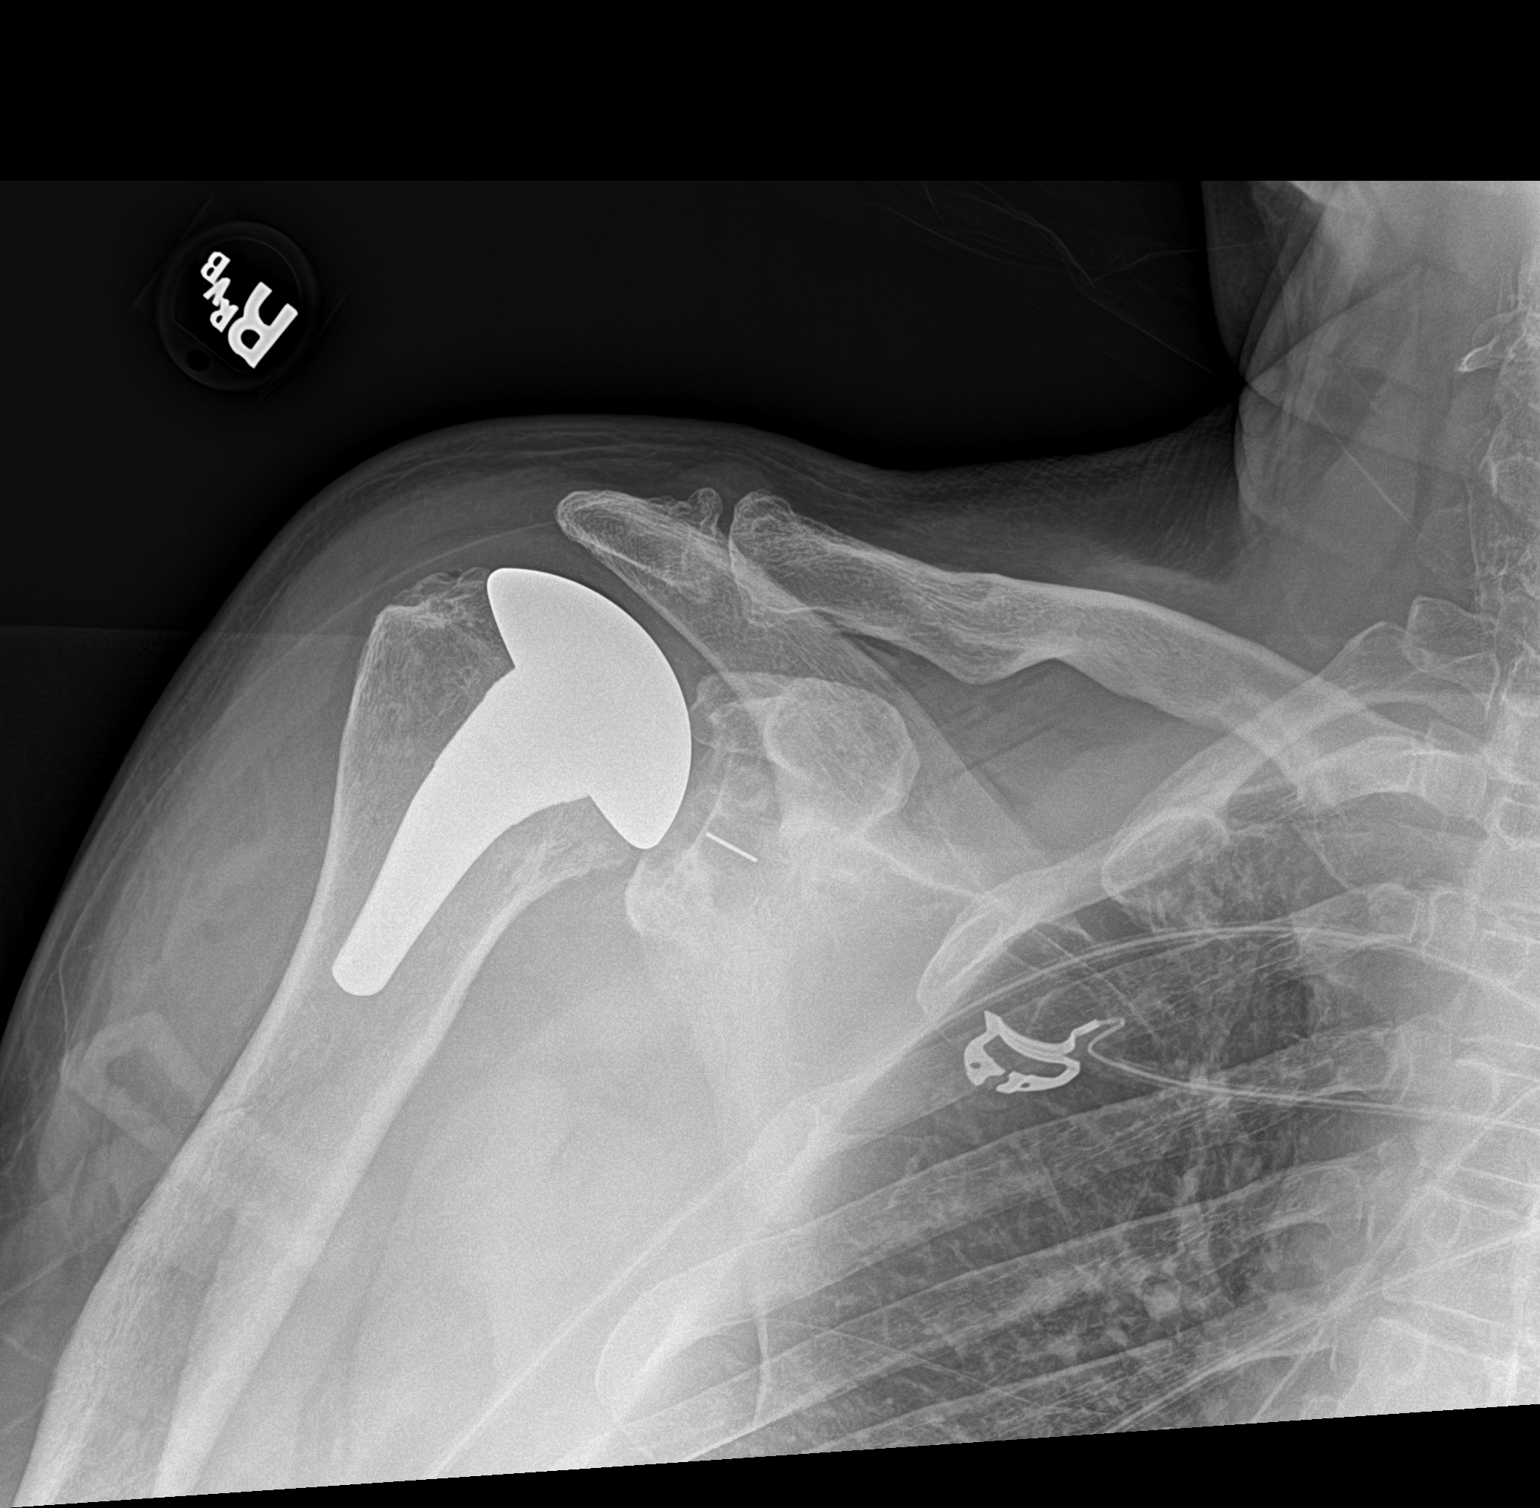

[1 of 1 positions shown; findings below may reference images not displayed]

FINDINGS: Right shoulder replacement. Hardware intact. Anatomic alignment on
one view. Acromioclavicular degenerative change.
IMPRESSION: Right shoulder replacement.  Hardware intact.  Anatomic alignment.

## 2021-09-02 ENCOUNTER — Ambulatory Visit (INDEPENDENT_AMBULATORY_CARE_PROVIDER_SITE_OTHER): Payer: HMO

## 2021-09-02 VITALS — Wt 195.0 lb

## 2021-09-02 DIAGNOSIS — Z0000 Encounter for general adult medical examination without abnormal findings: Secondary | ICD-10-CM | POA: Diagnosis not present

## 2021-09-02 NOTE — Patient Instructions (Signed)
Samuel Gentry , Thank you for taking time to come for your Medicare Wellness Visit. I appreciate your ongoing commitment to your health goals. Please review the following plan we discussed and let me know if I can assist you in the future.   Screening recommendations/referrals: Colonoscopy: 11/06/15 Recommended yearly ophthalmology/optometry visit for glaucoma screening and checkup Recommended yearly dental visit for hygiene and checkup  Vaccinations: Influenza vaccine: 10/30/20 Pneumococcal vaccine: 08/29/20 Tdap vaccine: 04/09/21 Shingles vaccine: n/d   Covid-19: 03/30/19  Advanced directives: no  Conditions/risks identified: none  Next appointment: Follow up in one year for your annual wellness visit. 09/07/22 @ 9:30 am by phone  Preventive Care 65 Years and Older, Male Preventive care refers to lifestyle choices and visits with your health care provider that can promote health and wellness. What does preventive care include? A yearly physical exam. This is also called an annual well check. Dental exams once or twice a year. Routine eye exams. Ask your health care provider how often you should have your eyes checked. Personal lifestyle choices, including: Daily care of your teeth and gums. Regular physical activity. Eating a healthy diet. Avoiding tobacco and drug use. Limiting alcohol use. Practicing safe sex. Taking low doses of aspirin every day. Taking vitamin and mineral supplements as recommended by your health care provider. What happens during an annual well check? The services and screenings done by your health care provider during your annual well check will depend on your age, overall health, lifestyle risk factors, and family history of disease. Counseling  Your health care provider may ask you questions about your: Alcohol use. Tobacco use. Drug use. Emotional well-being. Home and relationship well-being. Sexual activity. Eating habits. History of  falls. Memory and ability to understand (cognition). Work and work Astronomer. Screening  You may have the following tests or measurements: Height, weight, and BMI. Blood pressure. Lipid and cholesterol levels. These may be checked every 5 years, or more frequently if you are over 1 years old. Skin check. Lung cancer screening. You may have this screening every year starting at age 41 if you have a 30-pack-year history of smoking and currently smoke or have quit within the past 15 years. Fecal occult blood test (FOBT) of the stool. You may have this test every year starting at age 20. Flexible sigmoidoscopy or colonoscopy. You may have a sigmoidoscopy every 5 years or a colonoscopy every 10 years starting at age 90. Prostate cancer screening. Recommendations will vary depending on your family history and other risks. Hepatitis C blood test. Hepatitis B blood test. Sexually transmitted disease (STD) testing. Diabetes screening. This is done by checking your blood sugar (glucose) after you have not eaten for a while (fasting). You may have this done every 1-3 years. Abdominal aortic aneurysm (AAA) screening. You may need this if you are a current or former smoker. Osteoporosis. You may be screened starting at age 76 if you are at high risk. Talk with your health care provider about your test results, treatment options, and if necessary, the need for more tests. Vaccines  Your health care provider may recommend certain vaccines, such as: Influenza vaccine. This is recommended every year. Tetanus, diphtheria, and acellular pertussis (Tdap, Td) vaccine. You may need a Td booster every 10 years. Zoster vaccine. You may need this after age 33. Pneumococcal 13-valent conjugate (PCV13) vaccine. One dose is recommended after age 57. Pneumococcal polysaccharide (PPSV23) vaccine. One dose is recommended after age 75. Talk to your health care provider about  which screenings and vaccines you need and  how often you need them. This information is not intended to replace advice given to you by your health care provider. Make sure you discuss any questions you have with your health care provider. Document Released: 01/25/2015 Document Revised: 09/18/2015 Document Reviewed: 10/30/2014 Elsevier Interactive Patient Education  2017 [employer] Prevention in the Home Falls can cause injuries. They can happen to people of all ages. There are many things you can do to make your home safe and to help prevent falls. What can I do on the outside of my home? Regularly fix the edges of walkways and driveways and fix any cracks. Remove anything that might make you trip as you walk through a door, such as a raised step or threshold. Trim any bushes or trees on the path to your home. Use bright outdoor lighting. Clear any walking paths of anything that might make someone trip, such as rocks or tools. Regularly check to see if handrails are loose or broken. Make sure that both sides of any steps have handrails. Any raised decks and porches should have guardrails on the edges. Have any leaves, snow, or ice cleared regularly. Use sand or salt on walking paths during winter. Clean up any spills in your garage right away. This includes oil or grease spills. What can I do in the bathroom? Use night lights. Install grab bars by the toilet and in the tub and shower. Do not use towel bars as grab bars. Use non-skid mats or decals in the tub or shower. If you need to sit down in the shower, use a plastic, non-slip stool. Keep the floor dry. Clean up any water that spills on the floor as soon as it happens. Remove soap buildup in the tub or shower regularly. Attach bath mats securely with double-sided non-slip rug tape. Do not have throw rugs and other things on the floor that can make you trip. What can I do in the bedroom? Use night lights. Make sure that you have a light by your bed that is easy to  reach. Do not use any sheets or blankets that are too big for your bed. They should not hang down onto the floor. Have a firm chair that has side arms. You can use this for support while you get dressed. Do not have throw rugs and other things on the floor that can make you trip. What can I do in the kitchen? Clean up any spills right away. Avoid walking on wet floors. Keep items that you use a lot in easy-to-reach places. If you need to reach something above you, use a strong step stool that has a grab bar. Keep electrical cords out of the way. Do not use floor polish or wax that makes floors slippery. If you must use wax, use non-skid floor wax. Do not have throw rugs and other things on the floor that can make you trip. What can I do with my stairs? Do not leave any items on the stairs. Make sure that there are handrails on both sides of the stairs and use them. Fix handrails that are broken or loose. Make sure that handrails are as long as the stairways. Check any carpeting to make sure that it is firmly attached to the stairs. Fix any carpet that is loose or worn. Avoid having throw rugs at the top or bottom of the stairs. If you do have throw rugs, attach them to the floor with  carpet tape. Make sure that you have a light switch at the top of the stairs and the bottom of the stairs. If you do not have them, ask someone to add them for you. What else can I do to help prevent falls? Wear shoes that: Do not have high heels. Have rubber bottoms. Are comfortable and fit you well. Are closed at the toe. Do not wear sandals. If you use a stepladder: Make sure that it is fully opened. Do not climb a closed stepladder. Make sure that both sides of the stepladder are locked into place. Ask someone to hold it for you, if possible. Clearly mark and make sure that you can see: Any grab bars or handrails. First and last steps. Where the edge of each step is. Use tools that help you move  around (mobility aids) if they are needed. These include: Canes. Walkers. Scooters. Crutches. Turn on the lights when you go into a dark area. Replace any light bulbs as soon as they burn out. Set up your furniture so you have a clear path. Avoid moving your furniture around. If any of your floors are uneven, fix them. If there are any pets around you, be aware of where they are. Review your medicines with your doctor. Some medicines can make you feel dizzy. This can increase your chance of falling. Ask your doctor what other things that you can do to help prevent falls. This information is not intended to replace advice given to you by your health care provider. Make sure you discuss any questions you have with your health care provider. Document Released: 10/25/2008 Document Revised: 06/06/2015 Document Reviewed: 02/02/2014 Elsevier Interactive Patient Education  2017 [employer].

## 2021-09-02 NOTE — Progress Notes (Signed)
Virtual Visit via Telephone Note  I connected with  Samuel Gentry on 09/02/21 at  9:30 AM EDT by telephone and verified that I am speaking with the correct person using two identifiers.  Location: Patient: home Provider: [facility] Persons participating in the virtual visit: [place]   I discussed the limitations, risks, security and privacy concerns of performing an evaluation and management service by telephone and the availability of in person appointments. The patient expressed understanding and agreed to proceed.  Interactive audio and video telecommunications were attempted between this nurse and patient, however failed, due to patient having technical difficulties OR patient did not have access to video capability.  We continued and completed visit with audio only.  Some vital signs may be absent or patient reported.   Dionisio David, LPN  Subjective:   Samuel Gentry is a 66 y.o. male who presents for Medicare Annual/Subsequent preventive examination.  Review of Systems     Cardiac Risk Factors include: advanced age (>88mn, >>7women);diabetes mellitus;male gender     Objective:    There were no vitals filed for this visit. There is no height or weight on file to calculate BMI.     09/02/2021    9:34 AM 05/02/2020    5:54 AM 04/25/2020    8:52 AM 12/29/2013   11:49 AM 12/27/2013    1:19 PM  Advanced Directives  Does Patient Have a Medical Advance Directive? No No No Yes Yes  Type of APersonnel officerLiving will  Does patient want to make changes to medical advance directive?     No - Patient declined  Copy of [place] Chart?     No - copy requested  Would patient like information on creating a medical advance directive? No - Patient declined No - Patient declined       Current Medications (verified) Outpatient Encounter Medications as of 09/02/2021  Medication Sig   amLODipine (NORVASC)  5 MG tablet TAKE 1 TABLET(5 MG) BY MOUTH DAILY   aspirin 325 MG tablet Take 325 mg by mouth daily.    BD PEN NEEDLE NANO 2ND GEN 32G X 4 MM MISC USE AS DIRECTED WITH TRESIBA EVERY DAY   blood glucose meter kit and supplies Dispense based on patient and insurance preference. Use up to once daily as directed. (FOR ICD-10 E11.59).   Continuous Blood Gluc Receiver (FREESTYLE LIBRE 2 READER) DEVI Use to check BS up to four times a day   Continuous Blood Gluc Sensor (FREESTYLE LIBRE 2 SENSOR) MISC Use as directed   insulin degludec (TRESIBA FLEXTOUCH) 100 UNIT/ML FlexTouch Pen ADMINISTER UP TO 30 UNITS UNDER THE SKIN DAILY AS DIRECTED Strength: 100 UNIT/ML   Lancets (ONETOUCH DELICA PLUS [id number] MISC TEST ONCE DAILY   lisinopril (ZESTRIL) 20 MG tablet TAKE 1 TABLET(20 MG) BY MOUTH DAILY   metFORMIN (GLUCOPHAGE) 1000 MG tablet Take 1 tablet (1,000 mg total) by mouth 2 (two) times daily with a meal.   MULTIPLE VITAMINS PO Take 1 tablet by mouth daily.   omeprazole (PRILOSEC) 20 MG capsule Take 1 capsule (20 mg total) by mouth 2 (two) times daily before a meal. (Patient taking differently: Take 20 mg by mouth daily.)   ONETOUCH ULTRA test strip TEST ONCE DAILY   rosuvastatin (CRESTOR) 40 MG tablet TAKE 1 TABLET(40 MG) BY MOUTH DAILY   sertraline (ZOLOFT) 50 MG tablet TAKE 1 TABLET(50 MG) BY MOUTH DAILY  TRULICITY 5.92 TW/4.4QK SOPN USE 1 INJECTION SUB-Q SAME DAY EACH WEEKROTATE THE SITE   No facility-administered encounter medications on file as of 09/02/2021.    Allergies (verified) Prednisone   History: Past Medical History:  Diagnosis Date   Anxiety    Arthritis    Coronary atherosclerosis of native coronary artery 11/19/2004   DES to LAD x2, OM1, and CX [place] 11/2004   DDD (degenerative disc disease), lumbar    Depression    GERD (gastroesophageal reflux disease)    Other and unspecified hyperlipidemia    Status post cardiac catheterization 2003   [facility]   Type II or  unspecified type diabetes mellitus without mention of complication, not stated as uncontrolled    TYPE 2    Unspecified essential hypertension    Wears glasses    Past Surgical History:  Procedure Laterality Date   ANKLE FUSION  2003   left ankle   BACK SURGERY  93,97   lumb x2   CARDIAC CATHETERIZATION  2003   COLONOSCOPY WITH PROPOFOL N/A 11/06/2015   Procedure: COLONOSCOPY WITH PROPOFOL;  Surgeon: Robert Bellow, MD;  Location: [facility] ENDOSCOPY;  Service: Endoscopy;  Laterality: N/A;   CORONARY ANGIOPLASTY  2006   DORSAL COMPARTMENT RELEASE Left 12/29/2013   Procedure: LEFT WRIST RELEASE DORSAL COMPARTMENT (DEQUERVAIN);  Surgeon: Jolyn Nap, MD;  Location: [place];  Service: Orthopedics;  Laterality: Left;   ESOPHAGOGASTRODUODENOSCOPY (EGD) WITH PROPOFOL N/A 11/06/2015   Procedure: ESOPHAGOGASTRODUODENOSCOPY (EGD) WITH PROPOFOL;  Surgeon: Robert Bellow, MD;  Location: [facility] ENDOSCOPY;  Service: Endoscopy;  Laterality: N/A;   KNEE SURGERY  1978   rt   LEFT HEART CATH AND CORONARY ANGIOGRAPHY Left 07/12/2018   Procedure: LEFT HEART CATH AND CORONARY ANGIOGRAPHY;  Surgeon: Corey Skains, MD;  Location: [place] [facility];  Service: Cardiovascular;  Laterality: Left;   NECK SURGERY  2010   TOTAL SHOULDER ARTHROPLASTY Right 05/02/2020   Procedure: TOTAL SHOULDER ARTHROPLASTY;  Surgeon: Tania Ade, MD;  Location: [facility];  Service: Orthopedics;  Laterality: Right;   Family History  Problem Relation Age of Onset   Hyperlipidemia Mother    Diabetes Father    Prostate cancer Father    Healthy Brother    Colon cancer Neg Hx    Breast cancer Neg Hx    Social History   Socioeconomic History   Marital status: Married    Spouse name: Not on file   Number of children: Not on file   Years of education: Not on file   Highest education level: Not on file  Occupational History   Not on file  Tobacco Use   Smoking status: Former    Packs/day: 2.00     Years: 8.00    Total pack years: 16.00    Types: Cigarettes    Quit date: 02/08/1982    Years since quitting: 39.5   Smokeless tobacco: Never  Vaping Use   Vaping Use: Never used  Substance and Sexual Activity   Alcohol use: No   Drug use: No   Sexual activity: Not on file  Other Topics Concern   Not on file  Social History Narrative   Not on file   Social Determinants of Health   Financial Resource Strain: Low Risk  (09/02/2021)   Overall Financial Resource Strain (CARDIA)    Difficulty of Paying Living Expenses: Not very hard  Food Insecurity: No Food Insecurity (09/02/2021)   Hunger Vital Sign    Worried  About Running Out of Food in the Last Year: Never true    Ran Out of Food in the Last Year: Never true  Transportation Needs: No Transportation Needs (09/02/2021)   PRAPARE - Hydrologist (Medical): No    Lack of Transportation (Non-Medical): No  Physical Activity: Sufficiently Active (09/02/2021)   Exercise Vital Sign    Days of Exercise per Week: 3 days    Minutes of Exercise per Session: 60 min  Stress: No Stress Concern Present (09/02/2021)   [place]    Feeling of Stress : Not at all  Social Connections: Moderately Integrated (09/02/2021)   Social Connection and Isolation Panel [NHANES]    Frequency of Communication with Friends and Family: More than three times a week    Frequency of Social Gatherings with Friends and Family: Once a week    Attends Religious Services: More than 4 times per year    Active Member of [employer] or Organizations: No    Attends Music therapist: Never    Marital Status: Married    Tobacco Counseling Counseling given: Not Answered   Clinical Intake:  Pre-visit preparation completed: Yes  Pain : No/denies pain     Nutritional Risks: None Diabetes: Yes CBG done?: No Did pt. bring in CBG monitor from home?: No  How often  do you need to have someone help you when you read instructions, pamphlets, or other written materials from your doctor or pharmacy?: 1 - Never  Diabetic?yes Nutrition Risk Assessment:  Has the patient had any N/V/D within the last 2 months?  No  Does the patient have any non-healing wounds?  No  Has the patient had any unintentional weight loss or weight gain?  No   Diabetes:  Is the patient diabetic?  Yes  If diabetic, was a CBG obtained today?  No  Did the patient bring in their glucometer from home?  No  How often do you monitor your CBG's? 2-3 times/day.   Financial Strains and Diabetes Management:  Are you having any financial strains with the device, your supplies or your medication? No .  Does the patient want to be seen by Chronic Care Management for management of their diabetes?  No  Would the patient like to be referred to a Nutritionist or for Diabetic Management?  No   Diabetic Exams:  Diabetic Eye Exam: Completed 04/14/21. Pt has been advised about the importance in completing this exam.  Diabetic Foot Exam: Completed 10/07/16. Pt has been advised about the importance in completing this exam. Dr.Gilbert looks at feet   Interpreter Needed?: No  Information entered by :: Kirke Shaggy, LPN   Activities of Daily Living    09/02/2021    9:35 AM 04/09/2021    8:24 AM  In your present state of health, do you have any difficulty performing the following activities:  Hearing? 0 1  Vision? 0 0  Difficulty concentrating or making decisions? 0 0  Walking or climbing stairs? 0 0  Dressing or bathing? 0 0  Doing errands, shopping? 0 0  Preparing Food and eating ? N   Using the Toilet? N   In the past six months, have you accidently leaked urine? N   Do you have problems with loss of bowel control? N   Managing your Medications? N   Managing your Finances? N   Housekeeping or managing your Housekeeping? N     Patient  Care Team: Jerrol Banana., MD as PCP -  General (Unknown Physician Specialty) Minna Merritts, MD as Consulting Physician (Cardiology) Jerrol Banana., MD (Family Medicine) Bary Castilla, Forest Gleason, MD (General Surgery) Cathi Roan, [facility] (Inactive) (Pharmacist)  Indicate any recent Medical Services you may have received from other than [facility] providers in the past year (date may be approximate).     Assessment:   This is a routine wellness examination for [place].  Hearing/Vision screen Hearing Screening - Comments:: No aids Vision Screening - Comments:: Wears glasses- Dr.Woodard  Dietary issues and exercise activities discussed: Current Exercise Habits: Home exercise routine, Type of exercise: walking, Time (Minutes): 60, Frequency (Times/Week): 3, Weekly Exercise (Minutes/Week): 180, Intensity: Mild   Goals Addressed             This Visit's Progress    DIET - EAT MORE FRUITS AND VEGETABLES         Depression Screen    09/02/2021    9:32 AM 04/09/2021    8:24 AM 08/29/2020   10:03 AM 02/22/2020   10:34 AM 09/21/2019   11:52 AM 08/18/2018    9:14 AM 10/14/2017    9:08 AM  PHQ 2/9 Scores  PHQ - 2 Score 0 0 0 0 0 1 0  PHQ- 9 Score 0 0 0 0 2 3 0    Fall Risk    09/02/2021    9:35 AM 04/09/2021    8:24 AM 08/29/2020   10:03 AM 02/22/2020   10:35 AM 09/21/2019   11:52 AM  Fall Risk   Falls in the past year? 0 1 1 0 1  Number falls in past yr: 0 1 1 0 0  Injury with Fall? 0 0 1 0 0  Risk for fall due to : No Fall Risks No Fall Risks History of fall(s)    Follow up Falls evaluation completed Falls evaluation completed Falls evaluation completed Falls evaluation completed Falls evaluation completed    [place]:  Any stairs in or around the home? No  If so, are there any without handrails? No  Home free of loose throw rugs in walkways, pet beds, electrical cords, etc? Yes  Adequate lighting in your home to reduce risk of falls? Yes   ASSISTIVE DEVICES UTILIZED TO PREVENT  FALLS:  Life alert? No  Use of a cane, walker or w/c? No  Grab bars in the bathroom? No  Shower chair or bench in shower? No  Elevated toilet seat or a handicapped toilet? Yes    Cognitive Function:        09/02/2021    9:38 AM 08/29/2020    9:43 AM  6CIT Screen  What Year? 0 points 0 points  What month? 0 points 0 points  What time? 0 points 0 points  Count back from 20 0 points 0 points  Months in reverse 0 points 0 points  Repeat phrase 0 points 0 points  Total Score 0 points 0 points    Immunizations Immunization History  Administered Date(s) Administered   Fluad Quad(high Dose 65+) 10/30/2020   Influenza,inj,Quad PF,6+ Mos 11/25/2015, 10/07/2016, 10/14/2017, 09/21/2019   Janssen (J&J) SARS-COV-2 Vaccination 03/30/2019   PNEUMOCOCCAL CONJUGATE-20 08/29/2020   Pneumococcal Polysaccharide-23 10/07/2016   Tdap 01/02/2010, 04/09/2021    TDAP status: Up to date  Flu Vaccine status: Up to date  Pneumococcal vaccine status: Up to date  Covid-19 vaccine status: Completed vaccines  Qualifies for Shingles Vaccine?  Yes   Zostavax completed No   Shingrix Completed?: No.    Education has been provided regarding the importance of this vaccine. Patient has been advised to call insurance company to determine out of pocket expense if they have not yet received this vaccine. Advised may also receive vaccine at local pharmacy or Health Dept. Verbalized acceptance and understanding.  Screening Tests Health Maintenance  Topic Date Due   Zoster Vaccines- Shingrix (1 of 2) Never done   FOOT EXAM  10/07/2017   COVID-19 Vaccine (2 - Janssen risk series) 04/27/2019   Diabetic kidney evaluation - Urine ACR  08/18/2019   INFLUENZA VACCINE  08/12/2021   HEMOGLOBIN A1C  10/10/2021   Diabetic kidney evaluation - GFR measurement  10/30/2021   OPHTHALMOLOGY EXAM  04/15/2022   COLONOSCOPY (Pts 45-38yr Insurance coverage will need to be confirmed)  11/05/2025   TETANUS/TDAP  04/10/2031    Pneumonia Vaccine 66 Years old  Completed   Hepatitis C Screening  Completed   HPV VACCINES  Aged Out    Health Maintenance  Health Maintenance Due  Topic Date Due   Zoster Vaccines- Shingrix (1 of 2) Never done   FOOT EXAM  10/07/2017   COVID-19 Vaccine (2 - Janssen risk series) 04/27/2019   Diabetic kidney evaluation - Urine ACR  08/18/2019   INFLUENZA VACCINE  08/12/2021    Colorectal cancer screening: Type of screening: Colonoscopy. Completed 11/06/15. Repeat every 10 years  Lung Cancer Screening: (Low Dose CT Chest recommended if Age 66-80years, 30 pack-year currently smoking OR have quit w/in 15years.) does not qualify.    Additional Screening:  Hepatitis C Screening: does qualify; Completed 02/04/12  Vision Screening: Recommended annual ophthalmology exams for early detection of glaucoma and other disorders of the eye. Is the patient up to date with their annual eye exam?  Yes  Who is the provider or what is the name of the office in which the patient attends annual eye exams? Dr.Woodard If pt is not established with a provider, would they like to be referred to a provider to establish care? No .   Dental Screening: Recommended annual dental exams for proper oral hygiene  Community Resource Referral / Chronic Care Management: CRR required this visit?  No   CCM required this visit?  No      Plan:     I have personally reviewed and noted the following in the patient's chart:   Medical and social history Use of alcohol, tobacco or illicit drugs  Current medications and supplements including opioid prescriptions. Patient is not currently taking opioid prescriptions. Functional ability and status Nutritional status Physical activity Advanced directives List of other physicians Hospitalizations, surgeries, and ER visits in previous 12 months Vitals Screenings to include cognitive, depression, and falls Referrals and appointments  In addition, I have  reviewed and discussed with patient certain preventive protocols, quality metrics, and best practice recommendations. A written personalized care plan for preventive services as well as general preventive health recommendations were provided to patient.     LDionisio David LPN   [id number]  Nurse Notes: none

## 2021-09-22 ENCOUNTER — Other Ambulatory Visit: Payer: Self-pay | Admitting: Family Medicine

## 2021-09-22 DIAGNOSIS — E119 Type 2 diabetes mellitus without complications: Secondary | ICD-10-CM

## 2021-09-30 ENCOUNTER — Ambulatory Visit (INDEPENDENT_AMBULATORY_CARE_PROVIDER_SITE_OTHER): Payer: HMO | Admitting: Family Medicine

## 2021-09-30 ENCOUNTER — Encounter: Payer: Self-pay | Admitting: Family Medicine

## 2021-09-30 VITALS — BP 120/64 | HR 67 | Resp 16 | Ht 70.0 in | Wt 193.0 lb

## 2021-09-30 DIAGNOSIS — M25511 Pain in right shoulder: Secondary | ICD-10-CM

## 2021-09-30 DIAGNOSIS — I2510 Atherosclerotic heart disease of native coronary artery without angina pectoris: Secondary | ICD-10-CM

## 2021-09-30 DIAGNOSIS — E7849 Other hyperlipidemia: Secondary | ICD-10-CM | POA: Diagnosis not present

## 2021-09-30 DIAGNOSIS — Z125 Encounter for screening for malignant neoplasm of prostate: Secondary | ICD-10-CM

## 2021-09-30 DIAGNOSIS — Z794 Long term (current) use of insulin: Secondary | ICD-10-CM

## 2021-09-30 DIAGNOSIS — Z955 Presence of coronary angioplasty implant and graft: Secondary | ICD-10-CM | POA: Diagnosis not present

## 2021-09-30 DIAGNOSIS — Z0000 Encounter for general adult medical examination without abnormal findings: Secondary | ICD-10-CM

## 2021-09-30 DIAGNOSIS — E119 Type 2 diabetes mellitus without complications: Secondary | ICD-10-CM | POA: Diagnosis not present

## 2021-09-30 DIAGNOSIS — M1990 Unspecified osteoarthritis, unspecified site: Secondary | ICD-10-CM

## 2021-09-30 DIAGNOSIS — I10 Essential (primary) hypertension: Secondary | ICD-10-CM | POA: Diagnosis not present

## 2021-09-30 DIAGNOSIS — G8929 Other chronic pain: Secondary | ICD-10-CM

## 2021-09-30 DIAGNOSIS — M25512 Pain in left shoulder: Secondary | ICD-10-CM

## 2021-09-30 DIAGNOSIS — Z23 Encounter for immunization: Secondary | ICD-10-CM | POA: Diagnosis not present

## 2021-09-30 DIAGNOSIS — G4733 Obstructive sleep apnea (adult) (pediatric): Secondary | ICD-10-CM

## 2021-09-30 NOTE — Progress Notes (Signed)
Complete physical exam  I,[name],acting as a scribe for Samuel Durie, MD.,have documented all relevant documentation on the behalf of Samuel Durie, MD,as directed by  Samuel Durie, MD while in the presence of Samuel Durie, MD.   Patient: Samuel Gentry   DOB: [birth year]   66 y.o. Male  MRN: [id number] Visit Date: 09/30/2021  Today's healthcare provider: [name], MD   Chief Complaint  Patient presents with   Annual Exam   Subjective    Samuel Gentry is a 66 y.o. male who presents today for a complete physical exam.  Patient does complain of being tired a lot and states hurt all over.  His wife tells him he snores with no absolute apneic spells. HPI  Patient had AWV with [facility] on 09/02/2021.  Past Medical History:  Diagnosis Date   Anxiety    Arthritis    Coronary atherosclerosis of native coronary artery 11/19/2004   DES to LAD x2, OM1, and CX [place] 11/2004   DDD (degenerative disc disease), lumbar    Depression    GERD (gastroesophageal reflux disease)    Other and unspecified hyperlipidemia    Status post cardiac catheterization 2003   [facility]   Type II or unspecified type diabetes mellitus without mention of complication, not stated as uncontrolled    TYPE 2    Unspecified essential hypertension    Wears glasses    Past Surgical History:  Procedure Laterality Date   ANKLE FUSION  2003   left ankle   BACK SURGERY  93,97   lumb x2   CARDIAC CATHETERIZATION  2003   COLONOSCOPY WITH PROPOFOL N/A 11/06/2015   Procedure: COLONOSCOPY WITH PROPOFOL;  Surgeon: Robert Bellow, MD;  Location: [facility];  Service: Endoscopy;  Laterality: N/A;   CORONARY ANGIOPLASTY  2006   DORSAL COMPARTMENT RELEASE Left 12/29/2013   Procedure: LEFT WRIST RELEASE DORSAL COMPARTMENT (DEQUERVAIN);  Surgeon: Jolyn Nap, MD;  Location: [place];  Service: Orthopedics;  Laterality: Left;    ESOPHAGOGASTRODUODENOSCOPY (EGD) WITH PROPOFOL N/A 11/06/2015   Procedure: ESOPHAGOGASTRODUODENOSCOPY (EGD) WITH PROPOFOL;  Surgeon: Robert Bellow, MD;  Location: [facility];  Service: Endoscopy;  Laterality: N/A;   KNEE SURGERY  1978   rt   LEFT HEART CATH AND CORONARY ANGIOGRAPHY Left 07/12/2018   Procedure: LEFT HEART CATH AND CORONARY ANGIOGRAPHY;  Surgeon: Corey Skains, MD;  Location: [facility];  Service: Cardiovascular;  Laterality: Left;   NECK SURGERY  2010   TOTAL SHOULDER ARTHROPLASTY Right 05/02/2020   Procedure: TOTAL SHOULDER ARTHROPLASTY;  Surgeon: Tania Ade, MD;  Location: [facility];  Service: Orthopedics;  Laterality: Right;   Social History   Socioeconomic History   Marital status: Married    Spouse name: Not on file   Number of children: Not on file   Years of education: Not on file   Highest education level: Not on file  Occupational History   Not on file  Tobacco Use   Smoking status: Former    Packs/day: 2.00    Years: 8.00    Total pack years: 16.00    Types: Cigarettes    Quit date: 02/08/1982    Years since quitting: 39.6   Smokeless tobacco: Never  Vaping Use   Vaping Use: Never used  Substance and Sexual Activity   Alcohol use: No   Drug use: No   Sexual activity: Not on file  Other Topics  Concern   Not on file  Social History Narrative   Not on file   Social Determinants of Health   Financial Resource Strain: Low Risk  (09/02/2021)   Overall Financial Resource Strain (CARDIA)    Difficulty of Paying Living Expenses: Not very hard  Food Insecurity: No Food Insecurity (09/02/2021)   Hunger Vital Sign    Worried About Running Out of Food in the Last Year: Never true    Ran Out of Food in the Last Year: Never true  Transportation Needs: No Transportation Needs (09/02/2021)   PRAPARE - Hydrologist (Medical): No    Lack of Transportation (Non-Medical): No  Physical Activity: Sufficiently  Active (09/02/2021)   Exercise Vital Sign    Days of Exercise per Week: 3 days    Minutes of Exercise per Session: 60 min  Stress: No Stress Concern Present (09/02/2021)   [place]    Feeling of Stress : Not at all  Social Connections: Moderately Integrated (09/02/2021)   Social Connection and Isolation Panel [NHANES]    Frequency of Communication with Friends and Family: More than three times a week    Frequency of Social Gatherings with Friends and Family: Once a week    Attends Religious Services: More than 4 times per year    Active Member of [employer] or Organizations: No    Attends Archivist Meetings: Never    Marital Status: Married  Human resources officer Violence: Not At Risk (09/02/2021)   Humiliation, Afraid, Rape, and Kick questionnaire    Fear of Current or Ex-Partner: No    Emotionally Abused: No    Physically Abused: No    Sexually Abused: No   Family Status  Relation Name Status   Mother  Alive   Father  Deceased       diabetes   Brother  Alive   Neg Hx  (Not Specified)   Family History  Problem Relation Age of Onset   Hyperlipidemia Mother    Diabetes Father    Prostate cancer Father    Healthy Brother    Colon cancer Neg Hx    Breast cancer Neg Hx    Allergies  Allergen Reactions   Prednisone Other (See Comments)    Makes him very mean    Patient Care Team: Jerrol Banana., MD as PCP - General (Unknown Physician Specialty) Minna Merritts, MD as Consulting Physician (Cardiology) Jerrol Banana., MD (Family Medicine) Bary Castilla, Forest Gleason, MD (General Surgery) Cathi Roan, [facility] (Inactive) (Pharmacist)   Medications: Outpatient Medications Prior to Visit  Medication Sig   amLODipine (NORVASC) 5 MG tablet TAKE 1 TABLET(5 MG) BY MOUTH DAILY   aspirin 325 MG tablet Take 325 mg by mouth daily.    BD PEN NEEDLE NANO 2ND GEN 32G X 4 MM MISC USE AS DIRECTED WITH TRESIBA  EVERY DAY   blood glucose meter kit and supplies Dispense based on patient and insurance preference. Use up to once daily as directed. (FOR ICD-10 E11.59).   Continuous Blood Gluc Receiver (FREESTYLE LIBRE 2 READER) DEVI Use to check BS up to four times a day   Continuous Blood Gluc Sensor (FREESTYLE LIBRE 2 SENSOR) MISC Use as directed   insulin degludec (TRESIBA FLEXTOUCH) 100 UNIT/ML FlexTouch Pen INJECT UP TO 30 UNITS SUBCUTANEOUSLY ONCE DAILY AS DIRECTED   Lancets (ONETOUCH DELICA PLUS [id number]) MISC TEST ONCE DAILY   lisinopril (ZESTRIL)  20 MG tablet TAKE 1 TABLET BY MOUTH ONCE DAILY   metFORMIN (GLUCOPHAGE) 1000 MG tablet Take 1 tablet (1,000 mg total) by mouth 2 (two) times daily with a meal.   MULTIPLE VITAMINS PO Take 1 tablet by mouth daily.   omeprazole (PRILOSEC) 20 MG capsule Take 1 capsule (20 mg total) by mouth 2 (two) times daily before a meal. (Patient taking differently: Take 20 mg by mouth daily.)   ONETOUCH ULTRA test strip TEST ONCE DAILY   rosuvastatin (CRESTOR) 40 MG tablet TAKE 1 TABLET(40 MG) BY MOUTH DAILY   sertraline (ZOLOFT) 50 MG tablet TAKE 1 TABLET(50 MG) BY MOUTH DAILY   TRULICITY 9.39 QZ/0.0PQ SOPN USE 1 INJECTION SUB-Q SAME DAY EACH WEEKROTATE THE SITE   No facility-administered medications prior to visit.    Review of Systems  HENT:  Positive for hearing loss.   Musculoskeletal:  Positive for arthralgias and joint swelling.  All other systems reviewed and are negative.   Last hemoglobin A1c Lab Results  Component Value Date   HGBA1C 6.5 (H) 10/03/2021      Objective     BP 120/64 (BP Location: Left Arm, Patient Position: Sitting, Cuff Size: Large)   Pulse 67   Resp 16   Ht 5' 10"  (1.778 m)   Wt 193 lb (87.5 kg)   SpO2 97%   BMI 27.69 kg/m  BP Readings from Last 3 Encounters:  09/30/21 120/64  04/09/21 115/75  10/30/20 116/81   Wt Readings from Last 3 Encounters:  09/30/21 193 lb (87.5 kg)  09/02/21 195 lb (88.5 kg)  04/09/21 195  lb (88.5 kg)       Physical Exam Vitals reviewed.  Constitutional:      Appearance: Normal appearance.  HENT:     Head: Normocephalic and atraumatic.     Right Ear: External ear normal.     Left Ear: External ear normal.  Eyes:     General: No scleral icterus.    Conjunctiva/sclera: Conjunctivae normal.  Cardiovascular:     Rate and Rhythm: Normal rate and regular rhythm.     Pulses: Normal pulses.     Heart sounds: Normal heart sounds.  Pulmonary:     Effort: Pulmonary effort is normal.     Breath sounds: Normal breath sounds.  Genitourinary:    Penis: Normal.      Testes: Normal.     Prostate: Normal.     Rectum: Normal.  Musculoskeletal:     Right lower leg: No edema.     Left lower leg: No edema.  Skin:    General: Skin is warm and dry.  Neurological:     General: No focal deficit present.     Mental Status: He is alert and oriented to person, place, and time.  Psychiatric:        Mood and Affect: Mood normal.        Behavior: Behavior normal.        Thought Content: Thought content normal.        Judgment: Judgment normal.       Last depression screening scores    09/30/2021   10:38 AM 09/02/2021    9:32 AM 04/09/2021    8:24 AM  PHQ 2/9 Scores  PHQ - 2 Score 0 0 0  PHQ- 9 Score 2 0 0   Last fall risk screening    09/30/2021   10:38 AM  Fall Risk   Falls in the past year? 1  Number falls  in past yr: 1  Injury with Fall? 0  Risk for fall due to : History of fall(s)  Follow up Falls evaluation completed   Last Audit-C alcohol use screening    09/30/2021   10:38 AM  Alcohol Use Disorder Test (AUDIT)  1. How often do you have a drink containing alcohol? 0  2. How many drinks containing alcohol do you have on a typical day when you are drinking? 0  3. How often do you have six or more drinks on one occasion? 0  AUDIT-C Score 0   A score of 3 or more in women, and 4 or more in men indicates increased risk for alcohol abuse, EXCEPT if all of the  points are from question 1   No results found for any visits on 09/30/21.  Assessment & Plan    Routine Health Maintenance and Physical Exam  Exercise Activities and Dietary recommendations  Goals       "I need help affording my medications" (pt-stated)      Pharmacist Clinical Goal(s): Over the next 10 days, Mr.. Provence will provide the necessary supplementary documents (proof of out of pocket prescription expenditure, proof of household income) needed for medication assistance applications to [facility] pharmacist.   Interventions:  CCM pharmacist will investigate different medication assistance foundations and programs for Mr. Saleeby to apply for, primarily for [place] and [place].   Partially completed: 02/21/2018, patient has been approved for medication assistance for [place] and [place] A[place], expires 01 28 2021  Updated 06/20/18: [facility] pharmacist completed refill, copy uploaded under media tab.  As of 02/28/18, patient was denied for [place] assistance [facility]) for "over income"; contacted Marjory Lies, Wellsite geologist for [employer], who recommended sending in denial letter for redetermination. Decision will be made within 2 weeks   03/14/18: Update from [employer], representative [facility] on 02/28/18 provided incorrect information, patient needs to supply a financial hardship letter and an out of pocket test claim from the patient's pharmacy.   Current Barriers:  No prescription insurance coverage Not able to use manufacturer's coupons due to lack of commercial coverage  Patient Self Care Activities:  Collaborate with [facility] pharmacist and provide necessary paperwork    Please see past updates related to this goal by clicking on the "Past Updates" button in the selected goal         DIET - EAT MORE FRUITS AND VEGETABLES        Immunization History  Administered Date(s) Administered   Fluad Quad(high Dose 65+) 10/30/2020   Influenza,inj,Quad PF,6+ Mos 11/25/2015, 10/07/2016,  10/14/2017, 09/21/2019   Janssen (J&J) SARS-COV-2 Vaccination 03/30/2019   PNEUMOCOCCAL CONJUGATE-20 08/29/2020   Pneumococcal Polysaccharide-23 10/07/2016   Tdap 01/02/2010, 04/09/2021    Health Maintenance  Topic Date Due   Zoster Vaccines- Shingrix (1 of 2) Never done   FOOT EXAM  10/07/2017   COVID-19 Vaccine (2 - Janssen risk series) 04/27/2019   Diabetic kidney evaluation - Urine ACR  08/18/2019   INFLUENZA VACCINE  08/12/2021   Diabetic kidney evaluation - GFR measurement  10/30/2021   HEMOGLOBIN A1C  10/10/2021   OPHTHALMOLOGY EXAM  04/15/2022   COLONOSCOPY (Pts 45-38yr Insurance coverage will need to be confirmed)  11/05/2025   TETANUS/TDAP  04/10/2031   Pneumonia Vaccine 66 Years old  Completed   Hepatitis C Screening  Completed   HPV VACCINES  Aged Out    Discussed health benefits of physical activity, and encouraged him to engage in regular exercise appropriate for his age and  condition.  1. Annual physical exam Up-to-date on screening - Lipid panel - TSH - CBC w/Diff/Platelet - Comprehensive Metabolic Panel (CMET) - Hemoglobin A1c - Testosterone - Sed Rate (ESR)  2. Type 2 diabetes mellitus without complication, with long-term current use of insulin (HCC) Goal A1c less than 7 - Lipid panel - TSH - CBC w/Diff/Platelet - Comprehensive Metabolic Panel (CMET) - Hemoglobin A1c - Testosterone - Sed Rate (ESR)  3. Benign essential HTN Good control - Lipid panel - TSH - CBC w/Diff/Platelet - Comprehensive Metabolic Panel (CMET) - Hemoglobin A1c - Testosterone - Sed Rate (ESR)  4. Other hyperlipidemia Rosuvastatin 40 - Lipid panel - TSH - CBC w/Diff/Platelet - Comprehensive Metabolic Panel (CMET) - Hemoglobin A1c - Testosterone - Sed Rate (ESR)  5. OSA (obstructive sleep apnea) Work-up for apnea with home sleep study--Epworth today is 16. - Lipid panel - TSH - CBC w/Diff/Platelet - Comprehensive Metabolic Panel (CMET) - Hemoglobin A1c -  Testosterone - Sed Rate (ESR) - Home sleep test  6. CAD in native artery -Factors treated - Lipid panel - TSH - CBC w/Diff/Platelet - Comprehensive Metabolic Panel (CMET) - Hemoglobin A1c - Testosterone - Sed Rate (ESR)  7. Prostate cancer screening  - PSA - Testosterone  8. Need for immunization against influenza  - Flu Vaccine QUAD High Dose(Fluad)  9. Chronic pain of both shoulders   10. Chronic inflammatory arthritis   11. S/P coronary artery stent placement    No follow-ups on file.     I, Samuel Durie, MD, have reviewed all documentation for this visit. The documentation on 10/04/21 for the exam, diagnosis, procedures, and orders are all accurate and complete.    Sherian Valenza Cranford Mon, MD  [facility] [phone] (phone) [phone] (fax)  [place]

## 2021-10-03 DIAGNOSIS — Z794 Long term (current) use of insulin: Secondary | ICD-10-CM | POA: Diagnosis not present

## 2021-10-03 DIAGNOSIS — G4733 Obstructive sleep apnea (adult) (pediatric): Secondary | ICD-10-CM | POA: Diagnosis not present

## 2021-10-03 DIAGNOSIS — I2510 Atherosclerotic heart disease of native coronary artery without angina pectoris: Secondary | ICD-10-CM | POA: Diagnosis not present

## 2021-10-03 DIAGNOSIS — Z0000 Encounter for general adult medical examination without abnormal findings: Secondary | ICD-10-CM | POA: Diagnosis not present

## 2021-10-03 DIAGNOSIS — E7849 Other hyperlipidemia: Secondary | ICD-10-CM | POA: Diagnosis not present

## 2021-10-03 DIAGNOSIS — E119 Type 2 diabetes mellitus without complications: Secondary | ICD-10-CM | POA: Diagnosis not present

## 2021-10-03 DIAGNOSIS — I10 Essential (primary) hypertension: Secondary | ICD-10-CM | POA: Diagnosis not present

## 2021-10-03 DIAGNOSIS — Z125 Encounter for screening for malignant neoplasm of prostate: Secondary | ICD-10-CM | POA: Diagnosis not present

## 2021-10-04 LAB — CBC WITH DIFFERENTIAL/PLATELET
Basophils Absolute: 0 10*3/uL (ref 0.0–0.2)
Basos: 1 %
EOS (ABSOLUTE): 0.4 10*3/uL (ref 0.0–0.4)
Eos: 6 %
Hematocrit: 38.6 % (ref 37.5–51.0)
Hemoglobin: 13.5 g/dL (ref 13.0–17.7)
Immature Grans (Abs): 0 10*3/uL (ref 0.0–0.1)
Immature Granulocytes: 1 %
Lymphocytes Absolute: 1.9 10*3/uL (ref 0.7–3.1)
Lymphs: 31 %
MCH: 31.3 pg (ref 26.6–33.0)
MCHC: 35 g/dL (ref 31.5–35.7)
MCV: 89 fL (ref 79–97)
Monocytes Absolute: 0.7 10*3/uL (ref 0.1–0.9)
Monocytes: 11 %
Neutrophils Absolute: 3.2 10*3/uL (ref 1.4–7.0)
Neutrophils: 50 %
Platelets: 209 10*3/uL (ref 150–450)
RBC: 4.32 x10E6/uL (ref 4.14–5.80)
RDW: 12.7 % (ref 11.6–15.4)
WBC: 6.2 10*3/uL (ref 3.4–10.8)

## 2021-10-04 LAB — COMPREHENSIVE METABOLIC PANEL
ALT: 41 IU/L (ref 0–44)
AST: 42 IU/L — ABNORMAL HIGH (ref 0–40)
Albumin/Globulin Ratio: 1.9 (ref 1.2–2.2)
Albumin: 4.8 g/dL (ref 3.9–4.9)
Alkaline Phosphatase: 70 IU/L (ref 44–121)
BUN/Creatinine Ratio: 26 — ABNORMAL HIGH (ref 10–24)
BUN: 27 mg/dL (ref 8–27)
Bilirubin Total: 0.3 mg/dL (ref 0.0–1.2)
CO2: 22 mmol/L (ref 20–29)
Calcium: 9.8 mg/dL (ref 8.6–10.2)
Chloride: 100 mmol/L (ref 96–106)
Creatinine, Ser: 1.02 mg/dL (ref 0.76–1.27)
Globulin, Total: 2.5 g/dL (ref 1.5–4.5)
Glucose: 119 mg/dL — ABNORMAL HIGH (ref 70–99)
Potassium: 5 mmol/L (ref 3.5–5.2)
Sodium: 138 mmol/L (ref 134–144)
Total Protein: 7.3 g/dL (ref 6.0–8.5)
eGFR: 81 mL/min/{1.73_m2} (ref >59)

## 2021-10-04 LAB — LIPID PANEL
Chol/HDL Ratio: 3.2 ratio (ref 0.0–5.0)
Cholesterol, Total: 135 mg/dL (ref 100–199)
HDL: 42 mg/dL (ref >39)
LDL Chol Calc (NIH): 81 mg/dL (ref 0–99)
Triglycerides: 57 mg/dL (ref 0–149)
VLDL Cholesterol Cal: 12 mg/dL (ref 5–40)

## 2021-10-04 LAB — TSH: TSH: 2.81 u[IU]/mL (ref 0.450–4.500)

## 2021-10-04 LAB — SEDIMENTATION RATE: Sed Rate: 2 mm/hr (ref 0–30)

## 2021-10-04 LAB — PSA: Prostate Specific Ag, Serum: 1.6 ng/mL (ref 0.0–4.0)

## 2021-10-04 LAB — TESTOSTERONE: Testosterone: 336 ng/dL (ref 264–916)

## 2021-10-04 LAB — HEMOGLOBIN A1C
Est. average glucose Bld gHb Est-mCnc: 140 mg/dL
Hgb A1c MFr Bld: 6.5 % — ABNORMAL HIGH (ref 4.8–5.6)

## 2021-10-10 ENCOUNTER — Other Ambulatory Visit: Payer: Self-pay | Admitting: Family Medicine

## 2021-10-10 NOTE — Telephone Encounter (Signed)
Requested medication (s) are due for refill today: expired medications  Requested medication (s) are on the active medication list: yes  Last refill:  08/13/20 #100 11 refills  Future visit scheduled: yes next year   Notes to clinic:  expired medication . Do you want to renew Rx?     Requested Prescriptions  Pending Prescriptions Disp Refills   BD PEN NEEDLE NANO U/F 32G X 4 MM MISC [Pharmacy Med Name: BD PEN NEEDLE NANO U/F 32G X 4 MM] 100 each 11    Sig: USE AS DIRECTED. WITH TRESIBA EVERY DAY     Endocrinology: Diabetes - Testing Supplies Passed - 10/10/2021  3:18 PM      Passed - Valid encounter within last 12 months    Recent Outpatient Visits           1 week ago Annual physical exam   [facility] Jerrol Banana., MD   6 months ago Type 2 diabetes mellitus without complication, without long-term current use of insulin [facility])   [facility] Jerrol Banana., MD   11 months ago Benign essential HTN   [facility] Jerrol Banana., MD   1 year ago [facility] preventive visit   [facility] Jerrol Banana., MD   1 year ago Type 2 diabetes mellitus with other circulatory complication, without long-term current use of insulin [facility])   [facility] Jerrol Banana., MD

## 2021-10-13 ENCOUNTER — Other Ambulatory Visit: Payer: Self-pay | Admitting: Family Medicine

## 2021-10-13 DIAGNOSIS — F320 Major depressive disorder, single episode, mild: Secondary | ICD-10-CM

## 2021-10-14 NOTE — Telephone Encounter (Signed)
Requested medication (s) are due for refill today: yes  Requested medication (s) are on the active medication list: yes  Last refill:  01/15/21 #90/3  Future visit scheduled: no  Notes to clinic:  Dr. Rosanna Randy pt PCP, had annual exam 2 weeks ago.      Requested Prescriptions  Pending Prescriptions Disp Refills   sertraline (ZOLOFT) 50 MG tablet [Pharmacy Med Name: SERTRALINE HCL 50 MG TAB] 90 tablet 3    Sig: TAKE 1 TABLET BY MOUTH ONCE DAILY     Psychiatry:  Antidepressants - SSRI - sertraline Failed - 10/13/2021  1:41 PM      Failed - AST in normal range and within 360 days    AST  Date Value Ref Range Status  10/03/2021 42 (H) 0 - 40 IU/L Final         Passed - ALT in normal range and within 360 days    ALT  Date Value Ref Range Status  10/03/2021 41 0 - 44 IU/L Final         Passed - Completed PHQ-2 or PHQ-9 in the last 360 days      Passed - Valid encounter within last 6 months    Recent Outpatient Visits           2 weeks ago Annual physical exam   [facility] Jerrol Banana., MD   6 months ago Type 2 diabetes mellitus without complication, without long-term current use of insulin [facility])   [facility] Jerrol Banana., MD   11 months ago Benign essential HTN   [facility] Jerrol Banana., MD   1 year ago [facility] preventive visit   [facility] Jerrol Banana., MD   1 year ago Type 2 diabetes mellitus with other circulatory complication, without long-term current use of insulin [facility])   [facility] Jerrol Banana., MD

## 2021-11-13 ENCOUNTER — Other Ambulatory Visit: Payer: Self-pay | Admitting: Family Medicine

## 2021-11-13 ENCOUNTER — Telehealth: Payer: Self-pay

## 2021-11-13 DIAGNOSIS — I10 Essential (primary) hypertension: Secondary | ICD-10-CM

## 2021-11-13 MED ORDER — AMLODIPINE BESYLATE 5 MG PO TABS: ORAL_TABLET | ORAL | 0 refills | Status: AC

## 2021-11-13 NOTE — Telephone Encounter (Signed)
Medication Refill - Medication:   Disp Refills Start End   amLODipine (NORVASC) 5 MG tablet         Has the patient contacted their pharmacy? Yes.   (Agent: If no, request that the patient contact the pharmacy for the refill. If patient does not wish to contact the pharmacy document the reason why and proceed with request.) (Agent: If yes, when and what did the pharmacy advise?) stated they sent request to dr on 26th, do not see,  pt is out of med  Preferred Pharmacy (with phone number or street name):  [place], [place] [address]  Phone: [phone] Fax: [phone]  Hours: Not open 24 hours   Has the patient been seen for an appointment in the last year OR does the patient have an upcoming appointment? Yes.    Agent: Please be advised that RX refills may take up to 3 business days. We ask that you follow-up with your pharmacy.

## 2021-11-13 NOTE — Telephone Encounter (Signed)
Called pt. Pt is unsure whether he will be following Dr. Rosanna Randy to his new office or staying with [facility].

## 2021-11-13 NOTE — Telephone Encounter (Signed)
November 13, 2021 Me      11/13/21 11:30 AM Note Called pt. Pt is unsure whether he will be following Dr. Rosanna Randy to his new office or staying with [facility].           11/13/21 11:27 AM You contacted [facility]"   This encounter is not signed. The conversation may still be ongoing.

## 2021-11-13 NOTE — Telephone Encounter (Signed)
Requested medications are due for refill today.  yes  Requested medications are on the active medications list.  yes  Last refill. 02/14/2021 #90 1 rf  Future visit scheduled.   no  Notes to clinic.  Dr. Rosanna Randy pt. Pt unsure if he will follow Dr. Rosanna Randy or stay with [facility]    Requested Prescriptions  Pending Prescriptions Disp Refills   amLODipine (NORVASC) 5 MG tablet 90 tablet 1     Cardiovascular: Calcium Channel Blockers 2 Passed - 11/13/2021  9:24 AM      Passed - Last BP in normal range    BP Readings from Last 1 Encounters:  09/30/21 120/64         Passed - Last Heart Rate in normal range    Pulse Readings from Last 1 Encounters:  09/30/21 67         Passed - Valid encounter within last 6 months    Recent Outpatient Visits           1 month ago Annual physical exam   [facility] Jerrol Banana., MD   7 months ago Type 2 diabetes mellitus without complication, without long-term current use of insulin [facility])   [facility] Jerrol Banana., MD   1 year ago Benign essential HTN   [facility] Jerrol Banana., MD   1 year ago [facility] preventive visit   [facility] Jerrol Banana., MD   1 year ago Type 2 diabetes mellitus with other circulatory complication, without long-term current use of insulin [facility])   [facility] Jerrol Banana., MD

## 2021-11-14 ENCOUNTER — Telehealth: Payer: Self-pay | Admitting: Family Medicine

## 2021-11-14 DIAGNOSIS — E1159 Type 2 diabetes mellitus with other circulatory complications: Secondary | ICD-10-CM

## 2021-11-14 MED ORDER — TRULICITY 0.75 MG/0.5ML ~~LOC~~ SOAJ: SUBCUTANEOUS | 3 refills | Status: AC

## 2021-11-14 NOTE — Telephone Encounter (Signed)
[facility] pharmacy faxed refill request for the following medications:   TRULICITY 4.50 TU/8.8KC SOPN    Please advise

## 2022-01-16 DIAGNOSIS — H903 Sensorineural hearing loss, bilateral: Secondary | ICD-10-CM | POA: Diagnosis not present

## 2022-02-09 ENCOUNTER — Telehealth: Payer: Self-pay | Admitting: Family Medicine

## 2022-02-09 NOTE — Telephone Encounter (Signed)
[facility] pharmacy faxed refill request for the following medications:   amLODipine (NORVASC) 5 MG tablet     Please advise

## 2022-02-10 NOTE — Telephone Encounter (Signed)
Patient scheduled to see Dr. Rosanna Randy at [facility] next week.

## 2022-02-11 ENCOUNTER — Telehealth: Payer: Self-pay | Admitting: Family Medicine

## 2022-02-11 DIAGNOSIS — I10 Essential (primary) hypertension: Secondary | ICD-10-CM

## 2022-02-13 NOTE — Telephone Encounter (Signed)
Pt is calling to check on the status of medication refill. Please advise [phone]

## 2022-02-13 NOTE — Telephone Encounter (Signed)
Medication was refused. Patient to request further refills at [facility] in [place]. Has established care there.

## 2022-02-18 DIAGNOSIS — E782 Mixed hyperlipidemia: Secondary | ICD-10-CM | POA: Diagnosis not present

## 2022-02-18 DIAGNOSIS — Z794 Long term (current) use of insulin: Secondary | ICD-10-CM | POA: Diagnosis not present

## 2022-02-18 DIAGNOSIS — F320 Major depressive disorder, single episode, mild: Secondary | ICD-10-CM | POA: Diagnosis not present

## 2022-02-18 DIAGNOSIS — E119 Type 2 diabetes mellitus without complications: Secondary | ICD-10-CM | POA: Diagnosis not present

## 2022-02-18 DIAGNOSIS — G4733 Obstructive sleep apnea (adult) (pediatric): Secondary | ICD-10-CM | POA: Diagnosis not present

## 2022-02-18 DIAGNOSIS — I350 Nonrheumatic aortic (valve) stenosis: Secondary | ICD-10-CM | POA: Diagnosis not present

## 2022-02-18 DIAGNOSIS — I2510 Atherosclerotic heart disease of native coronary artery without angina pectoris: Secondary | ICD-10-CM | POA: Diagnosis not present

## 2022-02-18 DIAGNOSIS — M0600 Rheumatoid arthritis without rheumatoid factor, unspecified site: Secondary | ICD-10-CM | POA: Diagnosis not present

## 2022-02-18 DIAGNOSIS — I10 Essential (primary) hypertension: Secondary | ICD-10-CM | POA: Diagnosis not present

## 2022-04-08 DIAGNOSIS — M25511 Pain in right shoulder: Secondary | ICD-10-CM | POA: Diagnosis not present

## 2022-04-09 DIAGNOSIS — M25611 Stiffness of right shoulder, not elsewhere classified: Secondary | ICD-10-CM | POA: Diagnosis not present

## 2022-04-16 DIAGNOSIS — M25611 Stiffness of right shoulder, not elsewhere classified: Secondary | ICD-10-CM | POA: Diagnosis not present

## 2022-04-22 DIAGNOSIS — M25611 Stiffness of right shoulder, not elsewhere classified: Secondary | ICD-10-CM | POA: Diagnosis not present

## 2022-04-24 DIAGNOSIS — M25611 Stiffness of right shoulder, not elsewhere classified: Secondary | ICD-10-CM | POA: Diagnosis not present

## 2022-04-28 DIAGNOSIS — M25611 Stiffness of right shoulder, not elsewhere classified: Secondary | ICD-10-CM | POA: Diagnosis not present

## 2022-04-30 DIAGNOSIS — M25611 Stiffness of right shoulder, not elsewhere classified: Secondary | ICD-10-CM | POA: Diagnosis not present

## 2022-05-05 DIAGNOSIS — M25611 Stiffness of right shoulder, not elsewhere classified: Secondary | ICD-10-CM | POA: Diagnosis not present

## 2022-05-07 DIAGNOSIS — M25611 Stiffness of right shoulder, not elsewhere classified: Secondary | ICD-10-CM | POA: Diagnosis not present

## 2022-05-13 DIAGNOSIS — M25611 Stiffness of right shoulder, not elsewhere classified: Secondary | ICD-10-CM | POA: Diagnosis not present

## 2022-05-15 DIAGNOSIS — M25611 Stiffness of right shoulder, not elsewhere classified: Secondary | ICD-10-CM | POA: Diagnosis not present

## 2022-05-18 DIAGNOSIS — M25611 Stiffness of right shoulder, not elsewhere classified: Secondary | ICD-10-CM | POA: Diagnosis not present

## 2022-05-20 DIAGNOSIS — M25611 Stiffness of right shoulder, not elsewhere classified: Secondary | ICD-10-CM | POA: Diagnosis not present

## 2022-06-01 DIAGNOSIS — M25511 Pain in right shoulder: Secondary | ICD-10-CM | POA: Diagnosis not present

## 2022-06-02 DIAGNOSIS — H5203 Hypermetropia, bilateral: Secondary | ICD-10-CM | POA: Diagnosis not present

## 2022-06-02 DIAGNOSIS — H25013 Cortical age-related cataract, bilateral: Secondary | ICD-10-CM | POA: Diagnosis not present

## 2022-06-02 DIAGNOSIS — E119 Type 2 diabetes mellitus without complications: Secondary | ICD-10-CM | POA: Diagnosis not present

## 2022-06-04 ENCOUNTER — Other Ambulatory Visit: Payer: Self-pay | Admitting: Orthopedic Surgery

## 2022-06-04 DIAGNOSIS — Z96611 Presence of right artificial shoulder joint: Secondary | ICD-10-CM

## 2022-06-04 DIAGNOSIS — M25511 Pain in right shoulder: Secondary | ICD-10-CM

## 2022-06-11 ENCOUNTER — Inpatient Hospital Stay: Admission: RE | Admit: 2022-06-11 | Payer: Self-pay | Source: Ambulatory Visit

## 2022-06-11 ENCOUNTER — Other Ambulatory Visit: Payer: Self-pay

## 2022-06-17 ENCOUNTER — Ambulatory Visit
Admission: RE | Admit: 2022-06-17 | Discharge: 2022-06-17 | Disposition: A | Discharge status: Home / Self Care | Payer: Self-pay | Source: Ambulatory Visit | Attending: Orthopedic Surgery | Admitting: Orthopedic Surgery

## 2022-06-17 ENCOUNTER — Ambulatory Visit
Admission: RE | Admit: 2022-06-17 | Discharge: 2022-06-17 | Disposition: A | Discharge status: Home / Self Care | Payer: PPO | Source: Ambulatory Visit | Attending: Orthopedic Surgery | Admitting: Orthopedic Surgery

## 2022-06-17 DIAGNOSIS — M25511 Pain in right shoulder: Secondary | ICD-10-CM | POA: Diagnosis not present

## 2022-06-17 DIAGNOSIS — Z89231 Acquired absence of right shoulder: Secondary | ICD-10-CM | POA: Diagnosis not present

## 2022-06-17 DIAGNOSIS — M1990 Unspecified osteoarthritis, unspecified site: Secondary | ICD-10-CM | POA: Diagnosis not present

## 2022-06-17 DIAGNOSIS — Z96611 Presence of right artificial shoulder joint: Secondary | ICD-10-CM

## 2022-06-17 MED ORDER — IOPAMIDOL (ISOVUE-M 200) INJECTION 41%
14.0000 mL | Freq: Once | INTRAMUSCULAR | Status: AC
  Administered 2022-06-17: 14 mL via INTRA_ARTICULAR

## 2022-06-20 DIAGNOSIS — W540XXA Bitten by dog, initial encounter: Secondary | ICD-10-CM | POA: Diagnosis not present

## 2022-06-20 DIAGNOSIS — S81852A Open bite, left lower leg, initial encounter: Secondary | ICD-10-CM | POA: Diagnosis not present

## 2022-06-29 DIAGNOSIS — M25511 Pain in right shoulder: Secondary | ICD-10-CM | POA: Diagnosis not present

## 2022-07-01 DIAGNOSIS — I350 Nonrheumatic aortic (valve) stenosis: Secondary | ICD-10-CM | POA: Diagnosis not present

## 2022-07-01 DIAGNOSIS — I2510 Atherosclerotic heart disease of native coronary artery without angina pectoris: Secondary | ICD-10-CM | POA: Diagnosis not present

## 2022-07-01 DIAGNOSIS — E782 Mixed hyperlipidemia: Secondary | ICD-10-CM | POA: Diagnosis not present

## 2022-07-01 DIAGNOSIS — F320 Major depressive disorder, single episode, mild: Secondary | ICD-10-CM | POA: Diagnosis not present

## 2022-07-01 DIAGNOSIS — M0600 Rheumatoid arthritis without rheumatoid factor, unspecified site: Secondary | ICD-10-CM | POA: Diagnosis not present

## 2022-07-01 DIAGNOSIS — I25118 Atherosclerotic heart disease of native coronary artery with other forms of angina pectoris: Secondary | ICD-10-CM | POA: Diagnosis not present

## 2022-07-01 DIAGNOSIS — G4733 Obstructive sleep apnea (adult) (pediatric): Secondary | ICD-10-CM | POA: Diagnosis not present

## 2022-07-01 DIAGNOSIS — I10 Essential (primary) hypertension: Secondary | ICD-10-CM | POA: Diagnosis not present

## 2022-07-01 DIAGNOSIS — Z794 Long term (current) use of insulin: Secondary | ICD-10-CM | POA: Diagnosis not present

## 2022-07-01 DIAGNOSIS — E119 Type 2 diabetes mellitus without complications: Secondary | ICD-10-CM | POA: Diagnosis not present

## 2022-07-01 DIAGNOSIS — Z79899 Other long term (current) drug therapy: Secondary | ICD-10-CM | POA: Diagnosis not present

## 2022-09-07 ENCOUNTER — Telehealth: Payer: Self-pay | Admitting: Family Medicine

## 2022-09-07 NOTE — Telephone Encounter (Signed)
Patient has transferred to Dr.Richard Sullivan [facility], see office notes in Epic.

## 2022-10-14 DIAGNOSIS — I2510 Atherosclerotic heart disease of native coronary artery without angina pectoris: Secondary | ICD-10-CM | POA: Diagnosis not present

## 2022-10-14 DIAGNOSIS — M0600 Rheumatoid arthritis without rheumatoid factor, unspecified site: Secondary | ICD-10-CM | POA: Diagnosis not present

## 2022-10-14 DIAGNOSIS — Z0000 Encounter for general adult medical examination without abnormal findings: Secondary | ICD-10-CM | POA: Diagnosis not present

## 2022-10-14 DIAGNOSIS — Z23 Encounter for immunization: Secondary | ICD-10-CM | POA: Diagnosis not present

## 2022-10-14 DIAGNOSIS — I10 Essential (primary) hypertension: Secondary | ICD-10-CM | POA: Diagnosis not present

## 2022-10-14 DIAGNOSIS — E119 Type 2 diabetes mellitus without complications: Secondary | ICD-10-CM | POA: Diagnosis not present

## 2022-10-14 DIAGNOSIS — E782 Mixed hyperlipidemia: Secondary | ICD-10-CM | POA: Diagnosis not present

## 2022-10-14 DIAGNOSIS — Z79899 Other long term (current) drug therapy: Secondary | ICD-10-CM | POA: Diagnosis not present

## 2022-10-14 DIAGNOSIS — G4733 Obstructive sleep apnea (adult) (pediatric): Secondary | ICD-10-CM | POA: Diagnosis not present

## 2022-10-14 DIAGNOSIS — I25118 Atherosclerotic heart disease of native coronary artery with other forms of angina pectoris: Secondary | ICD-10-CM | POA: Diagnosis not present

## 2022-10-14 DIAGNOSIS — Z1331 Encounter for screening for depression: Secondary | ICD-10-CM | POA: Diagnosis not present

## 2022-10-14 DIAGNOSIS — I350 Nonrheumatic aortic (valve) stenosis: Secondary | ICD-10-CM | POA: Diagnosis not present

## 2022-10-14 DIAGNOSIS — Z794 Long term (current) use of insulin: Secondary | ICD-10-CM | POA: Diagnosis not present

## 2022-11-10 ENCOUNTER — Other Ambulatory Visit: Payer: Self-pay | Admitting: Physician Assistant

## 2023-09-16 ENCOUNTER — Other Ambulatory Visit: Payer: Self-pay | Admitting: Orthopedic Surgery

## 2023-09-28 ENCOUNTER — Encounter
Admission: RE | Admit: 2023-09-28 | Discharge: 2023-09-28 | Disposition: A | Discharge status: Home / Self Care | Source: Ambulatory Visit | Attending: Orthopedic Surgery

## 2023-09-28 ENCOUNTER — Other Ambulatory Visit: Payer: Self-pay

## 2023-09-28 VITALS — BP 120/74 | HR 66 | Resp 16 | Ht 70.0 in | Wt 189.0 lb

## 2023-09-28 DIAGNOSIS — E1159 Type 2 diabetes mellitus with other circulatory complications: Secondary | ICD-10-CM | POA: Diagnosis not present

## 2023-09-28 DIAGNOSIS — Z01818 Encounter for other preprocedural examination: Secondary | ICD-10-CM | POA: Insufficient documentation

## 2023-09-28 HISTORY — DX: Essential (primary) hypertension: I10

## 2023-09-28 HISTORY — DX: Other ill-defined heart diseases: I51.89

## 2023-09-28 HISTORY — DX: Nonrheumatic aortic (valve) stenosis: I35.0

## 2023-09-28 HISTORY — DX: Type 2 diabetes mellitus without complications: E11.9

## 2023-09-28 HISTORY — DX: Hyperlipidemia, unspecified: E78.5

## 2023-09-28 HISTORY — DX: Acute myocardial infarction, unspecified: I21.9

## 2023-09-28 LAB — URINALYSIS, ROUTINE W REFLEX MICROSCOPIC
Bilirubin Urine: NEGATIVE
Glucose, UA: NEGATIVE mg/dL
Hgb urine dipstick: NEGATIVE
Ketones, ur: NEGATIVE mg/dL
Leukocytes,Ua: NEGATIVE
Nitrite: NEGATIVE
Protein, ur: NEGATIVE mg/dL
Specific Gravity, Urine: 1.025 (ref 1.005–1.030)
pH: 5 (ref 5.0–8.0)

## 2023-09-28 LAB — COMPREHENSIVE METABOLIC PANEL WITH GFR
ALT: 34 U/L (ref 0–44)
AST: 43 U/L — ABNORMAL HIGH (ref 15–41)
Albumin: 4.5 g/dL (ref 3.5–5.0)
Alkaline Phosphatase: 49 U/L (ref 38–126)
Anion gap: 13 (ref 5–15)
BUN: 30 mg/dL — ABNORMAL HIGH (ref 8–23)
CO2: 20 mmol/L — ABNORMAL LOW (ref 22–32)
Calcium: 9.7 mg/dL (ref 8.9–10.3)
Chloride: 102 mmol/L (ref 98–111)
Creatinine, Ser: 1.07 mg/dL (ref 0.61–1.24)
GFR, Estimated: >60 mL/min (ref >60)
Glucose, Bld: 122 mg/dL — ABNORMAL HIGH (ref 70–99)
Potassium: 5.1 mmol/L (ref 3.5–5.1)
Sodium: 135 mmol/L (ref 135–145)
Total Bilirubin: 0.8 mg/dL (ref 0.0–1.2)
Total Protein: 7.8 g/dL (ref 6.5–8.1)

## 2023-09-28 LAB — CBC WITH DIFFERENTIAL/PLATELET
Abs Immature Granulocytes: 0.03 K/uL (ref 0.00–0.07)
Basophils Absolute: 0 K/uL (ref 0.0–0.1)
Basophils Relative: 0 %
Eosinophils Absolute: 0.2 K/uL (ref 0.0–0.5)
Eosinophils Relative: 3 %
HCT: 37.4 % — ABNORMAL LOW (ref 39.0–52.0)
Hemoglobin: 12.6 g/dL — ABNORMAL LOW (ref 13.0–17.0)
Immature Granulocytes: 1 %
Lymphocytes Relative: 27 %
Lymphs Abs: 1.4 K/uL (ref 0.7–4.0)
MCH: 29.3 pg (ref 26.0–34.0)
MCHC: 33.7 g/dL (ref 30.0–36.0)
MCV: 87 fL (ref 80.0–100.0)
Monocytes Absolute: 0.5 K/uL (ref 0.1–1.0)
Monocytes Relative: 9 %
Neutro Abs: 3 K/uL (ref 1.7–7.7)
Neutrophils Relative %: 60 %
Platelets: 225 K/uL (ref 150–400)
RBC: 4.3 MIL/uL (ref 4.22–5.81)
RDW: 12.9 % (ref 11.5–15.5)
WBC: 5.1 K/uL (ref 4.0–10.5)
nRBC: 0 % (ref 0.0–0.2)

## 2023-09-28 LAB — SURGICAL PCR SCREEN
MRSA, PCR: NEGATIVE
Staphylococcus aureus: NEGATIVE

## 2023-09-28 NOTE — Patient Instructions (Addendum)
 Your procedure is scheduled on: Monday 10/11/23 Report to the Registration Desk on the 1st floor of the [facility]. To find out your arrival time, please call [phone] between 1PM - 3PM on: Friday 10/08/23 If your arrival time is 6:00 am, do not arrive before that time as the [facility] entrance doors do not open until 6:00 am.  REMEMBER: Instructions that are not followed completely may result in serious medical risk, up to and including death; or upon the discretion of your surgeon and anesthesiologist your surgery may need to be rescheduled.  Do not eat food after midnight the night before surgery.  No gum chewing or hard candies.  You may however, drink CLEAR liquids up to 2 hours before you are scheduled to arrive for your surgery. Do not drink anything within 2 hours of your scheduled arrival time.  Clear liquids include: - water    **Type 1 and Type 2 diabetics should only drink water .**  In addition, your doctor has ordered for you to drink the provided:   Gatorade G2 Drinking this carbohydrate drink up to two hours before surgery helps to reduce insulin  resistance and improve patient outcomes. Please complete drinking 2 hours before scheduled arrival time.  One week prior to surgery: Stop Anti-inflammatories (NSAIDS) such as Advil, Aleve , Ibuprofen, Motrin, Naproxen , Naprosyn  and Aspirin  based products such as Excedrin, Goody's Powder, BC Powder.  You may however, continue to take Tylenol  if needed for pain up until the day of surgery  Stop ANY OVER THE COUNTER supplements until after surgery.  **Follow guidelines for insulin  and diabetes medications.** Trulicity  hold for 7 days, last dose 10/01/23. Metformin  hold for 2 days, last dose Friday 10/08/23  **Follow recommendations regarding stopping blood thinners.** Ask Dr Huey about holding or decreasing your Aspirin  dose.  Continue taking all of your other prescription medications up until the day of surgery.  ON  THE DAY OF SURGERY ONLY TAKE THESE MEDICATIONS WITH SIPS OF WATER :  Amlodipine  Omeprazole  Rosuvastatin  Sertraline   No Alcohol for 24 hours before or after surgery.  No Smoking including e-cigarettes for 24 hours before surgery.  No chewable tobacco products for at least 6 hours before surgery.  No nicotine patches on the day of surgery.  Do not use any recreational drugs for at least a week (preferably 2 weeks) before your surgery.  Please be advised that the combination of cocaine and anesthesia may have negative outcomes, up to and including death. If you test positive for cocaine, your surgery will be cancelled.  On the morning of surgery brush your teeth with toothpaste and water , you may rinse your mouth with mouthwash if you wish. Do not swallow any toothpaste or mouthwash.  Use CHG Soap or wipes as directed on instruction sheet.  Do not shave body hair from the neck down 48 hours before surgery. You can shave your face head or neck if you wish.  Do not wear lotions, powders, or perfumes/cologne on the day of surgery.  Wear comfortable clothing (specific to your surgery type) to the hospital.  Do not wear jewelry, make-up, hairpins, clips or nail polish.  For welded (permanent) jewelry: bracelets, anklets, waist bands, etc.  Please have this removed prior to surgery.  If it is not removed, there is a chance that hospital personnel will need to cut it off on the day of surgery.  Contact lenses, hearing aids and dentures may not be worn into surgery. Bring a case for your glasses and hearing aids  Do not bring valuables to the hospital. [facility] is not responsible for any missing/lost belongings or valuables.   Notify your doctor if there is any change in your medical condition (cold, fever, infection).  After surgery, you can help prevent lung complications by doing breathing exercises.  Take deep breaths and cough every 1-2 hours. Your doctor may order a device  called an Incentive Spirometer to help you take deep breaths.  If you are being admitted to the hospital overnight, leave your suitcase in the car. After surgery it may be brought to your room.  In case of increased patient census, it may be necessary for you, the patient, to continue your postoperative care in the Same Day Surgery department.  Please call the Pre-admissions Testing Dept. at [phone] if you have any questions about these instructions.  Surgery Visitation Policy:  Patients having surgery or a procedure may have two visitors.  Children under the age of 19 must have an adult with them who is not the patient.  Inpatient Visitation:    Visiting hours are 7 a.m. to 8 p.m. Up to four visitors are allowed at one time in a patient room. The visitors may rotate out with other people during the day.  One visitor age 61 or older may stay with the patient overnight and must be in the room by 8 p.m.   Merchandiser, retail to address health-related social needs:  [email or web address]    Pre-operative 5 CHG Bath Instructions   You can play a key role in reducing the risk of infection after surgery. Your skin needs to be as free of germs as possible. You can reduce the number of germs on your skin by washing with CHG (chlorhexidine  gluconate) soap before surgery. CHG is an antiseptic soap that kills germs and continues to kill germs even after washing.   DO NOT use if you have an allergy to chlorhexidine /CHG or antibacterial soaps. If your skin becomes reddened or irritated, stop using the CHG and notify one of our RNs at [phone].   Please shower with the CHG soap starting 4 days before surgery using the following schedule:   Thursday 10/07/23 -  Monday 10/11/23    Please keep in mind the following:  DO NOT shave, including legs and underarms, starting the day of your first shower.   You may shave your face at any point before/day of surgery.  Place  clean sheets on your bed the day you start using CHG soap. Use a clean washcloth (not used since being washed) for each shower. DO NOT sleep with pets once you start using the CHG.   CHG Shower Instructions:  If you choose to wash your hair and private area, wash first with your normal shampoo/soap.  After you use shampoo/soap, rinse your hair and body thoroughly to remove shampoo/soap residue.  Turn the water  OFF and apply about 3 tablespoons (45 ml) of CHG soap to a CLEAN washcloth.  Apply CHG soap ONLY FROM YOUR NECK DOWN TO YOUR TOES (washing for 3-5 minutes)  DO NOT use CHG soap on face, private areas, open wounds, or sores.  Pay special attention to the area where your surgery is being performed.  If you are having back surgery, having someone wash your back for you may be helpful. Wait 2 minutes after CHG soap is applied, then you may rinse off the CHG soap.  Pat dry with a clean towel  Put on clean clothes/pajamas   If you  choose to wear lotion, please use ONLY the CHG-compatible lotions on the back of this paper.     Additional instructions for the day of surgery: DO NOT APPLY any lotions, deodorants, cologne, or perfumes.   Put on clean/comfortable clothes.  Brush your teeth.  Ask your nurse before applying any prescription medications to the skin.      CHG Compatible Lotions   Aveeno Moisturizing lotion  Cetaphil Moisturizing Cream  Cetaphil Moisturizing Lotion  Clairol Herbal Essence Moisturizing Lotion, Dry Skin  Clairol Herbal Essence Moisturizing Lotion, Extra Dry Skin  Clairol Herbal Essence Moisturizing Lotion, Normal Skin  Curel Age Defying Therapeutic Moisturizing Lotion with Alpha Hydroxy  Curel Extreme Care Body Lotion  Curel Soothing Hands Moisturizing Hand Lotion  Curel Therapeutic Moisturizing Cream, Fragrance-Free  Curel Therapeutic Moisturizing Lotion, Fragrance-Free  Curel Therapeutic Moisturizing Lotion, Original Formula  Eucerin Daily Replenishing  Lotion  Eucerin Dry Skin Therapy Plus Alpha Hydroxy Crme  Eucerin Dry Skin Therapy Plus Alpha Hydroxy Lotion  Eucerin Original Crme  Eucerin Original Lotion  Eucerin Plus Crme Eucerin Plus Lotion  Eucerin TriLipid Replenishing Lotion  Keri Anti-Bacterial Hand Lotion  Keri Deep Conditioning Original Lotion Dry Skin Formula Softly Scented  Keri Deep Conditioning Original Lotion, Fragrance Free Sensitive Skin Formula  Keri Lotion Fast Absorbing Fragrance Free Sensitive Skin Formula  Keri Lotion Fast Absorbing Softly Scented Dry Skin Formula  Keri Original Lotion  Keri Skin Renewal Lotion Keri Silky Smooth Lotion  Keri Silky Smooth Sensitive Skin Lotion  Nivea Body Creamy Conditioning Oil  Nivea Body Extra Enriched Lotion  Nivea Body Original Lotion  Nivea Body Sheer Moisturizing Lotion Nivea Crme  Nivea Skin Firming Lotion  NutraDerm 30 Skin Lotion  NutraDerm Skin Lotion  NutraDerm Therapeutic Skin Cream  NutraDerm Therapeutic Skin Lotion  ProShield Protective Hand Cream  Provon moisturizing lotion  How to Use an Incentive Spirometer  An incentive spirometer is a tool that measures how well you are filling your lungs with each breath. Learning to take long, deep breaths using this tool can help you keep your lungs clear and active. This may help to reverse or lessen your chance of developing breathing (pulmonary) problems, especially infection. You may be asked to use a spirometer: After a surgery. If you have a lung problem or a history of smoking. After a long period of time when you have been unable to move or be active. If the spirometer includes an indicator to show the highest number that you have reached, your health care provider or respiratory therapist will help you set a goal. Keep a log of your progress as told by your health care provider. What are the risks? Breathing too quickly may cause dizziness or cause you to pass out. Take your time so you do not get dizzy or  light-headed. If you are in pain, you may need to take pain medicine before doing incentive spirometry. It is harder to take a deep breath if you are having pain. How to use your incentive spirometer  Sit up on the edge of your bed or on a chair. Hold the incentive spirometer so that it is in an upright position. Before you use the spirometer, breathe out normally. Place the mouthpiece in your mouth. Make sure your lips are closed tightly around it. Breathe in slowly and as deeply as you can through your mouth, causing the piston or the ball to rise toward the top of the chamber. Hold your breath for 3-5 seconds, or for as  long as possible. If the spirometer includes a coach indicator, use this to guide you in breathing. Slow down your breathing if the indicator goes above the marked areas. Remove the mouthpiece from your mouth and breathe out normally. The piston or ball will return to the bottom of the chamber. Rest for a few seconds, then repeat the steps 10 or more times. Take your time and take a few normal breaths between deep breaths so that you do not get dizzy or light-headed. Do this every 1-2 hours when you are awake. If the spirometer includes a goal marker to show the highest number you have reached (best effort), use this as a goal to work toward during each repetition. After each set of 10 deep breaths, cough a few times. This will help to make sure that your lungs are clear. If you have an incision on your chest or abdomen from surgery, place a pillow or a rolled-up towel firmly against the incision when you cough. This can help to reduce pain while taking deep breaths and coughing. General tips When you are able to get out of bed: Walk around often. Continue to take deep breaths and cough in order to clear your lungs. Keep using the incentive spirometer until your health care provider says it is okay to stop using it. If you have been in the hospital, you may be told to keep  using the spirometer at home. Contact a health care provider if: You are having difficulty using the spirometer. You have trouble using the spirometer as often as instructed. Your pain medicine is not giving enough relief for you to use the spirometer as told. You have a fever. Get help right away if: You develop shortness of breath. You develop a cough with bloody mucus from the lungs. You have fluid or blood coming from an incision site after you cough. Summary An incentive spirometer is a tool that can help you learn to take long, deep breaths to keep your lungs clear and active. You may be asked to use a spirometer after a surgery, if you have a lung problem or a history of smoking, or if you have been inactive for a long period of time. Use your incentive spirometer as instructed every 1-2 hours while you are awake. If you have an incision on your chest or abdomen, place a pillow or a rolled-up towel firmly against your incision when you cough. This will help to reduce pain. Get help right away if you have shortness of breath, you cough up bloody mucus, or blood comes from your incision when you cough. This information is not intended to replace advice given to you by your health care provider. Make sure you discuss any questions you have with your health care provider. Document Revised: 03/20/2019 Document Reviewed: 03/20/2019 Elsevier Patient Education  2023 [employer].

## 2023-09-28 NOTE — Progress Notes (Addendum)
 New Patient Visit    Chief Complaint: Chief Complaint  Patient presents with  . Follow-up    POC    Date of Service: 09/29/2023 Date of Birth: April 10, 1955 PCP: Bertrum Charlie Raring, MD  History of Present Illness: Mr. Giller is a 68 y.o.male patient who presents to establish care. PMH significant for mild aortic stenosis, CAD, hyperlipidemia, hypertension, angina, atherosclerosis, OSA, type 2 diabetes, chest pain.  Patient has known intervention with PCI and stent to LAD and circumflex in 2016 most recent catheter in 2028 with 60% LAD stenosis proximally and stent diffuse disease and small RCA hypertension lipidemia obstructive sleep apnea diabetes has been doing reasonably well no significant symptoms preserved left ventricular function on echo previous functional study was unremarkable in 2023 asymptomatic currently preop for knee surgery  Patient is scheduled for right knee surgery. Patient has been taking aspirin  325 mg daily. Will lower aspirin  to 81 mg daily. Start zetia. Pre-operative clearance was provided for right knee surgery, mild risk.      Past Medical and Surgical History  Past Medical History Past Medical History:  Diagnosis Date  . Arthritis   . Bilateral carotid artery stenosis 02/27/2021  . CAD (coronary artery disease)   . COVID-19 06/2020  . Depression   . Diabetes mellitus type 2, uncomplicated (CMS/HHS-HCC)   . GERD (gastroesophageal reflux disease)   . Hand pain   . Heart disease   . Hyperlipidemia   . Hypertension   . OSA (obstructive sleep apnea) 08/10/2018    Past Surgical History He has a past surgical history that includes Back surgery; Coronary angioplasty; Fusion Arthrodesis Ankle W/Arthroscopy (Left); wrist surgery; neck surgery; and Total shoulder replacement (Right).   Medications and Allergies  Current Medications Current Outpatient Medications  Medication Sig Dispense Refill  . amLODIPine  (NORVASC ) 5 MG tablet Take 1 tablet (5 mg total) by  mouth once daily 90 tablet 3  . aspirin  325 MG tablet Take 325 mg by mouth once daily       . BD ULTRA-FINE NANO PEN NEEDLE 32 gauge x 5/32 Ndle 1 each by Other route as directed 100 each 6  . blood-glucose sensor (FREESTYLE LIBRE 3 PLUS SENSOR) Devi Use 1 each every 15 (fifteen) days DX E11.9 6 each 1  . dulaglutide  (TRULICITY ) 0.75 mg/0.5 mL subcutaneous pen injector Inject 0.5 mLs (0.75 mg total) subcutaneously every 7 (seven) days 9 mL 3  . flash glucose sensor (FREESTYLE LIBRE 2 SENSOR) Kit Inject 1 kit into the muscle as directed 6 each 6  . insulin  DEGLUDEC (TRESIBA  FLEXTOUCH) pen injector (concentration 100 units/mL) Inject 27 Units subcutaneously once daily Take 30-32 units daily. 28.8 mL 3  . lisinopriL  (ZESTRIL ) 20 MG tablet Take 1 tablet (20 mg total) by mouth once daily 90 tablet 3  . metFORMIN  (GLUCOPHAGE ) 1000 MG tablet Take 1 tablet (1,000 mg total) by mouth 2 (two) times daily 180 tablet 3  . multivitamin tablet Take 1 tablet by mouth once daily       . omeprazole  (PRILOSEC) 20 MG DR capsule Take 1 capsule (20 mg total) by mouth once daily 90 capsule 3  . rosuvastatin  (CRESTOR ) 40 MG tablet TAKE 1 TABLET BY MOUTH ONCE EVERY EVENING 90 tablet 3  . sertraline  (ZOLOFT ) 50 MG tablet Take 1 tablet (50 mg total) by mouth once daily 90 tablet 1  . ezetimibe (ZETIA) 10 mg tablet Take 1 tablet (10 mg total) by mouth once daily 90 tablet 0   No current facility-administered  medications for this visit.    Allergies Prednisone  Social and Family History  Social History  reports that he has quit smoking. His smoking use included cigarettes. He has never used smokeless tobacco. He reports that he does not drink alcohol and does not use drugs.  Family History family history is not on file.   Review of Systems   Pertinent positives and negatives are mentioned above in HPI and all other systems are negative.  Physical Examination   Vitals:BP 120/62   Pulse 69   Ht 177.8 cm (5'  10)   Wt 84.8 kg (187 lb)   SpO2 96%   BMI 26.83 kg/m  Ht:177.8 cm (5' 10) Wt:84.8 kg (187 lb) ADJ:Anib surface area is 2.05 meters squared. Body mass index is 26.83 kg/m.  HEENT: Pupils equally reactive to light and accomodation  Neck: Supple without thyromegaly, carotid pulses 2+ Lungs: clear to auscultation bilaterally; no wheezes, rales, rhonchi Heart: Regular rate and rhythm.  No gallops, murmurs or rub Abdomen: soft nontender, nondistended, with normal bowel sounds Extremities: no cyanosis, clubbing, or edema Peripheral Pulses: 2+ in all extremities, 2+ femoral pulses bilaterally Neurologic: Alert and oriented X3; speech intact; face symmetrical; moves all extremities well   Cardiovascular Studies:    Echocardiogram 2D complete:  NM Myocardial Perfusion SPECT multiple (stress and rest):  Cardiac Catheterization:   Holter:  Cardiac CT Scan:  Cardiac MRI:    Assessment   68 y.o. male with  1. Preoperative clearance   2. Coronary artery disease involving native coronary artery of native heart, unspecified whether angina present   3. Benign essential HTN   4. Hyperlipidemia, mixed   5. Mild aortic stenosis   6. S/P coronary artery stent placement   7. OSA (obstructive sleep apnea)   8. Type 2 diabetes mellitus without complication, unspecified whether long term insulin  use (CMS/HHS-HCC)   9. Gastroesophageal reflux disease, unspecified whether esophagitis present    Plan   Pre-operative clearance, clearance provided for right knee surgery, mild risk, hold aspirin  5 days prior to surgery  CAD, continue rosuvastatin , lisinopril , amlodipine , aspirin  asymptomatic from anginal standpoint with known coronary disease continue current therapy Hypertension, today's BP was 120/62, reasonably controlled, continue lisinopril , amlodipine  pressure goal less than 130/80 Hyperlipidemia, continue rosuvastatin  therapy for lipid management, start zetia LDL goal less than 55 OSA,  recommend sleep study, CPAP use if indicated, weight loss Diabetes, continue metformin  GERD, continue omeprazole  therapy for reflux type symptoms  Have the patient follow-up in 1 year     Return in about 1 year (around 09/28/2024).  This note is partially written by Leita Ellen, in the presence of and acting as the scribe of Dr. Cara Lovelace.  Leita Ellen  I have reviewed, edited and added to the note to reflect my best personal medical judgment.  Attestation Statement:   I personally performed the service. (TP)  DWAYNE JONETTA LOVELACE, MD  [facility] [place], [place] Ph:  [phone] Fax:  [phone] This note was generated in part with voice recognition software, Dragon.  I apologize for any typographical errors that were not detected and corrected from this process.  They are unintentional.

## 2023-10-06 ENCOUNTER — Encounter: Payer: Self-pay | Admitting: Orthopedic Surgery

## 2023-10-06 NOTE — Progress Notes (Signed)
 Perioperative / Anesthesia Services  Pre-Admission Testing Clinical Review / Pre-Operative Anesthesia Consult  Date: 10/06/23  PATIENT DEMOGRAPHICS: Name: Samuel Gentry DOB: Mar 04, 1955 MRN:   [id number]  Note: Available PAT nursing documentation and vital signs have been reviewed. Clinical nursing staff has updated patient's PMH/PSHx, current medication list, and drug allergies/intolerances to ensure complete and comprehensive history available to assist care teams in MDM as it pertains to the aforementioned surgical procedure and anticipated anesthetic course. Extensive review of available clinical information personally performed. [place] PMH and PSHx updated with any diagnoses/procedures that  may have been inadvertently omitted during his intake with the pre-admission testing department's nursing staff.  PLANNED SURGICAL PROCEDURE(S):   Case: [id number] Date/Time: 10/11/23 0715   Procedure: ARTHROPLASTY, KNEE, TOTAL (Right: Knee)   Anesthesia type: Choice   Diagnosis: Primary osteoarthritis of right knee [M17.11]   Pre-op diagnosis: Primary osteoarthritis of right knee M17.11   Location: [facility] OR ROOM 02 / [facility]   Surgeons: Lorelle Hussar, MD        CLINICAL DISCUSSION: Samuel Gentry is a 68 y.o. male who is submitted for pre-surgical anesthesia review and clearance prior to him undergoing the above procedure. Patient is a Former Smoker (16 pack years; quit 01/1982). Pertinent PMH includes: CAD, aortic stenosis, diastolic dysfunction, HTN, HLD, T2DM, GERD (on daily PPI), OSAH (no nocturnal PAP therapy), ventral hernia, nephrolithiasis, OA, lumbar DDD, depression, anxiety  Patient is followed by cardiology Philippe, MD). He was last seen in the cardiology clinic on 09/29/2023; notes reviewed. At the time of his clinic visit, patient doing well overall from a cardiovascular perspective. Patient denied any chest pain, shortness of breath, PND, orthopnea,  palpitations, significant peripheral edema, weakness, fatigue, vertiginous symptoms, or presyncope/syncope. Patient with a past medical history significant for cardiovascular diagnoses. Documented physical exam was grossly benign, providing no evidence of acute exacerbation and/or decompensation of the patient's known cardiovascular conditions.  Patient underwent diagnostic LEFT heart catheterization here at [facility] on 03/03/2001.  Study revealed multivessel CAD: 20% ostial-proximal RCA, 50% proximal-mid LCx, 50% distal LCx, 50% mid LAD, 50% D1, 50% D2, and 70% D3.  Interventional cardiology made the decision to defer intervention at that time opting for secondary prevention through medical management.  Patient seen at [facility] on 11/19/2004 for complaints of unstable angina.  Ultimately underwent repeat diagnostic LEFT heart catheterization, again revealing multivessel CAD; 20-30% proximal LAD, 95% proximal LAD just after the first septal, 70% mid LAD, 80% proximal LCx, 60-70% proximal OM1, and 50% distal LCx. PCI was performed placing a 3.0 x 18 mm Cypher DES x 1 to the proximal LAD and a 3.5 x 13 mm Cypher DES to the distal LAD.  Procedure yielded excellent angiographic result.  Patient with significant circumflex disease.  Plans were for staged PCI.  Patient underwent planned staged PCI on 12/01/2004.  PCI was performed to the 60-70% stenosis in the proximal OM1 (3.0 x 18 mm Cypher DES) and to the 80% lesion in the proximal LCx (3.0 x 18 mm Cypher DES). Again, procedure yielded excellent angiographic result and TIMI-3 flow.  Most recent TTE performed on 02/20/2021 revealed a normal left ventricular systolic function with an EF of >55%. There were no regional wall motion abnormalities. Left ventricular diastolic Doppler parameters consistent with abnormal relaxation (G1DD). Right ventricular size and function normal.  RVSP = 27.6 mmHg. Aortic  valve mildly stenotic with a mean transvalvular pressure gradient of  9.5 mmHg; AVA (VTI) = 1.4 cm.  There was trivial to mild paravalvular regurgitation. All transvalvular gradients were noted to be normal providing no evidence of hemodynamically significant valvular stenosis. Aorta normal in size with no evidence of ectasia or aneurysmal dilatation.  Most recent myocardial perfusion imaging study was performed on 02/20/2021 revealing a low normal left ventricular systolic function with an EF of 52%. There were no regional wall motion abnormalities.  No artifact or left ventricular cavity size enlargement appreciated on review of imaging. SPECT images demonstrated no evidence of stress-induced myocardial ischemia or arrhythmia; no scintigraphic evidence of scar.  TID ratio = 1.06 (normal range </= 1.2). Study determined to be normal and low risk.  Blood pressure well controlled at 120/60 today mmHg on currently prescribed CCB (amlodipine ) and ACEi (lisinopril ) therapies.  Patient is on rosuvastatin  for his HLD diagnosis and ASCVD prevention.  T2DM well-controlled on currently prescribed regimen; last HgbA1c was 6.6% when checked on 06/16/2023. He does  have an OSAH diagnosis, however does not require the use of nocturnal PAP therapy.  No changes were made to his medication regimen during his visit with cardiology.  Patient scheduled to follow-up with outpatient cardiology in 12 months or sooner if needed.  Samuel Gentry is scheduled for an elective ARTHROPLASTY, KNEE, TOTAL (Right: Knee) on 10/11/2023 with Dr. Arthea Sheer, MD. Given patient's past medical history significant for cardiovascular diagnoses, presurgical cardiac clearance was sought by the PAT team. Per cardiology, this patient is optimized for surgery and may proceed with the planned procedural course with a LOW risk of significant perioperative cardiovascular complications.  In review of the patient's chart, it is noted that he is on  daily oral antithrombotic therapy. He has been instructed on recommendations for holding his ASA 325 mg for 5 days prior to his procedure with plans to restart as soon as postoperative bleeding risk felt to be minimized by his primary attending surgeon. The patient has been instructed that his last dose should be on 10/05/2023.  Patient denies previous perioperative complications with anesthesia in the past. In review his EMR, it is noted that patient underwent a general anesthetic course at [facility] ([facility]) in 04/2020 without documented complications.   MOST RECENT VITAL SIGNS:    09/28/2023    8:24 AM 09/30/2021   10:39 AM 09/02/2021    9:46 AM  Vitals with BMI  Height 5' 10 5' 10   Weight 189 lbs 193 lbs 195 lbs  BMI 27.12 27.69   Systolic 120 120 --  Diastolic 74 64 --  Pulse 66 67    PROVIDERS/SPECIALISTS: NOTE: Primary physician provider listed below. Patient may have been seen by APP or partner within same practice.   PROVIDER ROLE / SPECIALTY LAST SHERLEAN Sheer Arthea, MD Orthopedics (Surgeon) 09/24/2023  Bertrum Charlie CROME, MD Primary Care Provider 06/16/2023  Florencio Shine, MD Cardiology 09/29/2023   ALLERGIES: Allergies  Allergen Reactions   Prednisone Other (See Comments)    Makes him very mean    CURRENT HOME MEDICATIONS: No current facility-administered medications for this encounter.    amLODipine  (NORVASC ) 5 MG tablet   aspirin  325 MG tablet   Dulaglutide  (TRULICITY ) 0.75 MG/0.5ML SOPN   insulin  degludec (TRESIBA  FLEXTOUCH) 100 UNIT/ML FlexTouch Pen   lisinopril  (ZESTRIL ) 20 MG tablet   metFORMIN  (GLUCOPHAGE ) 1000 MG tablet   MULTIPLE VITAMINS PO   rosuvastatin  (CRESTOR ) 40 MG tablet   sertraline  (ZOLOFT ) 50 MG tablet   BD PEN NEEDLE  NANO U/F 32G X 4 MM MISC   blood glucose meter kit and supplies   Continuous Blood Gluc Receiver (FREESTYLE LIBRE 2 READER) DEVI   Continuous Blood Gluc Sensor (FREESTYLE LIBRE 2 SENSOR) MISC   Lancets  (ONETOUCH DELICA PLUS LANCET33G) MISC   omeprazole  (PRILOSEC) 20 MG capsule   ONETOUCH ULTRA test strip   HISTORY: Past Medical History:  Diagnosis Date   Angina pectoris    Anxiety    Aortic atherosclerosis    Aortic stenosis    Arthritis    Coronary atherosclerosis of native coronary artery 11/19/2004   a.) LHC/PCI 11/19/2004: 20-30% pLAD, 95% pLAD just after 1st septal (3.0 x 18 mm Cypher DES), 70% mLAD (3.5 x 13 mm Cypher DES), 80% pLCx, 60-70% oOM1, 50% dLCx --> plans for staged PCI; b.) PCI 12/01/2004: 80% pLCx (3.0 x 18 mm Cypher DES), 60-70% pOM1 (3.0 x 18 mm Cypher DES); c.) LHC 07/12/2018: 60% pLAD, 35% mLAD, 30% o-pLCx, 85% o-pRCA; 75% pRCA, 60% mRCA, 10% m-dLAD - Rx mgmt   DDD (degenerative disc disease), lumbar    Depression    Diastolic dysfunction    Diverticulosis    GERD (gastroesophageal reflux disease)    Hepatic steatosis    History of 2019 novel coronavirus disease (COVID-19) 06/2020   HLD (hyperlipidemia)    HTN (hypertension)    Long-term use of aspirin  therapy (FULL DOSE)    Nephrolithiasis    OSA (obstructive sleep apnea)    a.) no nocturnal PAP therapy   Presence of stent in coronary artery 11/19/2004   a.) TOTAL # stents of as 10/06/2023: 4   Right lower lobe pulmonary nodule    T2DM (type 2 diabetes mellitus) (HCC)    Ventral hernia    Wears glasses    Past Surgical History:  Procedure Laterality Date   ANKLE FUSION Left 01/12/2001   BACK SURGERY  93,97   lumb x2   COLONOSCOPY WITH PROPOFOL  N/A 11/06/2015   Procedure: COLONOSCOPY WITH PROPOFOL ;  Surgeon: Reyes LELON Cota, MD;  Location: [facility];  Service: Endoscopy;  Laterality: N/A;   CORONARY ANGIOPLASTY WITH STENT PLACEMENT Left 11/19/2004   Procedure: CORONARY ANGIOPLASTY WITH STENT PLACEMENT; Location: [facility]; Surgeon: Alm Ferrier, MD   CORONARY ANGIOPLASTY WITH STENT PLACEMENT Left 12/01/2004   Procedure: CORONARY ANGIOPLASTY WITH STENT PLACEMENT  (STAGED PROCEDURE); Location: [facility]; Surgeon: Alm Ferrier, MD   DORSAL COMPARTMENT RELEASE Left 12/29/2013   Procedure: LEFT WRIST RELEASE DORSAL COMPARTMENT (DEQUERVAIN);  Surgeon: Alm DELENA Hummer, MD;  Location: [facility];  Service: Orthopedics;  Laterality: Left;   ESOPHAGOGASTRODUODENOSCOPY (EGD) WITH PROPOFOL  N/A 11/06/2015   Procedure: ESOPHAGOGASTRODUODENOSCOPY (EGD) WITH PROPOFOL ;  Surgeon: Reyes LELON Cota, MD;  Location: [facility];  Service: Endoscopy;  Laterality: N/A;   KNEE SURGERY Right 01/13/1976   LEFT HEART CATH AND CORONARY ANGIOGRAPHY Left 03/03/2001   Procedure: LEFT HEART CATH AND CORONARY ANGIOGRAPHY; Location: [facility]; Surgeon: Samuel Lovelace, MD   LEFT HEART CATH AND CORONARY ANGIOGRAPHY Left 07/12/2018   Procedure: LEFT HEART CATH AND CORONARY ANGIOGRAPHY;  Surgeon: Hester Wolm PARAS, MD;  Location: [facility] INVASIVE [facility] LAB;  Service: Cardiovascular;  Laterality: Left;   NECK SURGERY  01/13/2008   TOTAL SHOULDER ARTHROPLASTY Right 05/02/2020   Procedure: TOTAL SHOULDER ARTHROPLASTY;  Surgeon: Dozier Soulier, MD;  Location: [facility];  Service: Orthopedics;  Laterality: Right;   Family History  Problem Relation Age of Onset   Hyperlipidemia Mother    Diabetes  Father    Prostate cancer Father    Healthy Brother    Colon cancer Neg Hx    Breast cancer Neg Hx    Social History   Tobacco Use   Smoking status: Former    Current packs/day: 0.00    Average packs/day: 2.0 packs/day for 8.0 years (16.0 ttl pk-yrs)    Types: Cigarettes    Start date: 02/08/1974    Quit date: 02/08/1982    Years since quitting: 41.6   Smokeless tobacco: Never  Substance Use Topics   Alcohol use: No   LABS:  Hospital Outpatient Visit on 09/28/2023  Component Date Value Ref Range Status   MRSA, PCR 09/28/2023 NEGATIVE  NEGATIVE Final   Staphylococcus aureus 09/28/2023 NEGATIVE  NEGATIVE Final   Comment: (NOTE) The Xpert SA Assay (FDA  approved for NASAL specimens in patients 20 years of age and older), is one component of a comprehensive surveillance program. It is not intended to diagnose infection nor to guide or monitor treatment. Performed at [facility], [address] [place], [place] [address]    WBC 09/28/2023 5.1  4.0 - 10.5 K/uL Final   RBC 09/28/2023 4.30  4.22 - 5.81 MIL/uL Final   Hemoglobin 09/28/2023 12.6 (L)  13.0 - 17.0 g/dL Final   HCT 90/83/7974 37.4 (L)  39.0 - 52.0 % Final   MCV 09/28/2023 87.0  80.0 - 100.0 fL Final   MCH 09/28/2023 29.3  26.0 - 34.0 pg Final   MCHC 09/28/2023 33.7  30.0 - 36.0 g/dL Final   RDW 90/83/7974 12.9  11.5 - 15.5 % Final   Platelets 09/28/2023 225  150 - 400 K/uL Final   nRBC 09/28/2023 0.0  0.0 - 0.2 % Final   Neutrophils Relative % 09/28/2023 60  % Final   Neutro Abs 09/28/2023 3.0  1.7 - 7.7 K/uL Final   Lymphocytes Relative 09/28/2023 27  % Final   Lymphs Abs 09/28/2023 1.4  0.7 - 4.0 K/uL Final   Monocytes Relative 09/28/2023 9  % Final   Monocytes Absolute 09/28/2023 0.5  0.1 - 1.0 K/uL Final   Eosinophils Relative 09/28/2023 3  % Final   Eosinophils Absolute 09/28/2023 0.2  0.0 - 0.5 K/uL Final   Basophils Relative 09/28/2023 0  % Final   Basophils Absolute 09/28/2023 0.0  0.0 - 0.1 K/uL Final   Immature Granulocytes 09/28/2023 1  % Final   Abs Immature Granulocytes 09/28/2023 0.03  0.00 - 0.07 K/uL Final   Performed at [facility], [address] [place], [place] [address]   Sodium 09/28/2023 135  135 - 145 mmol/L Final   Potassium 09/28/2023 5.1  3.5 - 5.1 mmol/L Final   Chloride 09/28/2023 102  98 - 111 mmol/L Final   CO2 09/28/2023 20 (L)  22 - 32 mmol/L Final   Glucose, Bld 09/28/2023 122 (H)  70 - 99 mg/dL Final   Glucose reference range applies only to samples taken after fasting for at least 8 hours.   BUN 09/28/2023 30 (H)  8 - 23 mg/dL Final   Creatinine, Ser 09/28/2023 1.07  0.61 - 1.24 mg/dL Final   Calcium  09/28/2023  9.7  8.9 - 10.3 mg/dL Final   Total Protein 90/83/7974 7.8  6.5 - 8.1 g/dL Final   Albumin 90/83/7974 4.5  3.5 - 5.0 g/dL Final   AST 90/83/7974 43 (H)  15 - 41 U/L Final   ALT 09/28/2023 34  0 - 44 U/L Final  Alkaline Phosphatase 09/28/2023 49  38 - 126 U/L Final   Total Bilirubin 09/28/2023 0.8  0.0 - 1.2 mg/dL Final   GFR, Estimated 09/28/2023 >60  >60 mL/min Final   Comment: (NOTE) Calculated using the CKD-EPI Creatinine Equation (2021)    Anion gap 09/28/2023 13  5 - 15 Final   Performed at [facility], [address] [place], [place] [address]   Color, Urine 09/28/2023 AMBER (A)  YELLOW Final   BIOCHEMICALS MAY BE AFFECTED BY COLOR   APPearance 09/28/2023 HAZY (A)  CLEAR Final   Specific Gravity, Urine 09/28/2023 1.025  1.005 - 1.030 Final   pH 09/28/2023 5.0  5.0 - 8.0 Final   Glucose, UA 09/28/2023 NEGATIVE  NEGATIVE mg/dL Final   Hgb urine dipstick 09/28/2023 NEGATIVE  NEGATIVE Final   Bilirubin Urine 09/28/2023 NEGATIVE  NEGATIVE Final   Ketones, ur 09/28/2023 NEGATIVE  NEGATIVE mg/dL Final   Protein, ur 90/83/7974 NEGATIVE  NEGATIVE mg/dL Final   Nitrite 90/83/7974 NEGATIVE  NEGATIVE Final   Leukocytes,Ua 09/28/2023 NEGATIVE  NEGATIVE Final   Performed at [facility], [address] [place], [place] [address]    ECG: Date: 09/28/2023  Time ECG obtained: 0914 AM Rate: 60 bpm Rhythm: normal sinus Axis (leads I and aVF): normal Intervals: PR 168 ms. QRS 78 ms. QTc 374 ms. ST segment and T wave changes: No evidence of acute T wave abnormalities or significant ST segment elevation or depression.  Evidence of a possible, age undetermined, prior infarct:  No Comparison: Similar to previous tracing obtained on 08/29/2020   IMAGING / PROCEDURES: CT SHOULDER RIGHT W CONTRAST performed on 06/17/2022 Right shoulder arthroplasty in place. The device is normal in appearance without hardware complication seen. Undersurface tear of the anterior  infraspinatus measuring 1.1 cm from front to back. No tendon retraction or muscle atrophy. Moderate acromioclavicular osteoarthritis.  MYOCARDIAL PERFUSION IMAGING STUDY (LEXISCAN) performed on 02/20/2021 Normal left ventricular systolic function with a normal LVEF of 52% Normal myocardial thickening and wall motion Left ventricular cavity size normal SPECT images demonstrate homogenous tracer distribution throughout the myocardium No evidence of stress-induced myocardial ischemia or arrhythmia Normal low risk study  TRANSTHORACIC ECHOCARDIOGRAM performed on 02/20/2021 Normal left ventricular systolic function with an EF of >55% Left ventricular diastolic Doppler parameters consistent with abnormal relaxation (G1DD). Normal right ventricular systolic function Trivial AR and PR Mild MR and TR RVSP = 27.6 mmHg Mild aortic valve stenosis with a mean transvalvular pressure gradient of 9.5 mmHg; AVA (VTI) = 1.4 cm All remaining transvalvular gradients normal with no evidence of valvular stenosis No pericardial effusion   IMPRESSION AND PLAN: Samuel Gentry has been referred for pre-anesthesia review and clearance prior to him undergoing the planned anesthetic and procedural courses. Available labs, pertinent testing, and imaging results were personally reviewed by me in preparation for upcoming operative/procedural course. [facility] medical record has been updated following extensive record review and patient interview with PAT staff.   This patient has been appropriately cleared by cardiology with an overall LOW risk of patient experiencing significant perioperative cardiovascular complications. Based on clinical review performed today (10/06/23), barring any significant acute changes in the patient's overall condition, it is anticipated that he will be able to proceed with the planned surgical intervention. Any acute changes in clinical condition may necessitate his procedure being  postponed and/or cancelled. Patient will meet with anesthesia team (MD and/or CRNA) on the day of his procedure for preoperative evaluation/assessment. Questions regarding anesthetic course  will be fielded at that time.   Pre-surgical instructions were reviewed with the patient during his PAT appointment, and questions were fielded to satisfaction by PAT clinical staff. He has been instructed on which medications that he will need to hold prior to surgery, as well as the ones that have been deemed safe/appropriate to take on the day of his procedure. As part of the general education provided by PAT, patient made aware both verbally and in writing, that he would need to abstain from the use of any illegal substances during his perioperative course. He was advised that failure to follow the provided instructions could necessitate case cancellation or result in serious perioperative complications up to and including death. Patient encouraged to contact PAT and/or his surgeon's office to discuss any questions or concerns that may arise prior to surgery; verbalized understanding.   Dorise Pereyra, MSN, APRN, FNP-C, CEN [facility]  Perioperative Services Nurse Practitioner Phone: [phone] Fax: [phone] 10/06/23 3:21 PM  NOTE: This note has been prepared using Dragon dictation software. Despite my best ability to proofread, there is always the potential that unintentional transcriptional errors may still occur from this process.

## 2023-10-07 ENCOUNTER — Encounter: Payer: Self-pay | Admitting: Urgent Care

## 2023-10-08 ENCOUNTER — Encounter: Payer: Self-pay | Admitting: Orthopedic Surgery

## 2023-10-10 MED ORDER — DEXAMETHASONE SODIUM PHOSPHATE 10 MG/ML IJ SOLN
8.0000 mg | Freq: Once | INTRAMUSCULAR | Status: AC
  Administered 2023-10-11: 8 mg via INTRAVENOUS

## 2023-10-10 MED ORDER — SODIUM CHLORIDE 0.9 % IV SOLN: INTRAVENOUS | Status: DC

## 2023-10-10 MED ORDER — TRANEXAMIC ACID-NACL 1000-0.7 MG/100ML-% IV SOLN
1000.0000 mg | INTRAVENOUS | Status: AC
  Administered 2023-10-11 (×2): 1000 mg via INTRAVENOUS

## 2023-10-10 MED ORDER — CEFAZOLIN SODIUM-DEXTROSE 2-4 GM/100ML-% IV SOLN
2.0000 g | INTRAVENOUS | Status: AC
  Administered 2023-10-11: 2 g via INTRAVENOUS

## 2023-10-10 MED ORDER — ORAL CARE MOUTH RINSE
15.0000 mL | Freq: Once | OROMUCOSAL | Status: AC
Start: 2023-10-10 — End: 2023-10-11

## 2023-10-10 MED ORDER — CHLORHEXIDINE GLUCONATE 0.12 % MT SOLN
15.0000 mL | Freq: Once | OROMUCOSAL | Status: AC
  Administered 2023-10-11: 15 mL via OROMUCOSAL

## 2023-10-11 ENCOUNTER — Ambulatory Visit: Payer: Self-pay | Admitting: Urgent Care

## 2023-10-11 ENCOUNTER — Other Ambulatory Visit: Payer: Self-pay

## 2023-10-11 ENCOUNTER — Ambulatory Visit
Admission: RE | Admit: 2023-10-11 | Discharge: 2023-10-12 | Disposition: A | Discharge status: Home Health | Source: Ambulatory Visit | Attending: Orthopedic Surgery | Admitting: Orthopedic Surgery

## 2023-10-11 ENCOUNTER — Encounter: Payer: Self-pay | Admitting: Orthopedic Surgery

## 2023-10-11 ENCOUNTER — Encounter
Admission: RE | Disposition: A | Discharge status: Home Health | Payer: Self-pay | Source: Ambulatory Visit | Attending: Orthopedic Surgery

## 2023-10-11 ENCOUNTER — Ambulatory Visit

## 2023-10-11 DIAGNOSIS — Z79899 Other long term (current) drug therapy: Secondary | ICD-10-CM | POA: Diagnosis not present

## 2023-10-11 DIAGNOSIS — Z87891 Personal history of nicotine dependence: Secondary | ICD-10-CM | POA: Diagnosis not present

## 2023-10-11 DIAGNOSIS — M1711 Unilateral primary osteoarthritis, right knee: Secondary | ICD-10-CM | POA: Diagnosis present

## 2023-10-11 DIAGNOSIS — I10 Essential (primary) hypertension: Secondary | ICD-10-CM | POA: Diagnosis not present

## 2023-10-11 DIAGNOSIS — K219 Gastro-esophageal reflux disease without esophagitis: Secondary | ICD-10-CM | POA: Diagnosis not present

## 2023-10-11 DIAGNOSIS — F32A Depression, unspecified: Secondary | ICD-10-CM | POA: Insufficient documentation

## 2023-10-11 DIAGNOSIS — Z96651 Presence of right artificial knee joint: Secondary | ICD-10-CM

## 2023-10-11 DIAGNOSIS — E119 Type 2 diabetes mellitus without complications: Secondary | ICD-10-CM | POA: Insufficient documentation

## 2023-10-11 DIAGNOSIS — E785 Hyperlipidemia, unspecified: Secondary | ICD-10-CM | POA: Diagnosis not present

## 2023-10-11 DIAGNOSIS — Z7985 Long-term (current) use of injectable non-insulin antidiabetic drugs: Secondary | ICD-10-CM | POA: Diagnosis not present

## 2023-10-11 DIAGNOSIS — Z794 Long term (current) use of insulin: Secondary | ICD-10-CM | POA: Insufficient documentation

## 2023-10-11 DIAGNOSIS — I2510 Atherosclerotic heart disease of native coronary artery without angina pectoris: Secondary | ICD-10-CM | POA: Insufficient documentation

## 2023-10-11 DIAGNOSIS — E1159 Type 2 diabetes mellitus with other circulatory complications: Secondary | ICD-10-CM

## 2023-10-11 DIAGNOSIS — F419 Anxiety disorder, unspecified: Secondary | ICD-10-CM | POA: Insufficient documentation

## 2023-10-11 DIAGNOSIS — Z7984 Long term (current) use of oral hypoglycemic drugs: Secondary | ICD-10-CM | POA: Diagnosis not present

## 2023-10-11 DIAGNOSIS — G4733 Obstructive sleep apnea (adult) (pediatric): Secondary | ICD-10-CM | POA: Diagnosis not present

## 2023-10-11 HISTORY — PX: TOTAL KNEE ARTHROPLASTY: SHX125

## 2023-10-11 HISTORY — DX: Long term (current) use of aspirin: Z79.82

## 2023-10-11 HISTORY — DX: Calculus of kidney: N20.0

## 2023-10-11 HISTORY — DX: Angina pectoris, unspecified: I20.9

## 2023-10-11 HISTORY — DX: Atherosclerosis of aorta: I70.0

## 2023-10-11 HISTORY — DX: Diverticulosis of intestine, part unspecified, without perforation or abscess without bleeding: K57.90

## 2023-10-11 HISTORY — DX: Obstructive sleep apnea (adult) (pediatric): G47.33

## 2023-10-11 HISTORY — DX: Ventral hernia without obstruction or gangrene: K43.9

## 2023-10-11 HISTORY — DX: Solitary pulmonary nodule: R91.1

## 2023-10-11 HISTORY — DX: Fatty (change of) liver, not elsewhere classified: K76.0

## 2023-10-11 LAB — GLUCOSE, CAPILLARY
Glucose-Capillary: 126 mg/dL — ABNORMAL HIGH (ref 70–99)
Glucose-Capillary: 317 mg/dL — ABNORMAL HIGH (ref 70–99)
Glucose-Capillary: 201 mg/dL — ABNORMAL HIGH (ref 70–99)

## 2023-10-11 LAB — HEMOGLOBIN A1C
Hgb A1c MFr Bld: 6.5 % — ABNORMAL HIGH (ref 4.8–5.6)
Mean Plasma Glucose: 139.85 mg/dL

## 2023-10-11 SURGERY — ARTHROPLASTY, KNEE, TOTAL
Anesthesia: Spinal | Site: Knee | Laterality: Right

## 2023-10-11 MED ORDER — SURGIPHOR WOUND IRRIGATION SYSTEM - OPTIME: TOPICAL | Status: DC | PRN

## 2023-10-11 MED ORDER — BUPIVACAINE-EPINEPHRINE (PF) 0.25% -1:200000 IJ SOLN
INTRAMUSCULAR | Status: DC | PRN
  Administered 2023-10-11: 30 mL

## 2023-10-11 MED ORDER — ACETAMINOPHEN 500 MG PO TABS
1000.0000 mg | ORAL_TABLET | Freq: Three times a day (TID) | ORAL | Status: DC
  Administered 2023-10-11: 1000 mg via ORAL
  Filled 2023-10-11: qty 2

## 2023-10-11 MED ORDER — ROSUVASTATIN CALCIUM 10 MG PO TABS
40.0000 mg | ORAL_TABLET | Freq: Every day | ORAL | Status: DC
  Administered 2023-10-12: 40 mg via ORAL
  Filled 2023-10-11: qty 4

## 2023-10-11 MED ORDER — SERTRALINE HCL 50 MG PO TABS
50.0000 mg | ORAL_TABLET | Freq: Every day | ORAL | Status: DC
Start: 2023-10-12 — End: 2023-10-12
  Administered 2023-10-12: 50 mg via ORAL
  Filled 2023-10-11: qty 1

## 2023-10-11 MED ORDER — BUPIVACAINE LIPOSOME 1.3 % IJ SUSP
INTRAMUSCULAR | Status: AC
  Filled 2023-10-11: qty 20

## 2023-10-11 MED ORDER — SODIUM CHLORIDE (PF) 0.9 % IJ SOLN
INTRAMUSCULAR | Status: AC
  Filled 2023-10-11: qty 20

## 2023-10-11 MED ORDER — KETOROLAC TROMETHAMINE 15 MG/ML IJ SOLN
7.5000 mg | Freq: Four times a day (QID) | INTRAMUSCULAR | Status: AC
  Administered 2023-10-11 – 2023-10-12 (×4): 7.5 mg via INTRAVENOUS
  Filled 2023-10-11 (×4): qty 1

## 2023-10-11 MED ORDER — DROPERIDOL 2.5 MG/ML IJ SOLN: 0.6250 mg | Freq: Once | INTRAMUSCULAR | Status: DC | PRN

## 2023-10-11 MED ORDER — AMLODIPINE BESYLATE 10 MG PO TABS
5.0000 mg | ORAL_TABLET | Freq: Every day | ORAL | Status: DC
  Administered 2023-10-12: 5 mg via ORAL
  Filled 2023-10-11: qty 1

## 2023-10-11 MED ORDER — ENOXAPARIN SODIUM 30 MG/0.3ML IJ SOSY
30.0000 mg | PREFILLED_SYRINGE | Freq: Two times a day (BID) | INTRAMUSCULAR | Status: DC
  Administered 2023-10-12: 30 mg via SUBCUTANEOUS
  Filled 2023-10-11: qty 0.3

## 2023-10-11 MED ORDER — BUPIVACAINE HCL (PF) 0.5 % IJ SOLN
INTRAMUSCULAR | Status: AC
  Filled 2023-10-11: qty 10

## 2023-10-11 MED ORDER — ACETAMINOPHEN 10 MG/ML IV SOLN
INTRAVENOUS | Status: AC
Start: 2023-10-11 — End: 2023-10-11
  Filled 2023-10-11: qty 100

## 2023-10-11 MED ORDER — ONDANSETRON HCL 4 MG/2ML IJ SOLN
INTRAMUSCULAR | Status: DC | PRN
  Administered 2023-10-11: 4 mg via INTRAVENOUS

## 2023-10-11 MED ORDER — ONDANSETRON HCL 4 MG/2ML IJ SOLN
4.0000 mg | Freq: Four times a day (QID) | INTRAMUSCULAR | Status: DC | PRN
  Administered 2023-10-11: 4 mg via INTRAVENOUS
  Filled 2023-10-11: qty 2

## 2023-10-11 MED ORDER — DOCUSATE SODIUM 100 MG PO CAPS
100.0000 mg | ORAL_CAPSULE | Freq: Two times a day (BID) | ORAL | Status: DC
  Administered 2023-10-11 – 2023-10-12 (×3): 100 mg via ORAL
  Filled 2023-10-11 (×3): qty 1

## 2023-10-11 MED ORDER — TRANEXAMIC ACID-NACL 1000-0.7 MG/100ML-% IV SOLN
INTRAVENOUS | Status: AC
  Filled 2023-10-11: qty 100

## 2023-10-11 MED ORDER — CEFAZOLIN SODIUM-DEXTROSE 2-4 GM/100ML-% IV SOLN
INTRAVENOUS | Status: AC
  Filled 2023-10-11: qty 100

## 2023-10-11 MED ORDER — LIDOCAINE HCL (CARDIAC) PF 100 MG/5ML IV SOSY
PREFILLED_SYRINGE | INTRAVENOUS | Status: DC | PRN
  Administered 2023-10-11: 40 mg via INTRAVENOUS

## 2023-10-11 MED ORDER — METOCLOPRAMIDE HCL 10 MG PO TABS: 5.0000 mg | ORAL_TABLET | Freq: Three times a day (TID) | ORAL | Status: DC | PRN

## 2023-10-11 MED ORDER — SODIUM CHLORIDE 0.9 % IR SOLN
Status: DC | PRN
  Administered 2023-10-11: 3000 mL

## 2023-10-11 MED ORDER — ONDANSETRON HCL 4 MG PO TABS: 4.0000 mg | ORAL_TABLET | Freq: Four times a day (QID) | ORAL | Status: DC | PRN

## 2023-10-11 MED ORDER — SODIUM CHLORIDE (PF) 0.9 % IJ SOLN
INTRAMUSCULAR | Status: DC | PRN
  Administered 2023-10-11: 20 mL

## 2023-10-11 MED ORDER — INSULIN ASPART 100 UNIT/ML IJ SOLN: 0.0000 [IU] | Freq: Every day | INTRAMUSCULAR | Status: DC

## 2023-10-11 MED ORDER — PROPOFOL 1000 MG/100ML IV EMUL
INTRAVENOUS | Status: AC
  Filled 2023-10-11: qty 100

## 2023-10-11 MED ORDER — PANTOPRAZOLE SODIUM 40 MG PO TBEC
40.0000 mg | DELAYED_RELEASE_TABLET | Freq: Every day | ORAL | Status: DC
  Administered 2023-10-12: 40 mg via ORAL
  Filled 2023-10-11: qty 1

## 2023-10-11 MED ORDER — BUPIVACAINE-EPINEPHRINE (PF) 0.25% -1:200000 IJ SOLN
INTRAMUSCULAR | Status: AC
Start: 2023-10-11 — End: 2023-10-11
  Filled 2023-10-11: qty 30

## 2023-10-11 MED ORDER — OXYCODONE HCL 5 MG PO TABS
5.0000 mg | ORAL_TABLET | Freq: Once | ORAL | Status: AC
  Administered 2023-10-11: 5 mg via ORAL
  Filled 2023-10-11: qty 1

## 2023-10-11 MED ORDER — ASPIRIN 325 MG PO TABS
325.0000 mg | ORAL_TABLET | Freq: Every day | ORAL | Status: DC
  Administered 2023-10-12: 325 mg via ORAL
  Filled 2023-10-11: qty 1

## 2023-10-11 MED ORDER — LIDOCAINE HCL (PF) 2 % IJ SOLN
INTRAMUSCULAR | Status: AC
  Filled 2023-10-11: qty 5

## 2023-10-11 MED ORDER — LISINOPRIL 20 MG PO TABS
20.0000 mg | ORAL_TABLET | Freq: Every day | ORAL | Status: DC
  Administered 2023-10-11 – 2023-10-12 (×2): 20 mg via ORAL

## 2023-10-11 MED ORDER — MORPHINE SULFATE (PF) 2 MG/ML IV SOLN
0.5000 mg | INTRAVENOUS | Status: DC | PRN
  Administered 2023-10-11: 1 mg via INTRAVENOUS
  Filled 2023-10-11: qty 1

## 2023-10-11 MED ORDER — BUPIVACAINE LIPOSOME 1.3 % IJ SUSP
INTRAMUSCULAR | Status: DC | PRN
  Administered 2023-10-11: 20 mL

## 2023-10-11 MED ORDER — CEFAZOLIN SODIUM-DEXTROSE 2-4 GM/100ML-% IV SOLN
2.0000 g | Freq: Four times a day (QID) | INTRAVENOUS | Status: AC
  Administered 2023-10-11 (×2): 2 g via INTRAVENOUS
  Filled 2023-10-11 (×2): qty 100

## 2023-10-11 MED ORDER — MIDAZOLAM HCL 2 MG/2ML IJ SOLN
INTRAMUSCULAR | Status: AC
  Filled 2023-10-11: qty 2

## 2023-10-11 MED ORDER — PHENOL 1.4 % MT LIQD: 1.0000 | OROMUCOSAL | Status: DC | PRN

## 2023-10-11 MED ORDER — FENTANYL CITRATE (PF) 100 MCG/2ML IJ SOLN
INTRAMUSCULAR | Status: AC
  Filled 2023-10-11: qty 2

## 2023-10-11 MED ORDER — LISINOPRIL 20 MG PO TABS
ORAL_TABLET | ORAL | Status: AC
  Filled 2023-10-11: qty 1

## 2023-10-11 MED ORDER — INSULIN ASPART 100 UNIT/ML IJ SOLN
0.0000 [IU] | Freq: Three times a day (TID) | INTRAMUSCULAR | Status: DC
  Administered 2023-10-11: 11 [IU] via SUBCUTANEOUS
  Administered 2023-10-12: 2 [IU] via SUBCUTANEOUS
  Filled 2023-10-11 (×2): qty 1

## 2023-10-11 MED ORDER — FENTANYL CITRATE (PF) 100 MCG/2ML IJ SOLN: 25.0000 ug | INTRAMUSCULAR | Status: DC | PRN

## 2023-10-11 MED ORDER — MENTHOL 3 MG MT LOZG: 1.0000 | LOZENGE | OROMUCOSAL | Status: DC | PRN

## 2023-10-11 MED ORDER — METOCLOPRAMIDE HCL 5 MG/ML IJ SOLN: 5.0000 mg | Freq: Three times a day (TID) | INTRAMUSCULAR | Status: DC | PRN

## 2023-10-11 MED ORDER — ACETAMINOPHEN 325 MG PO TABS: 325.0000 mg | ORAL_TABLET | Freq: Four times a day (QID) | ORAL | Status: DC | PRN

## 2023-10-11 MED ORDER — PROPOFOL 500 MG/50ML IV EMUL
INTRAVENOUS | Status: DC | PRN
  Administered 2023-10-11: 100 ug/kg/min via INTRAVENOUS
  Administered 2023-10-11: 125 ug/kg/min via INTRAVENOUS

## 2023-10-11 MED ORDER — CHLORHEXIDINE GLUCONATE 0.12 % MT SOLN
OROMUCOSAL | Status: AC
  Filled 2023-10-11: qty 15

## 2023-10-11 MED ORDER — HYDROCODONE-ACETAMINOPHEN 5-325 MG PO TABS
1.0000 | ORAL_TABLET | ORAL | Status: DC | PRN
  Administered 2023-10-11: 2 via ORAL
  Administered 2023-10-11: 1 via ORAL
  Administered 2023-10-12: 2 via ORAL
  Administered 2023-10-12: 1 via ORAL
  Filled 2023-10-11: qty 2
  Filled 2023-10-11: qty 1
  Filled 2023-10-11 (×2): qty 2

## 2023-10-11 MED ORDER — BUPIVACAINE HCL (PF) 0.5 % IJ SOLN
INTRAMUSCULAR | Status: DC | PRN
  Administered 2023-10-11: 2.5 mL via INTRATHECAL

## 2023-10-11 MED ORDER — MIDAZOLAM HCL 5 MG/5ML IJ SOLN
INTRAMUSCULAR | Status: DC | PRN
  Administered 2023-10-11 (×2): 1 mg via INTRAVENOUS

## 2023-10-11 MED ORDER — TRAMADOL HCL 50 MG PO TABS
50.0000 mg | ORAL_TABLET | Freq: Four times a day (QID) | ORAL | Status: DC | PRN
  Administered 2023-10-11: 50 mg via ORAL
  Filled 2023-10-11: qty 1

## 2023-10-11 MED ORDER — SODIUM CHLORIDE 0.9 % IV SOLN: INTRAVENOUS | Status: DC

## 2023-10-11 MED ORDER — ACETAMINOPHEN 10 MG/ML IV SOLN
INTRAVENOUS | Status: DC | PRN
  Administered 2023-10-11: 1000 mg via INTRAVENOUS

## 2023-10-11 SURGICAL SUPPLY — 58 items
BLADE PATELLA W-PILOT HOLE 35 (BLADE) IMPLANT
BLADE SAW 90X13X1.19 OSCILLAT (BLADE) IMPLANT
BLADE SAW SAG 25X90X1.19 (BLADE) ×1 IMPLANT
BLADE SAW SAG 29X58X.64 (BLADE) ×1 IMPLANT
BNDG ELASTIC 6INX 5YD STR LF (GAUZE/BANDAGES/DRESSINGS) ×1 IMPLANT
BOWL CEMENT MIX W/ADAPTER (MISCELLANEOUS) IMPLANT
BRUSH SCRUB EZ PLAIN DRY (MISCELLANEOUS) IMPLANT
CHLORAPREP W/TINT 26 (MISCELLANEOUS) ×2 IMPLANT
COMPONENT FEM PS KN STD 10 RT (Joint) IMPLANT
COMPONENT PATELLA 3 PEG 41 (Joint) IMPLANT
COMPONENT TIB KNEE PS G 0 RT (Joint) IMPLANT
COOLER ICEMAN CLASSIC (MISCELLANEOUS) ×1 IMPLANT
CUFF TRNQT CYL 24X4X16.5-23 (TOURNIQUET CUFF) IMPLANT
CUFF TRNQT CYL 30X4X21-28X (TOURNIQUET CUFF) IMPLANT
DERMABOND ADVANCED .7 DNX12 (GAUZE/BANDAGES/DRESSINGS) ×1 IMPLANT
DRAPE SHEET LG 3/4 BI-LAMINATE (DRAPES) ×2 IMPLANT
DRSG MEPILEX SACRM 8.7X9.8 (GAUZE/BANDAGES/DRESSINGS) ×1 IMPLANT
DRSG OPSITE POSTOP 4X10 (GAUZE/BANDAGES/DRESSINGS) IMPLANT
DRSG OPSITE POSTOP 4X8 (GAUZE/BANDAGES/DRESSINGS) IMPLANT
ELECTRODE REM PT RTRN 9FT ADLT (ELECTROSURGICAL) ×1 IMPLANT
GLOVE BIO SURGEON STRL SZ8 (GLOVE) ×1 IMPLANT
GLOVE BIOGEL PI IND STRL 8 (GLOVE) ×1 IMPLANT
GLOVE PI ORTHO PRO STRL 7.5 (GLOVE) ×2 IMPLANT
GLOVE PI ORTHO PRO STRL SZ8 (GLOVE) ×2 IMPLANT
GLOVE SURG SYN 7.5 PF PI (GLOVE) ×1 IMPLANT
GOWN SRG XL LVL 3 NONREINFORCE (GOWNS) ×1 IMPLANT
GOWN STRL REUS W/ TWL LRG LVL3 (GOWN DISPOSABLE) ×1 IMPLANT
GOWN STRL REUS W/ TWL XL LVL3 (GOWN DISPOSABLE) ×1 IMPLANT
HOOD PEEL AWAY T7 (MISCELLANEOUS) ×2 IMPLANT
INSERT TIBIAL PERSONA RT 11 (Joint) IMPLANT
KIT TURNOVER KIT A (KITS) ×1 IMPLANT
MANIFOLD NEPTUNE II (INSTRUMENTS) ×1 IMPLANT
MARKER SKIN DUAL TIP RULER LAB (MISCELLANEOUS) ×1 IMPLANT
MAT ABSORB FLUID 56X50 GRAY (MISCELLANEOUS) ×1 IMPLANT
NDL HYPO 21X1.5 SAFETY (NEEDLE) ×1 IMPLANT
NEEDLE HYPO 21X1.5 SAFETY (NEEDLE) ×1 IMPLANT
PACK TOTAL KNEE (MISCELLANEOUS) ×1 IMPLANT
PAD ARMBOARD POSITIONER FOAM (MISCELLANEOUS) ×3 IMPLANT
PAD COLD UNI WRAP-ON (PAD) ×1 IMPLANT
PENCIL SMOKE EVACUATOR (MISCELLANEOUS) ×1 IMPLANT
PIN DRILL HDLS TROCAR 75 4PK (PIN) IMPLANT
SCREW FEMALE HEX FIX 25X2.5 (ORTHOPEDIC DISPOSABLE SUPPLIES) IMPLANT
SCREW HEX HEADED 3.5X27 DISP (ORTHOPEDIC DISPOSABLE SUPPLIES) IMPLANT
SLEEVE SCD COMPRESS KNEE MED (STOCKING) ×1 IMPLANT
SOL .9 NS 3000ML IRR UROMATIC (IV SOLUTION) ×1 IMPLANT
SOLN STERILE WATER 1000 ML (IV SOLUTION) ×1 IMPLANT
SOLN STERILE WATER BTL 1000 ML (IV SOLUTION) ×1 IMPLANT
SOLUTION IRRIG SURGIPHOR (IV SOLUTION) ×1 IMPLANT
STOCKINETTE IMPERV 14X48 (MISCELLANEOUS) ×1 IMPLANT
SUT STRATAFIX 14 PDO 36 VLT (SUTURE) ×1 IMPLANT
SUT VIC AB 0 CT1 36 (SUTURE) ×1 IMPLANT
SUT VIC AB 2-0 CT2 27 (SUTURE) ×2 IMPLANT
SUTURE STRATA SPIR 4-0 18 (SUTURE) ×1 IMPLANT
SUTURE VICRYL 1-0 27IN ABS (SUTURE) ×1 IMPLANT
SYR 20ML LL LF (SYRINGE) ×2 IMPLANT
TAPE CLOTH 3X10 WHT NS LF (GAUZE/BANDAGES/DRESSINGS) ×1 IMPLANT
TIP FAN IRRIG PULSAVAC PLUS (DISPOSABLE) ×1 IMPLANT
TRAP FLUID SMOKE EVACUATOR (MISCELLANEOUS) ×1 IMPLANT

## 2023-10-11 NOTE — Progress Notes (Signed)
 Verbal order received by Dr Lorelle to give pt 5 mg Oxycodone  once. Order placed.

## 2023-10-11 NOTE — Discharge Instructions (Signed)
 Instructions after Total Knee Replacement   Samuel Gentry M.D.     Dept. of Orthopaedics & Sports Medicine  [facility]  [address]  [place], [place]  [address]  Phone: [phone]   Fax: [phone]    DIET: Drink plenty of non-alcoholic fluids. Resume your normal diet. Include foods high in fiber.  ACTIVITY:  You may use crutches or a walker with weight-bearing as tolerated, unless instructed otherwise. You may be weaned off of the walker or crutches by your Physical Therapist.  Do NOT place pillows under the knee. Anything placed under the knee could limit your ability to straighten the knee.   Continue doing gentle exercises. Exercising will reduce the pain and swelling, increase motion, and prevent muscle weakness.   Please continue to use the TED compression stockings for 2 weeks. You may remove the stockings at night, but should reapply them in the morning. Do not drive or operate any equipment until instructed.  WOUND CARE:  Continue to use the PolarCare or ice packs periodically to reduce pain and swelling. You may begin showering 3 days after surgery with honeycomb dressing. Remove honeycomb dressing 7 days after surgery and continue showering. Allow dermabond to fall off on its own.  MEDICATIONS: You may resume your regular medications. Please take the pain medication as prescribed on the medication. Do not take pain medication on an empty stomach. You have been given a prescription for a blood thinner (Lovenox or Coumadin). Please take the medication as instructed. (NOTE: After completing a 2 week course of Lovenox, take one 81 mg Enteric-coated aspirin twice a day for 3 additional weeks. This along with elevation will help reduce the possibility of phlebitis in your operated leg.) Do not drive or drink alcoholic beverages when taking pain medications.  POSTOPERATIVE CONSTIPATION PROTOCOL Constipation - defined medically as fewer than three stools per  week and severe constipation as less than one stool per week.  One of the most common issues patients have following surgery is constipation.  Even if you have a regular bowel pattern at home, your normal regimen is likely to be disrupted due to multiple reasons following surgery.  Combination of anesthesia, postoperative narcotics, change in appetite and fluid intake all can affect your bowels.  In order to avoid complications following surgery, here are some recommendations in order to help you during your recovery period.  Colace (docusate) - Pick up an over-the-counter form of Colace or another stool softener and take twice a day as long as you are requiring postoperative pain medications.  Take with a full glass of water daily.  If you experience loose stools or diarrhea, hold the colace until you stool forms back up.  If your symptoms do not get better within 1 week or if they get worse, check with your doctor.  Dulcolax (bisacodyl) - Pick up over-the-counter and take as directed by the product packaging as needed to assist with the movement of your bowels.  Take with a full glass of water.  Use this product as needed if not relieved by Colace only.   MiraLax (polyethylene glycol) - Pick up over-the-counter to have on hand.  MiraLax is a solution that will increase the amount of water in your bowels to assist with bowel movements.  Take as directed and can mix with a glass of water, juice, soda, coffee, or tea.  Take if you go more than two days without a movement. Do not use MiraLax more than once per day.  Call your doctor if you are still constipated or irregular after using this medication for 7 days in a row.  If you continue to have problems with postoperative constipation, please contact the office for further assistance and recommendations.  If you experience "the worst abdominal pain ever" or develop nausea or vomiting, please contact the office immediatly for further recommendations for  treatment.   CALL THE OFFICE FOR: Temperature above 101 degrees Excessive bleeding or drainage on the dressing. Excessive swelling, coldness, or paleness of the toes. Persistent nausea and vomiting.  FOLLOW-UP:  You should have an appointment to return to the office in 14 days after surgery. Arrangements have been made for continuation of Physical Therapy (either home therapy or outpatient therapy).

## 2023-10-11 NOTE — Discharge Summary (Signed)
 Physician Discharge Summary  Patient ID: Samuel Gentry MRN: [id number] DOB/AGE: 68-Dec-1957 68 y.o.  Admit date: 10/11/2023 Discharge date: 10/12/2023  Admission Diagnoses:  Primary osteoarthritis of right knee [M17.11] S/P total knee arthroplasty, right [Z96.651]   Discharge Diagnoses: Patient Active Problem List   Diagnosis Date Noted   S/P total knee arthroplasty, right 10/11/2023   Bilateral carotid artery stenosis 02/27/2021   Mild aortic stenosis 02/27/2021   OSA (obstructive sleep apnea) 08/10/2018   Unstable angina (HCC) 07/07/2018   Benign essential HTN 07/07/2018   Atherosclerotic heart disease of native coronary artery without angina pectoris 07/04/2018   Stress fracture, left foot, initial encounter for fracture 10/16/2017   Seronegative rheumatoid arthritis (HCC) 08/06/2016   Chronic inflammatory arthritis 07/08/2016   Pain of upper abdomen 10/02/2015   Diastasis recti 10/02/2015   Foot pain, right 07/25/2014   AA (alopecia areata) 04/25/2014   Angina of effort 04/25/2014   Arthritis 04/25/2014   CAD in native artery 04/25/2014   History of chicken pox 04/25/2014   Abrasion of cornea 04/25/2014   Depression, major, single episode, mild 04/25/2014   HLD (hyperlipidemia) 04/25/2014   BP (high blood pressure) 04/25/2014   Chest pain 02/09/2012   CAD (coronary artery disease) 02/09/2012   S/P coronary artery stent placement 02/09/2012   Diabetes mellitus (HCC) 02/09/2012   Hyperlipidemia 02/09/2012    Past Medical History:  Diagnosis Date   Angina pectoris    Anxiety    Aortic atherosclerosis    Aortic stenosis    Arthritis    Coronary atherosclerosis of native coronary artery 11/19/2004   a.) LHC/PCI 11/19/2004: 20-30% pLAD, 95% pLAD just after 1st septal (3.0 x 18 mm Cypher DES), 70% mLAD (3.5 x 13 mm Cypher DES), 80% pLCx, 60-70% oOM1, 50% dLCx --> plans for staged PCI; b.) PCI 12/01/2004: 80% pLCx (3.0 x 18 mm Cypher DES), 60-70% pOM1 (3.0 x 18 mm  Cypher DES); c.) LHC 07/12/2018: 60% pLAD, 35% mLAD, 30% o-pLCx, 85% o-pRCA; 75% pRCA, 60% mRCA, 10% m-dLAD - Rx mgmt   DDD (degenerative disc disease), lumbar    Depression    Diastolic dysfunction    Diverticulosis    GERD (gastroesophageal reflux disease)    Hepatic steatosis    History of 2019 novel coronavirus disease (COVID-19) 06/2020   HLD (hyperlipidemia)    HTN (hypertension)    Long-term use of aspirin  therapy (FULL DOSE)    Nephrolithiasis    OSA (obstructive sleep apnea)    a.) no nocturnal PAP therapy   Presence of stent in coronary artery 11/19/2004   a.) TOTAL # stents of as 10/06/2023: 4   Right lower lobe pulmonary nodule    T2DM (type 2 diabetes mellitus) (HCC)    Ventral hernia    Wears glasses      Transfusion: none   Consultants (if any):   Discharged Condition: Improved  Hospital Course: Samuel Gentry is an 68 y.o. male who was admitted 10/11/2023 with a diagnosis of S/P total knee arthroplasty, right and went to the operating room on 10/11/2023 and underwent the above named procedures.    Surgeries: Procedure(s): ARTHROPLASTY, KNEE, TOTAL on 10/11/2023 Patient tolerated the surgery well. Taken to PACU where she was stabilized and then transferred to the orthopedic floor.  Started on Lovenox 30 mg q 12 hrs. TEDs and SCDs applied bilaterally. Heels elevated on bed. No evidence of DVT. Negative Homan. Physical therapy started on day #1 for gait training and transfer. OT started day #1  for ADL and assisted devices.  Patient's IV was d/c on day #1. Patient was able to safely and independently complete all PT goals. PT recommending discharge to home.    On post op day #1 patient was stable and ready for discharge to home with HHPT.  Implants: Femur: Persona Size 10 CR PPS   Tibia: Personas Size G OsseoTi  Poly: 11mm MC  Patella: 41x4mm symmetric OsseoTi   He was given perioperative antibiotics:  Anti-infectives (From admission, onward)    Start      Dose/Rate Route Frequency Ordered Stop   10/11/23 1330  ceFAZolin  (ANCEF ) IVPB 2g/100 mL premix        2 g 200 mL/hr over 30 Minutes Intravenous Every 6 hours 10/11/23 1228 10/12/23 0129   10/11/23 0600  ceFAZolin  (ANCEF ) IVPB 2g/100 mL premix        2 g 200 mL/hr over 30 Minutes Intravenous On call to O.R. 10/10/23 2158 10/11/23 0800     .  He was given sequential compression devices, early ambulation, and *** for DVT prophylaxis.  He benefited maximally from the hospital stay and there were no complications.    Recent vital signs:  Vitals:   10/11/23 1250 10/11/23 1439  BP: 125/69 126/67  Pulse: 61 67  Resp:  17  Temp:  97.8 F (36.6 C)  SpO2:  94%    Recent laboratory studies:  Lab Results  Component Value Date   HGB 12.6 (L) 09/28/2023   HGB 13.5 10/03/2021   HGB 13.1 10/30/2020   Lab Results  Component Value Date   WBC 5.1 09/28/2023   PLT 225 09/28/2023   No results found for: INR Lab Results  Component Value Date   NA 135 09/28/2023   K 5.1 09/28/2023   CL 102 09/28/2023   CO2 20 (L) 09/28/2023   BUN 30 (H) 09/28/2023   CREATININE 1.07 09/28/2023   GLUCOSE 122 (H) 09/28/2023    Discharge Medications:   Allergies as of 10/11/2023       Reactions   Prednisone Other (See Comments)   Makes him very mean     Med Rec must be completed prior to using this [facility]***       Diagnostic Studies: DG Knee Right Port Result Date: 10/11/2023 CLINICAL DATA:  Status post total knee replacement EXAM: PORTABLE RIGHT KNEE - 1-2 VIEW COMPARISON:  None available FINDINGS: Right total knee prosthesis is well seated without periprosthetic fracture or lucency. Soft tissue emphysema is consistent with immediate postop status. Atherosclerotic changes seen throughout visualized arterial segments. IMPRESSION: Uncomplicated right total knee prosthesis with immediate postop changes. Electronically Signed   By: Aliene Lloyd M.D.   On: 10/11/2023 12:24    Disposition:       Follow-up Information     Charlene Debby BROCKS, PA-C Follow up in 2 week(s).   Specialties: Orthopedic Surgery, Emergency Medicine Contact information: [address] [place] [address] [phone]                  Signed: Debby BROCKS Charlene 10/11/2023, 4:43 PM

## 2023-10-11 NOTE — H&P (Signed)
 History of Present Illness: Samuel Gentry is an 68 y.o. male who presents today for history and physical for right total knee arthroplasty with Dr. Lorelle on 10/11/2023. Gentry has had moderate to severe debilitating pain in his right knee that has interfered with his quality of life and activities daily living. He has x-ray showing complete loss of joint space in Samuel medial compartment of Samuel right knee with severe patellofemoral osteoarthritis. He has had a history of knee surgery 40 years ago for recurrent patellar dislocations that was successful with no complications. His pain is 9 out of 10 and will buckle and give way on him. He has had no relief with NSAIDs, activity modification, injections. Gentry is seen Dr. Lorelle discussed total knee arthroplasty and agreed consented Samuel procedure.  Gentry is a non-smoker well-controlled diabetic with an A1c of 6.6 and a BMI of 27.3  Past Medical History: Past Medical History:  Diagnosis Date  Arthritis  Bilateral carotid artery stenosis 02/27/2021  CAD (coronary artery disease)  COVID-19 06/2020  Depression  Diabetes mellitus type 2, uncomplicated (CMS/HHS-HCC)  GERD (gastroesophageal reflux disease)  Hand pain  Heart disease  Hyperlipidemia  Hypertension  OSA (obstructive sleep apnea) 08/10/2018   Past Surgical History: Past Surgical History:  Procedure Laterality Date  BACK SURGERY  CORONARY ANGIOPLASTY  FUSION ARTHRODESIS ANKLE W/ARTHROSCOPY Left  neck surgery  TOTAL SHOULDER REPLACEMENT Right  wrist surgery   Past Family History: No family history on file.  Medications: Current Outpatient Medications  Medication Sig Dispense Refill  amLODIPine  (NORVASC ) 5 MG tablet Take 1 tablet (5 mg total) by mouth once daily 90 tablet 3  aspirin  325 MG tablet Take 325 mg by mouth once daily  BD ULTRA-FINE NANO PEN NEEDLE 32 gauge x 5/32 Ndle 1 each by Other route as directed 100 each 6  blood-glucose sensor (FREESTYLE LIBRE 3 PLUS  SENSOR) Devi Use 1 each every 15 (fifteen) days DX E11.9 6 each 1  dulaglutide  (TRULICITY ) 0.75 mg/0.5 mL subcutaneous pen injector Inject 0.5 mLs (0.75 mg total) subcutaneously every 7 (seven) days 9 mL 3  flash glucose sensor (FREESTYLE LIBRE 2 SENSOR) Kit Inject 1 kit into Samuel muscle as directed 6 each 6  insulin  DEGLUDEC (TRESIBA  FLEXTOUCH) pen injector (concentration 100 units/mL) Inject 27 Units subcutaneously once daily Take 30-32 units daily. 28.8 mL 3  lisinopriL  (ZESTRIL ) 20 MG tablet Take 1 tablet (20 mg total) by mouth once daily 90 tablet 3  metFORMIN  (GLUCOPHAGE ) 1000 MG tablet Take 1 tablet (1,000 mg total) by mouth 2 (two) times daily 180 tablet 3  multivitamin tablet Take 1 tablet by mouth once daily  omeprazole  (PRILOSEC) 20 MG DR capsule Take 1 capsule (20 mg total) by mouth once daily 90 capsule 3  rosuvastatin  (CRESTOR ) 40 MG tablet TAKE 1 TABLET BY MOUTH ONCE EVERY EVENING 90 tablet 3  sertraline  (ZOLOFT ) 50 MG tablet Take 1 tablet (50 mg total) by mouth once daily 90 tablet 1   No current facility-administered medications for this visit.   Allergies: Allergies  Allergen Reactions  Prednisone Other (See Comments)  Makes him very mean Makes him very mean    Visit Vitals: Vitals:  09/24/23 0916  BP: 124/78     Review of Systems:  A comprehensive 14 point ROS was performed, reviewed, and Samuel pertinent orthopaedic findings are documented in Samuel HPI.  Physical Exam: General:  Well developed, well nourished, no apparent distress, normal affect, normal gait with no antalgic component.   HEENT: Head normocephalic,  atraumatic, PERRL.   Abdomen: Soft, non tender, non distended, Bowel sounds present.  Heart: Examination of Samuel heart reveals regular, rate, and rhythm. There is no murmur noted on ascultation. There is a normal apical pulse.  Lungs: Lungs are clear to auscultation. There is no wheeze, rhonchi, or crackles. There is normal expansion of bilateral  chest walls.   Comprehensive Knee Exam: Gait Stiff gait  Alignment Neutral   Inspection Right Left  Skin Normal appearance with no obvious deformity. Healed linear incision over Samuel midline slightly medial well-healed ,no ecchymosis or erythema. Normal appearance with no obvious deformity. No ecchymosis or erythema.  Soft Tissue No focal soft tissue swelling No focal soft tissue swelling  Quad Atrophy None None   Palpation  Right Left  Tenderness Medial joint line, lateral joint line and parapatellar tenderness to palpation Medial joint line and parapatellar tenderness palpation  Crepitus + patellofemoral crepitus No patellofemoral or tibiofemoral crepitus  Effusion None None   Range of Motion Right Left  Flexion 0-95 0-110  Extension Full knee extension without hyperextension Full knee extension without hyperextension   Ligamentous Exam Right Left  Lachman Normal Normal  Valgus 0 Normal Normal  Valgus 30 Normal Normal  Varus 0 Normal Normal  Varus 30 Normal Normal  Anterior Drawer Normal Normal  Posterior Drawer Normal Normal   Meniscal Exam Right Left  Hyperflexion Test Positive Negative  Hyperextension Test Positive Negative  McMurray's Positive Positive   Neurovascular Right Left  Quadriceps Strength 5/5 5/5  Hamstring Strength 5/5 5/5  Hip Abductor Strength 4/5 4/5  Distal Motor Normal Normal  Distal Sensory Normal light touch sensation Normal light touch sensation  Distal Pulses Normal Normal   Imaging Studies: I reviewed x-rays of both knees from 08/31/2023, right knee with moderate to severe degenerative changes with significant ossification in Samuel patellofemoral joint and superior to Samuel superior pole Samuel patella with medial joint space narrowing osteophyte formation and sclerosis. Kellgren-Lawrence grade 3/4. Left knee shows mild medial joint space narrowing with proximal tibial sclerosis with some osteophyte and bony formation over Samuel medial femoral condyle  Kellgren-Lawrence grade 2. No fractures or dislocations noted in either knee.  Assessment:  ICD-10-CM  1. Primary osteoarthritis of right knee M17.11  2. Effusion of right knee M25.461     Plan: Daltin is a 68 year old male with severe right knee osteoarthritis. He has complete loss of joint space in Samuel medial compartment of Samuel right knee. He has had years of increasing pain. Pain has progressed to Samuel point where it is interfering with his quality of life and activities daily living. He has had little to no relief with conservative treatment. Risks, benefits, complications of a right total knee arthroplasty have discussed with Samuel Gentry.  Samuel hospitalization and post-operative care and rehabilitation were also discussed. Samuel use of perioperative antibiotics and DVT prophylaxis were discussed. Samuel risk, benefits and alternatives to a surgical intervention were discussed at length with Samuel Gentry. Samuel Gentry was also advised of risks related to Samuel medical comorbidities and elevated body mass index (BMI). A lengthy discussion took place to review Samuel most common complications including but not limited to: stiffness, loss of function, complex regional pain syndrome, deep vein thrombosis, pulmonary embolus, heart attack, stroke, infection, wound breakdown, numbness, intraoperative fracture, damage to nerves, tendon,muscles, arteries or other blood vessels, death and other possible complications from anesthesia. Samuel Gentry was told that we will take steps to minimize these risks by using sterile technique, antibiotics and DVT  prophylaxis when appropriate and follow Samuel Gentry postoperatively in Samuel office setting to monitor progress. Samuel possibility of recurrent pain, no improvement in pain and actual worsening of pain were also discussed with Samuel Gentry.   All questions answered Gentry agrees with above plan for right total knee arthroplasty.

## 2023-10-11 NOTE — Transfer of Care (Signed)
 Immediate Anesthesia Transfer of Care Note  Patient: Samuel Gentry  Procedure(s) Performed: ARTHROPLASTY, KNEE, TOTAL (Right: Knee)  Patient Location: PACU  Anesthesia Type:Spinal  Level of Consciousness: awake, alert , and oriented  Airway & Oxygen Therapy: Patient Spontanous Breathing  Post-op Assessment: Report given to RN and Post -op Vital signs reviewed and stable  Post vital signs: Reviewed and stable  Last Vitals:  Vitals Value Taken Time  BP 101/67 10/11/23 10:01  Temp 36.1 1001  Pulse 58 10/11/23 10:04  Resp 14 10/11/23 10:04  SpO2 97 % 10/11/23 10:04  Vitals shown include unfiled device data.  Last Pain:  Vitals:   10/11/23 0617  TempSrc: Temporal  PainSc: 0-No pain         Complications: No notable events documented.

## 2023-10-11 NOTE — Interval H&P Note (Signed)
Patient history and physical updated. Consent reviewed including risks, benefits, and alternatives to surgery. Patient agrees with above plan to proceed with right total knee arthroplasty  

## 2023-10-11 NOTE — Progress Notes (Signed)
 Patient is not able to walk the distance required to go the bathroom, or he is unable to safely negotiate stairs required to access the bathroom.  A 3in1 BSC will alleviate this problem.        Lollie Marrow, PA-C [facility]

## 2023-10-11 NOTE — Inpatient Diabetes Management (Signed)
 Inpatient Diabetes Program Recommendations  AACE/ADA: New Consensus Statement on Inpatient Glycemic Control (2015)  Target Ranges:  Prepandial:   less than 140 mg/dL      Peak postprandial:   less than 180 mg/dL (1-2 hours)      Critically ill patients:  140 - 180 mg/dL    Latest Reference Range & Units 10/03/21 08:07  Hemoglobin A1C 4.8 - 5.6 % 6.5 (H)  (H): Data is abnormally high  Latest Reference Range & Units 10/11/23 06:17  Glucose-Capillary 70 - 99 mg/dL 873 (H)  8 mg Decadron  @0754   (H): Data is abnormally high       Admit with: R Total Knee  History: DM2  Home DM Meds: Freestyle Libre 2 CGM        Trulicity  0.75 mg Qweek        Tresiba  27 units daily        Metformin  1000 mg BID  Current Orders: Novolog Moderate Correction Scale/ SSI (0-15 units) TID AC + HS     Note Pt received Decadron  this AM X 1 dose  CBGs will likely be elevated this evening  MD- If 5pm CBG >150, Please start Lantus 15 units daily this afternoon (50% home dose)     --Will follow patient during hospitalization--  Adina Rudolpho Arrow RN, MSN, CDCES Diabetes Coordinator Inpatient Glycemic Control Team Team Pager: [phone] (8a-5p)

## 2023-10-11 NOTE — Evaluation (Signed)
 Physical Therapy Evaluation Patient Details Name: Samuel Gentry MRN: [id number] DOB: Dec 23, 1955 Today's Date: 10/11/2023  History of Present Illness  Patient is a 68 year old male with right knee osteoarthritis s/p right TKA. PMH: arthritis, diabetes, CAD with stents  Clinical Impression  Patient is agreeable to PT evaluation. Supportive family at bedside. He is independent without a device at baseline with ambulation, however is very limited in gait distance due to pain.  Today the patient reports moderate pain in the knee that does not increase with activity. Gait training initiated with occasional cues for improved gait kinematics. Patient is hopeful for discharge home tomorrow. PT will continue to follow to maximize independence and decrease caregiver burden.       If plan is discharge home, recommend the following: Assist for transportation;Assistance with cooking/housework   Can travel by private vehicle        Equipment Recommendations Rolling walker (2 wheels);BSC/3in1  Recommendations for Other Services       Functional Status Assessment Patient has had a recent decline in their functional status and demonstrates the ability to make significant improvements in function in a reasonable and predictable amount of time.     Precautions / Restrictions Precautions Precautions: Knee Recall of Precautions/Restrictions: Intact Restrictions Weight Bearing Restrictions Per Provider Order: Yes RLE Weight Bearing Per Provider Order: Weight bearing as tolerated      Mobility  Bed Mobility Overal bed mobility: Needs Assistance Bed Mobility: Supine to Sit, Sit to Supine     Supine to sit: Supervision Sit to supine: Min assist   General bed mobility comments: lifting assistance for RLE    Transfers Overall transfer level: Needs assistance Equipment used: Rolling walker (2 wheels) Transfers: Sit to/from Stand Sit to Stand: Contact guard assist           General  transfer comment: cues for hand placement with standing    Ambulation/Gait Ambulation/Gait assistance: Contact guard assist, Supervision Gait Distance (Feet): 100 Feet Assistive device: Rolling walker (2 wheels) Gait Pattern/deviations: Step-to pattern, Step-through pattern, Decreased stance time - right, Decreased dorsiflexion - right Gait velocity: decreased     General Gait Details: cues for heel toe gait pattern and proper use of rolling walker  Stairs            Wheelchair Mobility     Tilt Bed    Modified Rankin (Stroke Patients Only)       Balance Overall balance assessment: Needs assistance Sitting-balance support: Feet supported Sitting balance-Leahy Scale: Good     Standing balance support: Bilateral upper extremity supported, Single extremity supported Standing balance-Leahy Scale: Fair Standing balance comment: using rolling walker for UE support in standing. able to use urinal with unilateral UE support and no loss of balance                             Pertinent Vitals/Pain Pain Assessment Pain Assessment: Faces Faces Pain Scale: Hurts little more Pain Location: right knee Pain Descriptors / Indicators: Discomfort Pain Intervention(s): Limited activity within patient's tolerance, Monitored during session, Premedicated before session, Repositioned (encouraged polar care)    Home Living Family/patient expects to be discharged to:: Private residence Living Arrangements: Spouse/significant other Available Help at Discharge: Family Type of Home: House Home Access:  (one step)       Home Layout: One level Home Equipment: None      Prior Function Prior Level of Function : Independent/Modified Independent;Working/employed;Driving  Mobility Comments: limited distance due to pain       Extremity/Trunk Assessment   Upper Extremity Assessment Upper Extremity Assessment: Overall WFL for tasks assessed    Lower  Extremity Assessment Lower Extremity Assessment: RLE deficits/detail RLE Deficits / Details: SLR independently. 5/5 dorsiflexion/plantarflexion RLE Sensation: WNL       Communication   Communication Communication: No apparent difficulties    Cognition Arousal: Alert Behavior During Therapy: WFL for tasks assessed/performed   PT - Cognitive impairments: No apparent impairments                         Following commands: Intact       Cueing Cueing Techniques: Verbal cues     General Comments General comments (skin integrity, edema, etc.): supportive family at the bedside    Exercises Total Joint Exercises Goniometric ROM: right knee flexion 64 degrees   Assessment/Plan    PT Assessment Patient needs continued PT services  PT Problem List Decreased range of motion;Decreased activity tolerance;Decreased strength;Decreased balance;Decreased mobility;Decreased safety awareness;Decreased knowledge of precautions;Decreased knowledge of use of DME       PT Treatment Interventions DME instruction;Gait training;Stair training;Functional mobility training;Therapeutic activities;Therapeutic exercise;Balance training;Neuromuscular re-education;Cognitive remediation;Patient/family education    PT Goals (Current goals can be found in the Care Plan section)  Acute Rehab PT Goals Patient Stated Goal: to go home and eventually back to work PT Goal Formulation: With patient Time For Goal Achievement: 10/25/23 Potential to Achieve Goals: Good    Frequency BID     Co-evaluation               AM-PAC PT 6 Clicks Mobility  Outcome Measure Help needed turning from your back to your side while in a flat bed without using bedrails?: None Help needed moving from lying on your back to sitting on the side of a flat bed without using bedrails?: A Little Help needed moving to and from a bed to a chair (including a wheelchair)?: A Little Help needed standing up from a chair  using your arms (e.g., wheelchair or bedside chair)?: A Little Help needed to walk in hospital room?: A Little Help needed climbing 3-5 steps with a railing? : A Little 6 Click Score: 19    End of Session Equipment Utilized During Treatment: Gait belt Activity Tolerance: Patient tolerated treatment well Patient left: in bed;with call bell/phone within reach;with family/visitor present;with SCD's reapplied (SCD LLE, polar care RLE) Nurse Communication: Mobility status PT Visit Diagnosis: Other abnormalities of gait and mobility (R26.89);Difficulty in walking, not elsewhere classified (R26.2)    Time: 8544-8472 PT Time Calculation (min) (ACUTE ONLY): 32 min   Charges:   PT Evaluation $PT Eval Moderate Complexity: 1 Mod PT Treatments $Gait Training: 8-22 mins PT General Charges $$ ACUTE PT VISIT: 1 Visit         Randine Essex, PT, MPT   Randine LULLA Essex 10/11/2023, 3:51 PM

## 2023-10-11 NOTE — Anesthesia Procedure Notes (Signed)
 Spinal  Patient location during procedure: OR Start time: 10/11/2023 7:34 AM End time: 10/11/2023 7:45 AM Reason for block: surgical anesthesia Staffing Performed: resident/CRNA  Resident/CRNA: Jackye Spanner, CRNA Performed by: Jackye Spanner, CRNA Authorized by: Dario Barter, MD   Preanesthetic Checklist Completed: patient identified, IV checked, site marked, risks and benefits discussed, surgical consent, monitors and equipment checked, pre-op evaluation and timeout performed Spinal Block Patient position: sitting Prep: ChloraPrep Patient monitoring: heart rate, continuous pulse ox and blood pressure Approach: midline Location: L3-4 Injection technique: single-shot Needle Needle type: Introducer and Pencan  Needle gauge: 24 G Needle length: 10 cm Assessment Sensory level: T10 Events: CSF return Additional Notes Negative paresthesia. Negative blood return. Positive free-flowing CSF. Expiration date of kit checked and confirmed. Patient tolerated procedure well, without complications. Samuel Gentry, SRNA attempted spinal injection at L2-3, hitting OS. He repositioned to L3-4, and was successful with free-blowing CSF, free of blood. No complications noted.

## 2023-10-11 NOTE — Op Note (Signed)
 Patient Name: Samuel Gentry  FMW:[id number]  Pre-Operative Diagnosis: Right knee Osteoarthritis  Post-Operative Diagnosis: (same)  Procedure: Right Total Knee Arthroplasty  Components/Implants: Femur: Persona Size 10 CR PPS   Tibia: Personas Size G OsseoTi  Poly: 11mm MC  Patella: 41x57mm symmetric OsseoTi  Femoral Valgus Cut Angle: 5 degrees  Distal Femoral Re-cut: none  Patella Resurfacing: yes   Date of Surgery: 10/11/2023  Surgeon: Arthea Sheer MD  Assistant: Debby Amber PA (present and scrubbed throughout the case, critical for assistance with exposure, retraction, instrumentation, and closure)   Anesthesiologist: Dario  Anesthesia: Spinal   Tourniquet Time: 57 min  EBL: 50  IVF: 400  Complications: None   Brief history: The patient is a 68 year old male with a history of osteoarthritis of the right knee with pain limiting their range of motion and activities of daily living, which has failed multiple attempts at conservative therapy.  The risks and benefits of total knee arthroplasty as definitive surgical treatment were discussed with the patient, who opted to proceed with the operation.  After outpatient medical clearance and optimization was completed the patient was admitted to [facility] for the procedure.  All preoperative films were reviewed and an appropriate surgical plan was made prior to surgery. Preoperative range of motion was 0 to 95  Description of procedure: The patient was brought to the operating room where laterality was confirmed by all those present to be the right side.   Spinal anesthesia was administered and the patient received an intravenous dose of antibiotics for surgical prophylaxis and a dose of tranexamic acid .  Patient is positioned supine on the operating room table with all bony prominences well-padded.  A well-padded tourniquet was applied to the right thigh.  The knee was then prepped and draped in usual  sterile fashion with multiple layers of adhesive and nonadhesive drapes.  All of those present in the operating room participated in a surgical timeout laterality and patient were confirmed.   An Esmarch was wrapped around the extremity and the leg was elevated and the knee flexed.  The tourniquet was inflated to a pressure of 250 mmHg. The Esmarch was removed and the leg was brought down to full extension.  The patella and tibial tubercle identified and outlined using a marking pen and a midline skin incision was made with a knife carried through the subcutaneous tissue down to the extensor retinaculum.  After exposure of the extensor mechanism the medial parapatellar arthrotomy was performed with a scalpel and electrocautery extending down medial and distal to the tibial tubercle taking care to avoid incising the patellar tendon.   A standard medial release was performed over the proximal tibia.  The knee was brought into extension in order to excise the fat pad taking care not to damage the patella tendon.  The superior soft tissue was removed from the anterior surface of the distal femur to visualize for the procedure.  The knee was then brought into flexion with the patella subluxed laterally and subluxing the tibia anteriorly.  The ACL was transected and removed with electrocautery and additional soft tissue was removed from the proximal surface of the tibia to fully expose. The PCL was found to be intact and was preserved.  An extramedullary tibial cutting guide was then applied to the leg with a spring-loaded ankle clamp placed around the distal tibia just above the malleoli the angulation of the guide was adjusted to give some posterior slope in the tibial resection with an appropriate  varus/valgus alignment.  The resection guide was then pinned to the proximal tibia and the proximal tibial surface was resected with an oscillating saw.  Careful attention was paid to ensure the blade did not disrupt any  of the soft tissues including any lateral or medial ligament.  Attention was then turned to the femur, with the knee slightly flexed a opening drill was used to enter the medullary canal of the femur.  After removing the drill marrow was suctioned out to decompress the distal femur.  An intramedullary femoral guide was then inserted into the drill hole and the alignment guide was seated firmly against the distal end of the medial femoral condyle.  The distal femoral cutting guide was then attached and pinned securely to the anterior surface of the femur and the intramedullary rod and alignment guide was removed.  Distal femur resection was then performed with an oscillating saw with retractors protecting medial and laterally.   The distal cutting block was then removed and the extension gap was checked with a spacer.  Extension gap was found to be appropriately sized to accommodate the spacer block.   The femoral sizing guide was then placed securely into the posterior condyles of the femur and the femoral size was measured and determined to be 10.  The size 10; 4-in-1 cutting guide was placed in position and secured with 2 pins.  The anterior posterior and chamfer resections were then performed with an oscillating saw.  Bony fragments and osteophytes were then removed.  Using a lamina spreader the posterior medial and lateral condyles were checked for additional osteophytes and posterior soft tissue remnants.  Any remaining meniscus was removed at this time.  Periarticular injection was performed in the meniscal rims and posterior capsule with aspiration performed to ensure no intravascular injection.   The tibia was then exposed and the tibial trial was pinned onto the plateau after confirming appropriate orientation and rotation.  Using the drill bushing the tibia was prepared to the appropriate drill depth.  Tibial broach impactor was then driven through the punch guide using a mallet.  The femoral trial  component was then inserted onto the femur. A trial tibial polyethylene bearing was then placed and the knee was reduced.  The knee achieved full extension with no hyperextension and was found to be balanced in flexion and extension with the trials in place.  The knee was then brought into full extension the patella was everted and held with 2 Kocher clamps.  The articular surface of the patella was then resected with an patella reamer and saw after careful measurement with a caliper.  The patella was then prepared with the drill guide and a trial patella was placed.  The knee was then taken through range of motion and it was found that the patella articulated appropriately with the trochlea and good patellofemoral motion without subluxation.    The correct final components for implantation were confirmed and opened by the circulator nurse.  The knee was irrigated with normal saline via pulsatile lavage to remove any bony debris or soft tissue.  The prepared surface of the tibia was exposed and the tibial component was implanted with good bony contact.  The femoral component was then placed and impacted showing good coverage and a good snug fit.  The patella was then cleared off and the patella compression tool was used to apply the patellar component with symmetric compression onto the patella.  The tibial component was then irrigated and cleared of  any debris and a real polyethylene component was placed and engaged with the locking mechanism.  The knee was then injected with the particular cocktail.  The knee was taken through range of motion and found to be stable in flexion and extension with patellar tracking.  The knee was then irrigated with copious amount of normal saline via pulsatile lavage to remove all loose bodies and other debris.  The knee was then irrigated with surgiphor betadine based wash and reirrigated with saline.  The tourniquet was then dropped and all bleeding vessels were identified and  coagulated.  The arthrotomy was approximated with #1 Vicryl and closed with #1 Stratafix suture.  The knee was brought into slight flexion and the subcutaneous tissues were closed with 0 Vicryl, 2-0 Vicryl and a running subcuticular 4-0 stratafix barbed suture.  Skin was then glued with Dermabond.  A sterile adhesive dressing was then placed along with a sequential compression device to the calf, a Ted stocking, and a cryotherapy cuff.   Sponge, needle, and Lap counts were all correct at the end of the case.   The patient was transferred off of the operating room table to a hospital bed, good pulses were found distally on the operative side.  The patient was transferred to the recovery room in stable condition.

## 2023-10-11 NOTE — Anesthesia Preprocedure Evaluation (Signed)
 Anesthesia Evaluation  Patient identified by MRN, date of birth, ID band Patient awake    Reviewed: Allergy & Precautions, H&P , NPO status , Patient's Chart, lab work & pertinent test results, reviewed documented beta blocker date and time   History of Anesthesia Complications Negative for: history of anesthetic complications  Airway Mallampati: II  TM Distance: >3 FB Neck ROM: full    Dental no notable dental hx.    Pulmonary neg pulmonary ROS, neg shortness of breath, neg sleep apnea, neg COPD, neg recent URI, former smoker   Pulmonary exam normal breath sounds clear to auscultation       Cardiovascular Exercise Tolerance: Good hypertension, (-) angina + CAD and + Cardiac Stents  (-) Past MI Normal cardiovascular exam(-) dysrhythmias (-) Valvular Problems/Murmurs Rhythm:regular Rate:Normal     Neuro/Psych  PSYCHIATRIC DISORDERS Anxiety Depression    negative neurological ROS     GI/Hepatic Neg liver ROS,GERD  ,,  Endo/Other  diabetes    Renal/GU negative Renal ROS  negative genitourinary   Musculoskeletal   Abdominal   Peds  Hematology negative hematology ROS (+)   Anesthesia Other Findings Past Medical History: No date: Angina pectoris No date: Anxiety No date: Aortic atherosclerosis No date: Aortic stenosis No date: Arthritis 11/19/2004: Coronary atherosclerosis of native coronary artery     Comment:  a.) LHC/PCI 11/19/2004: 20-30% pLAD, 95% pLAD just after              1st septal (3.0 x 18 mm Cypher DES), 70% mLAD (3.5 x 13               mm Cypher DES), 80% pLCx, 60-70% oOM1, 50% dLCx --> plans              for staged PCI; b.) PCI 12/01/2004: 80% pLCx (3.0 x 18 mm              Cypher DES), 60-70% pOM1 (3.0 x 18 mm Cypher DES); c.)               LHC 07/12/2018: 60% pLAD, 35% mLAD, 30% o-pLCx, 85%               o-pRCA; 75% pRCA, 60% mRCA, 10% m-dLAD - Rx mgmt No date: DDD (degenerative disc disease),  lumbar No date: Depression No date: Diastolic dysfunction No date: Diverticulosis No date: GERD (gastroesophageal reflux disease) No date: Hepatic steatosis 06/2020: History of 2019 novel coronavirus disease (COVID-19) No date: HLD (hyperlipidemia) No date: HTN (hypertension) No date: Long-term use of aspirin  therapy (FULL DOSE) No date: Nephrolithiasis No date: OSA (obstructive sleep apnea)     Comment:  a.) no nocturnal PAP therapy 11/19/2004: Presence of stent in coronary artery     Comment:  a.) TOTAL # stents of as 10/06/2023: 4 No date: Right lower lobe pulmonary nodule No date: T2DM (type 2 diabetes mellitus) (HCC) No date: Ventral hernia No date: Wears glasses   Reproductive/Obstetrics negative OB ROS                              Anesthesia Physical Anesthesia Plan  ASA: 3  Anesthesia Plan: Spinal   Post-op Pain Management:    Induction: Intravenous  PONV Risk Score and Plan: Propofol  infusion, TIVA and Treatment may vary due to age or medical condition  Airway Management Planned: Natural Airway and Simple Face Mask  Additional Equipment:   Intra-op Plan:   Post-operative Plan:  Informed Consent: I have reviewed the patients History and Physical, chart, labs and discussed the procedure including the risks, benefits and alternatives for the proposed anesthesia with the patient or authorized representative who has indicated his/her understanding and acceptance.     Dental Advisory Given  Plan Discussed with: Anesthesiologist, CRNA and Surgeon  Anesthesia Plan Comments:          Anesthesia Quick Evaluation

## 2023-10-12 ENCOUNTER — Encounter: Payer: Self-pay | Admitting: Orthopedic Surgery

## 2023-10-12 ENCOUNTER — Other Ambulatory Visit: Payer: Self-pay

## 2023-10-12 DIAGNOSIS — M1711 Unilateral primary osteoarthritis, right knee: Secondary | ICD-10-CM | POA: Diagnosis not present

## 2023-10-12 LAB — BASIC METABOLIC PANEL WITH GFR
Anion gap: 8 (ref 5–15)
BUN: 29 mg/dL — ABNORMAL HIGH (ref 8–23)
CO2: 22 mmol/L (ref 22–32)
Calcium: 8.3 mg/dL — ABNORMAL LOW (ref 8.9–10.3)
Chloride: 107 mmol/L (ref 98–111)
Creatinine, Ser: 0.94 mg/dL (ref 0.61–1.24)
GFR, Estimated: >60 mL/min (ref >60)
Glucose, Bld: 161 mg/dL — ABNORMAL HIGH (ref 70–99)
Potassium: 4.5 mmol/L (ref 3.5–5.1)
Sodium: 137 mmol/L (ref 135–145)

## 2023-10-12 LAB — CBC
HCT: 29.6 % — ABNORMAL LOW (ref 39.0–52.0)
Hemoglobin: 10.1 g/dL — ABNORMAL LOW (ref 13.0–17.0)
MCH: 29.7 pg (ref 26.0–34.0)
MCHC: 34.1 g/dL (ref 30.0–36.0)
MCV: 87.1 fL (ref 80.0–100.0)
Platelets: 165 K/uL (ref 150–400)
RBC: 3.4 MIL/uL — ABNORMAL LOW (ref 4.22–5.81)
RDW: 12.7 % (ref 11.5–15.5)
WBC: 10.6 K/uL — ABNORMAL HIGH (ref 4.0–10.5)
nRBC: 0 % (ref 0.0–0.2)

## 2023-10-12 LAB — GLUCOSE, CAPILLARY: Glucose-Capillary: 148 mg/dL — ABNORMAL HIGH (ref 70–99)

## 2023-10-12 MED ORDER — OXYCODONE HCL 5 MG PO TABS
2.5000 mg | ORAL_TABLET | Freq: Four times a day (QID) | ORAL | 0 refills | Status: AC | PRN
  Filled 2023-10-12 (×2): qty 20, 5d supply, fill #0

## 2023-10-12 MED ORDER — ENOXAPARIN SODIUM 40 MG/0.4ML IJ SOSY
40.0000 mg | PREFILLED_SYRINGE | INTRAMUSCULAR | 0 refills | Status: AC
  Filled 2023-10-12: qty 5.6, 14d supply, fill #0

## 2023-10-12 MED ORDER — ACETAMINOPHEN 500 MG PO TABS
1000.0000 mg | ORAL_TABLET | Freq: Three times a day (TID) | ORAL | 0 refills | Status: AC
  Filled 2023-10-12: qty 30, 5d supply, fill #0

## 2023-10-12 MED ORDER — ONDANSETRON HCL 4 MG PO TABS
4.0000 mg | ORAL_TABLET | Freq: Four times a day (QID) | ORAL | 0 refills | Status: AC | PRN
  Filled 2023-10-12: qty 20, 5d supply, fill #0

## 2023-10-12 MED ORDER — CELECOXIB 200 MG PO CAPS
200.0000 mg | ORAL_CAPSULE | Freq: Two times a day (BID) | ORAL | 0 refills | Status: AC
  Filled 2023-10-12: qty 28, 14d supply, fill #0

## 2023-10-12 MED ORDER — DOCUSATE SODIUM 100 MG PO CAPS
100.0000 mg | ORAL_CAPSULE | Freq: Two times a day (BID) | ORAL | 0 refills | Status: AC
  Filled 2023-10-12: qty 10, 5d supply, fill #0

## 2023-10-12 MED ORDER — LISINOPRIL 20 MG PO TABS
ORAL_TABLET | ORAL | Status: AC
  Filled 2023-10-12: qty 1

## 2023-10-12 MED ORDER — TRAMADOL HCL 50 MG PO TABS
50.0000 mg | ORAL_TABLET | Freq: Four times a day (QID) | ORAL | 0 refills | Status: AC | PRN
  Filled 2023-10-12: qty 30, 8d supply, fill #0

## 2023-10-12 NOTE — Progress Notes (Signed)
 Subjective: 1 Day Post-Op Procedure(s) (LRB): ARTHROPLASTY, KNEE, TOTAL (Right) Patient reports pain as mild.   Patient is well, and has had no acute complaints or problems Denies any CP, SOB, ABD pain. We will continue therapy today.  Plan is to go Home after hospital stay.  Objective: Vital signs in last 24 hours: Temp:  [96.8 F (36 C)-98.2 F (36.8 C)] 98.2 F (36.8 C) (09/30 0723) Pulse Rate:  [55-71] 55 (09/30 0723) Resp:  [10-18] 15 (09/30 0723) BP: (100-152)/(58-76) 120/59 (09/30 0723) SpO2:  [92 %-99 %] 96 % (09/30 0723) Weight:  [85.7 kg] 85.7 kg (09/29 1155)  Intake/Output from previous day: 09/29 0701 - 09/30 0700 In: 2810.1 [P.O.:480; I.V.:2030.1; IV Piggyback:300] Out: 1850 [Urine:1800; Blood:50] Intake/Output this shift: No intake/output data recorded.  Recent Labs    10/12/23 0558  HGB 10.1*   Recent Labs    10/12/23 0558  WBC 10.6*  RBC 3.40*  HCT 29.6*  PLT 165   Recent Labs    10/12/23 0558  NA 137  K 4.5  CL 107  CO2 22  BUN 29*  CREATININE 0.94  GLUCOSE 161*  CALCIUM  8.3*   No results for input(s): LABPT, INR in the last 72 hours.  EXAM General - Patient is Alert, Appropriate, and Oriented Extremity - Neurovascular intact Sensation intact distally Intact pulses distally Dorsiflexion/Plantar flexion intact Dressing - dressing C/D/I and no drainage Motor Function - intact, moving foot and toes well on exam.   Past Medical History:  Diagnosis Date   Angina pectoris    Anxiety    Aortic atherosclerosis    Aortic stenosis    Arthritis    Coronary atherosclerosis of native coronary artery 11/19/2004   a.) LHC/PCI 11/19/2004: 20-30% pLAD, 95% pLAD just after 1st septal (3.0 x 18 mm Cypher DES), 70% mLAD (3.5 x 13 mm Cypher DES), 80% pLCx, 60-70% oOM1, 50% dLCx --> plans for staged PCI; b.) PCI 12/01/2004: 80% pLCx (3.0 x 18 mm Cypher DES), 60-70% pOM1 (3.0 x 18 mm Cypher DES); c.) LHC 07/12/2018: 60% pLAD, 35% mLAD, 30%  o-pLCx, 85% o-pRCA; 75% pRCA, 60% mRCA, 10% m-dLAD - Rx mgmt   DDD (degenerative disc disease), lumbar    Depression    Diastolic dysfunction    Diverticulosis    GERD (gastroesophageal reflux disease)    Hepatic steatosis    History of 2019 novel coronavirus disease (COVID-19) 06/2020   HLD (hyperlipidemia)    HTN (hypertension)    Long-term use of aspirin  therapy (FULL DOSE)    Nephrolithiasis    OSA (obstructive sleep apnea)    a.) no nocturnal PAP therapy   Presence of stent in coronary artery 11/19/2004   a.) TOTAL # stents of as 10/06/2023: 4   Right lower lobe pulmonary nodule    T2DM (type 2 diabetes mellitus) (HCC)    Ventral hernia    Wears glasses     Assessment/Plan:   1 Day Post-Op Procedure(s) (LRB): ARTHROPLASTY, KNEE, TOTAL (Right) Principal Problem:   S/P total knee arthroplasty, right  Estimated body mass index is 27.12 kg/m as calculated from the following:   Height as of this encounter: 5' 10 (1.778 m).   Weight as of this encounter: 85.7 kg. Advance diet Up with therapy Pain well-controlled Labs and vital signs are stable Care management to assist with discharge to home with home health PT today  DVT Prophylaxis - Lovenox, TED hose, and SCDs Weight-Bearing as tolerated to right leg   T. [place]  Charlene RIGGERS [facility] 10/12/2023, 8:17 AM

## 2023-10-12 NOTE — Plan of Care (Signed)
   Problem: Education: Goal: Knowledge of General Education information will improve Description Including pain rating scale, medication(s)/side effects and non-pharmacologic comfort measures Outcome: Progressing

## 2023-10-12 NOTE — TOC CM/SW Note (Cosign Needed Addendum)
 Patient is not able to walk the distance required to go the bathroom, or he/she is unable to safely negotiate stairs required to access the bathroom.  A 3in1 BSC will alleviate this problem

## 2023-10-12 NOTE — Anesthesia Postprocedure Evaluation (Signed)
 Anesthesia Post Note  Patient: Samuel Gentry  Procedure(s) Performed: ARTHROPLASTY, KNEE, TOTAL (Right: Knee)  Patient location during evaluation: Nursing Unit Anesthesia Type: Spinal Level of consciousness: awake Pain management: pain level controlled Respiratory status: spontaneous breathing Cardiovascular status: stable Postop Assessment: no headache Anesthetic complications: no   No notable events documented.   Last Vitals:  Vitals:   10/12/23 0518 10/12/23 0723  BP: (!) 100/58 (!) 120/59  Pulse: (!) 56 (!) 55  Resp: 16 15  Temp: (!) 36.1 C 36.8 C  SpO2: 96% 96%    Last Pain:  Vitals:   10/12/23 0723  TempSrc: Oral  PainSc: 4                  [name]

## 2023-10-12 NOTE — Progress Notes (Signed)
 DISCHARGE NOTE:  Pt dc with IV removed and dc instructions given. Pt received 3 in 1 and RW delivered to hospital room. Pt also received medications delivered to hospital room. Pt has both TED hose on and in place. Pt voices no questions or concerns at this time. Pt wheeled down to medical mall entrance by staff. Pt's wife provided transportation.

## 2023-10-12 NOTE — Plan of Care (Signed)
?  Problem: Education: ?Goal: Knowledge of General Education information will improve ?Description: Including pain rating scale, medication(s)/side effects and non-pharmacologic comfort measures ?Outcome: Progressing ?  ?Problem: Clinical Measurements: ?Goal: Ability to maintain clinical measurements within normal limits will improve ?Outcome: Progressing ?  ?Problem: Nutrition: ?Goal: Adequate nutrition will be maintained ?Outcome: Progressing ?  ?Problem: Elimination: ?Goal: Will not experience complications related to bowel motility ?Outcome: Progressing ?  ?

## 2023-10-12 NOTE — Progress Notes (Signed)
 Physical Therapy Treatment Patient Details Name: Samuel Gentry MRN: [id number] DOB: 1955/06/18 Today's Date: 10/12/2023   History of Present Illness Patient is a 68 year old male with right knee osteoarthritis s/p right TKA. PMH: arthritis, diabetes, CAD with stents    PT Comments  Pt was long sitting in bed upon arrival. He is A and O x 4. Endorse long night but is agreeable to session. Remains motivated and cooperative throughout.  Easily and safely able to exit bed, stand to RW, and tolerate ambulation > 100 ft with RW. Reviewed importance of routine mobility, stretching, and polar care use. Pt was also educated on car transfers and stairs. Pt demonstrated AROM ~ 88 degrees of flexion after perform several AAROM exercises. Overall, progressing well and is cleared from an acute PT standpoint for safe Dc home.    If plan is discharge home, recommend the following: Assist for transportation;Assistance with cooking/housework     Equipment Recommendations  Rolling walker (2 wheels);BSC/3in1       Precautions / Restrictions Precautions Precautions: Knee Precaution Booklet Issued: Yes (comment) Restrictions Weight Bearing Restrictions Per Provider Order: Yes RLE Weight Bearing Per Provider Order: Weight bearing as tolerated     Mobility  Bed Mobility Overal bed mobility: Needs Assistance Bed Mobility: Supine to Sit, Sit to Supine  Supine to sit: Supervision Sit to supine: Supervision   Transfers Overall transfer level: Modified independent Equipment used: Rolling walker (2 wheels) Transfers: Sit to/from Stand Sit to Stand: Supervision   Ambulation/Gait Ambulation/Gait assistance: Supervision Gait Distance (Feet): 120 Feet Assistive device: Rolling walker (2 wheels) Gait Pattern/deviations: Step-to pattern, Step-through pattern, Antalgic Gait velocity: decreased  General Gait Details: pt progress form step to gait to step through gait with vc only   Stairs Stairs:  Yes Stairs assistance: Supervision  General stair comments: discussed pt's home setup and proper sequencing    Balance Overall balance assessment: Needs assistance Sitting-balance support: Feet supported Sitting balance-Leahy Scale: Good     Standing balance support: Bilateral upper extremity supported, During functional activity, Reliant on assistive device for balance Standing balance-Leahy Scale: Fair         Hotel manager: No apparent difficulties  Cognition Arousal: Alert Behavior During Therapy: WFL for tasks assessed/performed   PT - Cognitive impairments: No apparent impairments      PT - Cognition Comments: Pt is A and O x 4 Following commands: Intact      Cueing Cueing Techniques: Verbal cues  Exercises Total Joint Exercises Ankle Circles/Pumps: AROM, 10 reps Quad Sets: AROM, 10 reps Gluteal Sets: AROM, 10 reps Heel Slides: AROM, 10 reps Hip ABduction/ADduction: AROM, 10 reps Straight Leg Raises: AROM, 10 reps    General Comments General comments (skin integrity, edema, etc.): Issued HEP handout, educated on stairs, car transfers, importance of stretching and polar care use      Pertinent Vitals/Pain Pain Assessment Pain Assessment: 0-10 Pain Score: 5  Pain Location: right knee Pain Descriptors / Indicators: Discomfort Pain Intervention(s): Limited activity within patient's tolerance, Monitored during session, Premedicated before session, Repositioned     PT Goals (current goals can now be found in the care plan section) Acute Rehab PT Goals Patient Stated Goal: to go home and eventually back to work Progress towards PT goals: Progressing toward goals    Frequency    BID       AM-PAC PT 6 Clicks Mobility   Outcome Measure  Help needed turning from your back to your side while in a  flat bed without using bedrails?: None Help needed moving from lying on your back to sitting on the side of a flat bed without  using bedrails?: A Little Help needed moving to and from a bed to a chair (including a wheelchair)?: A Little Help needed standing up from a chair using your arms (e.g., wheelchair or bedside chair)?: A Little Help needed to walk in hospital room?: A Little Help needed climbing 3-5 steps with a railing? : A Little 6 Click Score: 19    End of Session   Activity Tolerance: Patient tolerated treatment well Patient left: in chair;with call bell/phone within reach;with chair alarm set Nurse Communication: Mobility status PT Visit Diagnosis: Other abnormalities of gait and mobility (R26.89);Difficulty in walking, not elsewhere classified (R26.2)     Time: 9265-9191 PT Time Calculation (min) (ACUTE ONLY): 34 min  Charges:    $Gait Training: 8-22 mins $Therapeutic Exercise: 8-22 mins PT General Charges $$ ACUTE PT VISIT: 1 Visit                    Rankin Essex PTA 10/12/23, 8:33 AM

## 2023-12-06 ENCOUNTER — Other Ambulatory Visit (HOSPITAL_COMMUNITY): Payer: Self-pay
